# Patient Record
Sex: Male | Born: 1935 | Race: Black or African American | Hispanic: No
Health system: Southern US, Community
[De-identification: ages and names within clinical notes are randomized; demographics above are authoritative.]

## PROBLEM LIST (undated history)

## (undated) DIAGNOSIS — I639 Cerebral infarction, unspecified: Secondary | ICD-10-CM

## (undated) DIAGNOSIS — E785 Hyperlipidemia, unspecified: Secondary | ICD-10-CM

## (undated) DIAGNOSIS — I10 Essential (primary) hypertension: Secondary | ICD-10-CM

## (undated) DIAGNOSIS — I2510 Atherosclerotic heart disease of native coronary artery without angina pectoris: Secondary | ICD-10-CM

## (undated) DIAGNOSIS — N400 Enlarged prostate without lower urinary tract symptoms: Secondary | ICD-10-CM

## (undated) DIAGNOSIS — I219 Acute myocardial infarction, unspecified: Secondary | ICD-10-CM

## (undated) DIAGNOSIS — G8190 Hemiplegia, unspecified affecting unspecified side: Secondary | ICD-10-CM

## (undated) DIAGNOSIS — G9340 Encephalopathy, unspecified: Secondary | ICD-10-CM

## (undated) DIAGNOSIS — I208 Other forms of angina pectoris: Secondary | ICD-10-CM

## (undated) DIAGNOSIS — I2089 Other forms of angina pectoris: Secondary | ICD-10-CM

---

## 2004-06-24 ENCOUNTER — Emergency Department: Payer: Self-pay | Admitting: Emergency Medicine

## 2004-06-24 ENCOUNTER — Other Ambulatory Visit: Payer: Self-pay

## 2004-10-18 ENCOUNTER — Ambulatory Visit: Payer: Self-pay | Admitting: Internal Medicine

## 2005-10-17 ENCOUNTER — Ambulatory Visit: Payer: Self-pay | Admitting: Internal Medicine

## 2010-12-01 ENCOUNTER — Ambulatory Visit: Payer: Self-pay | Admitting: Internal Medicine

## 2010-12-28 ENCOUNTER — Ambulatory Visit: Payer: Self-pay | Admitting: Internal Medicine

## 2013-11-19 ENCOUNTER — Ambulatory Visit: Payer: Self-pay | Admitting: Nephrology

## 2020-08-06 DIAGNOSIS — I2510 Atherosclerotic heart disease of native coronary artery without angina pectoris: Secondary | ICD-10-CM | POA: Diagnosis not present

## 2020-08-06 DIAGNOSIS — I10 Essential (primary) hypertension: Secondary | ICD-10-CM | POA: Diagnosis not present

## 2020-08-06 DIAGNOSIS — E559 Vitamin D deficiency, unspecified: Secondary | ICD-10-CM | POA: Diagnosis not present

## 2020-08-11 DIAGNOSIS — M1389 Other specified arthritis, multiple sites: Secondary | ICD-10-CM | POA: Diagnosis not present

## 2020-08-11 DIAGNOSIS — N181 Chronic kidney disease, stage 1: Secondary | ICD-10-CM | POA: Diagnosis not present

## 2020-08-11 DIAGNOSIS — E785 Hyperlipidemia, unspecified: Secondary | ICD-10-CM | POA: Diagnosis not present

## 2020-08-11 DIAGNOSIS — E559 Vitamin D deficiency, unspecified: Secondary | ICD-10-CM | POA: Diagnosis not present

## 2020-08-11 DIAGNOSIS — J449 Chronic obstructive pulmonary disease, unspecified: Secondary | ICD-10-CM | POA: Diagnosis not present

## 2020-11-26 DIAGNOSIS — H903 Sensorineural hearing loss, bilateral: Secondary | ICD-10-CM | POA: Diagnosis not present

## 2020-12-01 DIAGNOSIS — H905 Unspecified sensorineural hearing loss: Secondary | ICD-10-CM | POA: Diagnosis not present

## 2020-12-03 DIAGNOSIS — N181 Chronic kidney disease, stage 1: Secondary | ICD-10-CM | POA: Diagnosis not present

## 2020-12-03 DIAGNOSIS — E785 Hyperlipidemia, unspecified: Secondary | ICD-10-CM | POA: Diagnosis not present

## 2020-12-03 DIAGNOSIS — E559 Vitamin D deficiency, unspecified: Secondary | ICD-10-CM | POA: Diagnosis not present

## 2020-12-08 DIAGNOSIS — N181 Chronic kidney disease, stage 1: Secondary | ICD-10-CM | POA: Diagnosis not present

## 2020-12-08 DIAGNOSIS — M1389 Other specified arthritis, multiple sites: Secondary | ICD-10-CM | POA: Diagnosis not present

## 2020-12-08 DIAGNOSIS — M179 Osteoarthritis of knee, unspecified: Secondary | ICD-10-CM | POA: Diagnosis not present

## 2020-12-08 DIAGNOSIS — E559 Vitamin D deficiency, unspecified: Secondary | ICD-10-CM | POA: Diagnosis not present

## 2020-12-08 DIAGNOSIS — E785 Hyperlipidemia, unspecified: Secondary | ICD-10-CM | POA: Diagnosis not present

## 2020-12-25 DIAGNOSIS — Z20822 Contact with and (suspected) exposure to covid-19: Secondary | ICD-10-CM | POA: Diagnosis not present

## 2020-12-25 DIAGNOSIS — Z9189 Other specified personal risk factors, not elsewhere classified: Secondary | ICD-10-CM | POA: Diagnosis not present

## 2020-12-25 DIAGNOSIS — F32A Depression, unspecified: Secondary | ICD-10-CM | POA: Diagnosis not present

## 2020-12-25 DIAGNOSIS — I493 Ventricular premature depolarization: Secondary | ICD-10-CM | POA: Diagnosis not present

## 2020-12-25 DIAGNOSIS — R531 Weakness: Secondary | ICD-10-CM | POA: Diagnosis not present

## 2020-12-25 DIAGNOSIS — I69354 Hemiplegia and hemiparesis following cerebral infarction affecting left non-dominant side: Secondary | ICD-10-CM | POA: Diagnosis not present

## 2020-12-25 DIAGNOSIS — I252 Old myocardial infarction: Secondary | ICD-10-CM | POA: Diagnosis not present

## 2020-12-25 DIAGNOSIS — R451 Restlessness and agitation: Secondary | ICD-10-CM | POA: Diagnosis not present

## 2020-12-25 DIAGNOSIS — I110 Hypertensive heart disease with heart failure: Secondary | ICD-10-CM | POA: Diagnosis not present

## 2020-12-25 DIAGNOSIS — Z79899 Other long term (current) drug therapy: Secondary | ICD-10-CM | POA: Diagnosis not present

## 2020-12-25 DIAGNOSIS — I10 Essential (primary) hypertension: Secondary | ICD-10-CM | POA: Diagnosis not present

## 2020-12-25 DIAGNOSIS — Z9911 Dependence on respirator [ventilator] status: Secondary | ICD-10-CM | POA: Diagnosis not present

## 2020-12-25 DIAGNOSIS — Z7901 Long term (current) use of anticoagulants: Secondary | ICD-10-CM | POA: Diagnosis not present

## 2020-12-25 DIAGNOSIS — I161 Hypertensive emergency: Secondary | ICD-10-CM | POA: Diagnosis not present

## 2020-12-25 DIAGNOSIS — R748 Abnormal levels of other serum enzymes: Secondary | ICD-10-CM | POA: Diagnosis not present

## 2020-12-25 DIAGNOSIS — I248 Other forms of acute ischemic heart disease: Secondary | ICD-10-CM | POA: Diagnosis not present

## 2020-12-25 DIAGNOSIS — R471 Dysarthria and anarthria: Secondary | ICD-10-CM | POA: Diagnosis not present

## 2020-12-25 DIAGNOSIS — E7800 Pure hypercholesterolemia, unspecified: Secondary | ICD-10-CM | POA: Diagnosis not present

## 2020-12-25 DIAGNOSIS — Z9181 History of falling: Secondary | ICD-10-CM | POA: Diagnosis not present

## 2020-12-25 DIAGNOSIS — Z880 Allergy status to penicillin: Secondary | ICD-10-CM | POA: Diagnosis not present

## 2020-12-25 DIAGNOSIS — M6281 Muscle weakness (generalized): Secondary | ICD-10-CM | POA: Diagnosis not present

## 2020-12-25 DIAGNOSIS — G8194 Hemiplegia, unspecified affecting left nondominant side: Secondary | ICD-10-CM | POA: Diagnosis not present

## 2020-12-25 DIAGNOSIS — I5022 Chronic systolic (congestive) heart failure: Secondary | ICD-10-CM | POA: Diagnosis not present

## 2020-12-25 DIAGNOSIS — R29714 NIHSS score 14: Secondary | ICD-10-CM | POA: Diagnosis not present

## 2020-12-25 DIAGNOSIS — R0989 Other specified symptoms and signs involving the circulatory and respiratory systems: Secondary | ICD-10-CM | POA: Diagnosis not present

## 2020-12-25 DIAGNOSIS — Z7982 Long term (current) use of aspirin: Secondary | ICD-10-CM | POA: Diagnosis not present

## 2020-12-25 DIAGNOSIS — I69391 Dysphagia following cerebral infarction: Secondary | ICD-10-CM | POA: Diagnosis not present

## 2020-12-25 DIAGNOSIS — I63411 Cerebral infarction due to embolism of right middle cerebral artery: Secondary | ICD-10-CM | POA: Diagnosis not present

## 2020-12-25 DIAGNOSIS — I25118 Atherosclerotic heart disease of native coronary artery with other forms of angina pectoris: Secondary | ICD-10-CM | POA: Diagnosis not present

## 2020-12-25 DIAGNOSIS — I6601 Occlusion and stenosis of right middle cerebral artery: Secondary | ICD-10-CM | POA: Diagnosis not present

## 2020-12-25 DIAGNOSIS — I214 Non-ST elevation (NSTEMI) myocardial infarction: Secondary | ICD-10-CM | POA: Diagnosis not present

## 2020-12-25 DIAGNOSIS — I253 Aneurysm of heart: Secondary | ICD-10-CM | POA: Diagnosis not present

## 2020-12-25 DIAGNOSIS — I519 Heart disease, unspecified: Secondary | ICD-10-CM | POA: Diagnosis not present

## 2020-12-25 DIAGNOSIS — R5381 Other malaise: Secondary | ICD-10-CM | POA: Diagnosis not present

## 2020-12-25 DIAGNOSIS — I639 Cerebral infarction, unspecified: Secondary | ICD-10-CM | POA: Diagnosis not present

## 2020-12-25 DIAGNOSIS — Z955 Presence of coronary angioplasty implant and graft: Secondary | ICD-10-CM | POA: Diagnosis not present

## 2020-12-25 DIAGNOSIS — R1312 Dysphagia, oropharyngeal phase: Secondary | ICD-10-CM | POA: Diagnosis not present

## 2020-12-25 DIAGNOSIS — J45909 Unspecified asthma, uncomplicated: Secondary | ICD-10-CM | POA: Diagnosis not present

## 2020-12-25 DIAGNOSIS — R6889 Other general symptoms and signs: Secondary | ICD-10-CM | POA: Diagnosis not present

## 2020-12-25 DIAGNOSIS — R410 Disorientation, unspecified: Secondary | ICD-10-CM | POA: Diagnosis not present

## 2020-12-25 DIAGNOSIS — E785 Hyperlipidemia, unspecified: Secondary | ICD-10-CM | POA: Diagnosis not present

## 2020-12-25 DIAGNOSIS — I2510 Atherosclerotic heart disease of native coronary artery without angina pectoris: Secondary | ICD-10-CM | POA: Diagnosis not present

## 2020-12-25 DIAGNOSIS — Z8639 Personal history of other endocrine, nutritional and metabolic disease: Secondary | ICD-10-CM | POA: Diagnosis not present

## 2020-12-25 DIAGNOSIS — R1310 Dysphagia, unspecified: Secondary | ICD-10-CM | POA: Diagnosis not present

## 2020-12-25 DIAGNOSIS — Z743 Need for continuous supervision: Secondary | ICD-10-CM | POA: Diagnosis not present

## 2020-12-25 DIAGNOSIS — I69314 Frontal lobe and executive function deficit following cerebral infarction: Secondary | ICD-10-CM | POA: Diagnosis not present

## 2020-12-25 DIAGNOSIS — I499 Cardiac arrhythmia, unspecified: Secondary | ICD-10-CM | POA: Diagnosis not present

## 2020-12-25 DIAGNOSIS — R29810 Facial weakness: Secondary | ICD-10-CM | POA: Diagnosis not present

## 2020-12-25 DIAGNOSIS — I69398 Other sequelae of cerebral infarction: Secondary | ICD-10-CM | POA: Diagnosis not present

## 2020-12-25 DIAGNOSIS — Z4682 Encounter for fitting and adjustment of non-vascular catheter: Secondary | ICD-10-CM | POA: Diagnosis not present

## 2020-12-25 DIAGNOSIS — Z8679 Personal history of other diseases of the circulatory system: Secondary | ICD-10-CM | POA: Diagnosis not present

## 2020-12-25 DIAGNOSIS — R2689 Other abnormalities of gait and mobility: Secondary | ICD-10-CM | POA: Diagnosis not present

## 2020-12-25 DIAGNOSIS — I726 Aneurysm of vertebral artery: Secondary | ICD-10-CM | POA: Diagnosis not present

## 2020-12-25 DIAGNOSIS — I213 ST elevation (STEMI) myocardial infarction of unspecified site: Secondary | ICD-10-CM | POA: Diagnosis not present

## 2020-12-25 DIAGNOSIS — R9431 Abnormal electrocardiogram [ECG] [EKG]: Secondary | ICD-10-CM | POA: Diagnosis not present

## 2020-12-25 DIAGNOSIS — Z789 Other specified health status: Secondary | ICD-10-CM | POA: Diagnosis not present

## 2020-12-25 DIAGNOSIS — I509 Heart failure, unspecified: Secondary | ICD-10-CM | POA: Diagnosis not present

## 2020-12-25 DIAGNOSIS — I63511 Cerebral infarction due to unspecified occlusion or stenosis of right middle cerebral artery: Secondary | ICD-10-CM | POA: Diagnosis not present

## 2021-01-07 DIAGNOSIS — Z743 Need for continuous supervision: Secondary | ICD-10-CM | POA: Diagnosis not present

## 2021-01-07 DIAGNOSIS — I214 Non-ST elevation (NSTEMI) myocardial infarction: Secondary | ICD-10-CM | POA: Diagnosis not present

## 2021-01-07 DIAGNOSIS — J449 Chronic obstructive pulmonary disease, unspecified: Secondary | ICD-10-CM | POA: Diagnosis not present

## 2021-01-07 DIAGNOSIS — R970 Elevated carcinoembryonic antigen [CEA]: Secondary | ICD-10-CM | POA: Diagnosis not present

## 2021-01-07 DIAGNOSIS — F32A Depression, unspecified: Secondary | ICD-10-CM | POA: Diagnosis not present

## 2021-01-07 DIAGNOSIS — I69354 Hemiplegia and hemiparesis following cerebral infarction affecting left non-dominant side: Secondary | ICD-10-CM | POA: Diagnosis not present

## 2021-01-07 DIAGNOSIS — G9340 Encephalopathy, unspecified: Secondary | ICD-10-CM | POA: Diagnosis not present

## 2021-01-07 DIAGNOSIS — E7800 Pure hypercholesterolemia, unspecified: Secondary | ICD-10-CM | POA: Diagnosis not present

## 2021-01-07 DIAGNOSIS — R799 Abnormal finding of blood chemistry, unspecified: Secondary | ICD-10-CM | POA: Diagnosis not present

## 2021-01-07 DIAGNOSIS — I10 Essential (primary) hypertension: Secondary | ICD-10-CM | POA: Diagnosis not present

## 2021-01-07 DIAGNOSIS — R7989 Other specified abnormal findings of blood chemistry: Secondary | ICD-10-CM | POA: Diagnosis not present

## 2021-01-07 DIAGNOSIS — E039 Hypothyroidism, unspecified: Secondary | ICD-10-CM | POA: Diagnosis not present

## 2021-01-07 DIAGNOSIS — I25118 Atherosclerotic heart disease of native coronary artery with other forms of angina pectoris: Secondary | ICD-10-CM | POA: Diagnosis not present

## 2021-01-07 DIAGNOSIS — M6281 Muscle weakness (generalized): Secondary | ICD-10-CM | POA: Diagnosis not present

## 2021-01-07 DIAGNOSIS — I69314 Frontal lobe and executive function deficit following cerebral infarction: Secondary | ICD-10-CM | POA: Diagnosis not present

## 2021-01-07 DIAGNOSIS — R410 Disorientation, unspecified: Secondary | ICD-10-CM | POA: Diagnosis not present

## 2021-01-07 DIAGNOSIS — I69398 Other sequelae of cerebral infarction: Secondary | ICD-10-CM | POA: Diagnosis not present

## 2021-01-07 DIAGNOSIS — I63511 Cerebral infarction due to unspecified occlusion or stenosis of right middle cerebral artery: Secondary | ICD-10-CM | POA: Diagnosis not present

## 2021-01-07 DIAGNOSIS — I509 Heart failure, unspecified: Secondary | ICD-10-CM | POA: Diagnosis not present

## 2021-01-07 DIAGNOSIS — G8194 Hemiplegia, unspecified affecting left nondominant side: Secondary | ICD-10-CM | POA: Diagnosis not present

## 2021-01-07 DIAGNOSIS — Z7982 Long term (current) use of aspirin: Secondary | ICD-10-CM | POA: Diagnosis not present

## 2021-01-07 DIAGNOSIS — I69391 Dysphagia following cerebral infarction: Secondary | ICD-10-CM | POA: Diagnosis not present

## 2021-01-07 DIAGNOSIS — R0989 Other specified symptoms and signs involving the circulatory and respiratory systems: Secondary | ICD-10-CM | POA: Diagnosis not present

## 2021-01-07 DIAGNOSIS — R1312 Dysphagia, oropharyngeal phase: Secondary | ICD-10-CM | POA: Diagnosis not present

## 2021-01-08 DIAGNOSIS — I25118 Atherosclerotic heart disease of native coronary artery with other forms of angina pectoris: Secondary | ICD-10-CM | POA: Diagnosis not present

## 2021-01-08 DIAGNOSIS — G8194 Hemiplegia, unspecified affecting left nondominant side: Secondary | ICD-10-CM | POA: Insufficient documentation

## 2021-01-11 DIAGNOSIS — G9340 Encephalopathy, unspecified: Secondary | ICD-10-CM | POA: Diagnosis not present

## 2021-01-11 DIAGNOSIS — G8194 Hemiplegia, unspecified affecting left nondominant side: Secondary | ICD-10-CM | POA: Diagnosis not present

## 2021-01-29 DIAGNOSIS — I10 Essential (primary) hypertension: Secondary | ICD-10-CM | POA: Diagnosis not present

## 2021-01-29 DIAGNOSIS — M6281 Muscle weakness (generalized): Secondary | ICD-10-CM | POA: Diagnosis not present

## 2021-01-29 DIAGNOSIS — I69314 Frontal lobe and executive function deficit following cerebral infarction: Secondary | ICD-10-CM | POA: Diagnosis not present

## 2021-01-29 DIAGNOSIS — I69828 Other speech and language deficits following other cerebrovascular disease: Secondary | ICD-10-CM | POA: Diagnosis not present

## 2021-01-29 DIAGNOSIS — I69354 Hemiplegia and hemiparesis following cerebral infarction affecting left non-dominant side: Secondary | ICD-10-CM | POA: Diagnosis not present

## 2021-01-29 DIAGNOSIS — I69322 Dysarthria following cerebral infarction: Secondary | ICD-10-CM | POA: Diagnosis not present

## 2021-01-29 DIAGNOSIS — I69398 Other sequelae of cerebral infarction: Secondary | ICD-10-CM | POA: Diagnosis not present

## 2021-01-29 DIAGNOSIS — R1312 Dysphagia, oropharyngeal phase: Secondary | ICD-10-CM | POA: Diagnosis not present

## 2021-01-29 DIAGNOSIS — I509 Heart failure, unspecified: Secondary | ICD-10-CM | POA: Diagnosis not present

## 2021-01-29 DIAGNOSIS — I69391 Dysphagia following cerebral infarction: Secondary | ICD-10-CM | POA: Diagnosis not present

## 2021-02-01 DIAGNOSIS — I69398 Other sequelae of cerebral infarction: Secondary | ICD-10-CM | POA: Diagnosis not present

## 2021-02-01 DIAGNOSIS — I509 Heart failure, unspecified: Secondary | ICD-10-CM | POA: Diagnosis not present

## 2021-02-01 DIAGNOSIS — I69322 Dysarthria following cerebral infarction: Secondary | ICD-10-CM | POA: Diagnosis not present

## 2021-02-01 DIAGNOSIS — I69314 Frontal lobe and executive function deficit following cerebral infarction: Secondary | ICD-10-CM | POA: Diagnosis not present

## 2021-02-01 DIAGNOSIS — M6281 Muscle weakness (generalized): Secondary | ICD-10-CM | POA: Diagnosis not present

## 2021-02-01 DIAGNOSIS — R1312 Dysphagia, oropharyngeal phase: Secondary | ICD-10-CM | POA: Diagnosis not present

## 2021-02-01 DIAGNOSIS — I10 Essential (primary) hypertension: Secondary | ICD-10-CM | POA: Diagnosis not present

## 2021-02-01 DIAGNOSIS — I69391 Dysphagia following cerebral infarction: Secondary | ICD-10-CM | POA: Diagnosis not present

## 2021-02-01 DIAGNOSIS — I69354 Hemiplegia and hemiparesis following cerebral infarction affecting left non-dominant side: Secondary | ICD-10-CM | POA: Diagnosis not present

## 2021-02-01 DIAGNOSIS — I69828 Other speech and language deficits following other cerebrovascular disease: Secondary | ICD-10-CM | POA: Diagnosis not present

## 2021-02-02 DIAGNOSIS — I69322 Dysarthria following cerebral infarction: Secondary | ICD-10-CM | POA: Diagnosis not present

## 2021-02-02 DIAGNOSIS — I69398 Other sequelae of cerebral infarction: Secondary | ICD-10-CM | POA: Diagnosis not present

## 2021-02-02 DIAGNOSIS — I69391 Dysphagia following cerebral infarction: Secondary | ICD-10-CM | POA: Diagnosis not present

## 2021-02-02 DIAGNOSIS — I10 Essential (primary) hypertension: Secondary | ICD-10-CM | POA: Diagnosis not present

## 2021-02-02 DIAGNOSIS — I739 Peripheral vascular disease, unspecified: Secondary | ICD-10-CM | POA: Diagnosis not present

## 2021-02-02 DIAGNOSIS — I509 Heart failure, unspecified: Secondary | ICD-10-CM | POA: Diagnosis not present

## 2021-02-02 DIAGNOSIS — I69354 Hemiplegia and hemiparesis following cerebral infarction affecting left non-dominant side: Secondary | ICD-10-CM | POA: Diagnosis not present

## 2021-02-02 DIAGNOSIS — R1312 Dysphagia, oropharyngeal phase: Secondary | ICD-10-CM | POA: Diagnosis not present

## 2021-02-02 DIAGNOSIS — M6281 Muscle weakness (generalized): Secondary | ICD-10-CM | POA: Diagnosis not present

## 2021-02-02 DIAGNOSIS — I69828 Other speech and language deficits following other cerebrovascular disease: Secondary | ICD-10-CM | POA: Diagnosis not present

## 2021-02-02 DIAGNOSIS — N1831 Chronic kidney disease, stage 3a: Secondary | ICD-10-CM | POA: Diagnosis not present

## 2021-02-02 DIAGNOSIS — K219 Gastro-esophageal reflux disease without esophagitis: Secondary | ICD-10-CM | POA: Diagnosis not present

## 2021-02-02 DIAGNOSIS — I69314 Frontal lobe and executive function deficit following cerebral infarction: Secondary | ICD-10-CM | POA: Diagnosis not present

## 2021-02-08 DIAGNOSIS — I69398 Other sequelae of cerebral infarction: Secondary | ICD-10-CM | POA: Diagnosis not present

## 2021-02-08 DIAGNOSIS — I69354 Hemiplegia and hemiparesis following cerebral infarction affecting left non-dominant side: Secondary | ICD-10-CM | POA: Diagnosis not present

## 2021-02-08 DIAGNOSIS — I69391 Dysphagia following cerebral infarction: Secondary | ICD-10-CM | POA: Diagnosis not present

## 2021-02-08 DIAGNOSIS — R1312 Dysphagia, oropharyngeal phase: Secondary | ICD-10-CM | POA: Diagnosis not present

## 2021-02-08 DIAGNOSIS — I69828 Other speech and language deficits following other cerebrovascular disease: Secondary | ICD-10-CM | POA: Diagnosis not present

## 2021-02-08 DIAGNOSIS — I509 Heart failure, unspecified: Secondary | ICD-10-CM | POA: Diagnosis not present

## 2021-02-08 DIAGNOSIS — M6281 Muscle weakness (generalized): Secondary | ICD-10-CM | POA: Diagnosis not present

## 2021-02-08 DIAGNOSIS — I69314 Frontal lobe and executive function deficit following cerebral infarction: Secondary | ICD-10-CM | POA: Diagnosis not present

## 2021-02-08 DIAGNOSIS — I69322 Dysarthria following cerebral infarction: Secondary | ICD-10-CM | POA: Diagnosis not present

## 2021-02-08 DIAGNOSIS — I10 Essential (primary) hypertension: Secondary | ICD-10-CM | POA: Diagnosis not present

## 2021-02-09 DIAGNOSIS — R1312 Dysphagia, oropharyngeal phase: Secondary | ICD-10-CM | POA: Diagnosis not present

## 2021-02-09 DIAGNOSIS — I509 Heart failure, unspecified: Secondary | ICD-10-CM | POA: Diagnosis not present

## 2021-02-09 DIAGNOSIS — I69354 Hemiplegia and hemiparesis following cerebral infarction affecting left non-dominant side: Secondary | ICD-10-CM | POA: Diagnosis not present

## 2021-02-09 DIAGNOSIS — M6281 Muscle weakness (generalized): Secondary | ICD-10-CM | POA: Diagnosis not present

## 2021-02-09 DIAGNOSIS — I69391 Dysphagia following cerebral infarction: Secondary | ICD-10-CM | POA: Diagnosis not present

## 2021-02-09 DIAGNOSIS — I10 Essential (primary) hypertension: Secondary | ICD-10-CM | POA: Diagnosis not present

## 2021-02-09 DIAGNOSIS — I69828 Other speech and language deficits following other cerebrovascular disease: Secondary | ICD-10-CM | POA: Diagnosis not present

## 2021-02-09 DIAGNOSIS — I69322 Dysarthria following cerebral infarction: Secondary | ICD-10-CM | POA: Diagnosis not present

## 2021-02-09 DIAGNOSIS — I69398 Other sequelae of cerebral infarction: Secondary | ICD-10-CM | POA: Diagnosis not present

## 2021-02-09 DIAGNOSIS — I69314 Frontal lobe and executive function deficit following cerebral infarction: Secondary | ICD-10-CM | POA: Diagnosis not present

## 2021-02-10 DIAGNOSIS — I69354 Hemiplegia and hemiparesis following cerebral infarction affecting left non-dominant side: Secondary | ICD-10-CM | POA: Diagnosis not present

## 2021-02-10 DIAGNOSIS — I69391 Dysphagia following cerebral infarction: Secondary | ICD-10-CM | POA: Diagnosis not present

## 2021-02-10 DIAGNOSIS — R1312 Dysphagia, oropharyngeal phase: Secondary | ICD-10-CM | POA: Diagnosis not present

## 2021-02-10 DIAGNOSIS — I69398 Other sequelae of cerebral infarction: Secondary | ICD-10-CM | POA: Diagnosis not present

## 2021-02-10 DIAGNOSIS — I69314 Frontal lobe and executive function deficit following cerebral infarction: Secondary | ICD-10-CM | POA: Diagnosis not present

## 2021-02-10 DIAGNOSIS — M6281 Muscle weakness (generalized): Secondary | ICD-10-CM | POA: Diagnosis not present

## 2021-02-10 DIAGNOSIS — I509 Heart failure, unspecified: Secondary | ICD-10-CM | POA: Diagnosis not present

## 2021-02-10 DIAGNOSIS — I10 Essential (primary) hypertension: Secondary | ICD-10-CM | POA: Diagnosis not present

## 2021-02-10 DIAGNOSIS — I69828 Other speech and language deficits following other cerebrovascular disease: Secondary | ICD-10-CM | POA: Diagnosis not present

## 2021-02-10 DIAGNOSIS — I69322 Dysarthria following cerebral infarction: Secondary | ICD-10-CM | POA: Diagnosis not present

## 2021-02-11 DIAGNOSIS — I509 Heart failure, unspecified: Secondary | ICD-10-CM | POA: Diagnosis not present

## 2021-02-11 DIAGNOSIS — I69828 Other speech and language deficits following other cerebrovascular disease: Secondary | ICD-10-CM | POA: Diagnosis not present

## 2021-02-11 DIAGNOSIS — I10 Essential (primary) hypertension: Secondary | ICD-10-CM | POA: Diagnosis not present

## 2021-02-11 DIAGNOSIS — I69322 Dysarthria following cerebral infarction: Secondary | ICD-10-CM | POA: Diagnosis not present

## 2021-02-11 DIAGNOSIS — I69354 Hemiplegia and hemiparesis following cerebral infarction affecting left non-dominant side: Secondary | ICD-10-CM | POA: Diagnosis not present

## 2021-02-11 DIAGNOSIS — I69314 Frontal lobe and executive function deficit following cerebral infarction: Secondary | ICD-10-CM | POA: Diagnosis not present

## 2021-02-11 DIAGNOSIS — R1312 Dysphagia, oropharyngeal phase: Secondary | ICD-10-CM | POA: Diagnosis not present

## 2021-02-11 DIAGNOSIS — M6281 Muscle weakness (generalized): Secondary | ICD-10-CM | POA: Diagnosis not present

## 2021-02-11 DIAGNOSIS — I69398 Other sequelae of cerebral infarction: Secondary | ICD-10-CM | POA: Diagnosis not present

## 2021-02-11 DIAGNOSIS — I69391 Dysphagia following cerebral infarction: Secondary | ICD-10-CM | POA: Diagnosis not present

## 2021-02-12 DIAGNOSIS — I69828 Other speech and language deficits following other cerebrovascular disease: Secondary | ICD-10-CM | POA: Diagnosis not present

## 2021-02-12 DIAGNOSIS — I69391 Dysphagia following cerebral infarction: Secondary | ICD-10-CM | POA: Diagnosis not present

## 2021-02-12 DIAGNOSIS — I509 Heart failure, unspecified: Secondary | ICD-10-CM | POA: Diagnosis not present

## 2021-02-12 DIAGNOSIS — I69398 Other sequelae of cerebral infarction: Secondary | ICD-10-CM | POA: Diagnosis not present

## 2021-02-12 DIAGNOSIS — I10 Essential (primary) hypertension: Secondary | ICD-10-CM | POA: Diagnosis not present

## 2021-02-12 DIAGNOSIS — M6281 Muscle weakness (generalized): Secondary | ICD-10-CM | POA: Diagnosis not present

## 2021-02-12 DIAGNOSIS — R1312 Dysphagia, oropharyngeal phase: Secondary | ICD-10-CM | POA: Diagnosis not present

## 2021-02-12 DIAGNOSIS — I69354 Hemiplegia and hemiparesis following cerebral infarction affecting left non-dominant side: Secondary | ICD-10-CM | POA: Diagnosis not present

## 2021-02-12 DIAGNOSIS — I69314 Frontal lobe and executive function deficit following cerebral infarction: Secondary | ICD-10-CM | POA: Diagnosis not present

## 2021-02-12 DIAGNOSIS — I69322 Dysarthria following cerebral infarction: Secondary | ICD-10-CM | POA: Diagnosis not present

## 2021-02-15 DIAGNOSIS — I69354 Hemiplegia and hemiparesis following cerebral infarction affecting left non-dominant side: Secondary | ICD-10-CM | POA: Diagnosis not present

## 2021-02-15 DIAGNOSIS — I69398 Other sequelae of cerebral infarction: Secondary | ICD-10-CM | POA: Diagnosis not present

## 2021-02-15 DIAGNOSIS — I509 Heart failure, unspecified: Secondary | ICD-10-CM | POA: Diagnosis not present

## 2021-02-15 DIAGNOSIS — I10 Essential (primary) hypertension: Secondary | ICD-10-CM | POA: Diagnosis not present

## 2021-02-15 DIAGNOSIS — R1312 Dysphagia, oropharyngeal phase: Secondary | ICD-10-CM | POA: Diagnosis not present

## 2021-02-15 DIAGNOSIS — I69828 Other speech and language deficits following other cerebrovascular disease: Secondary | ICD-10-CM | POA: Diagnosis not present

## 2021-02-15 DIAGNOSIS — I69322 Dysarthria following cerebral infarction: Secondary | ICD-10-CM | POA: Diagnosis not present

## 2021-02-15 DIAGNOSIS — I69314 Frontal lobe and executive function deficit following cerebral infarction: Secondary | ICD-10-CM | POA: Diagnosis not present

## 2021-02-15 DIAGNOSIS — I69391 Dysphagia following cerebral infarction: Secondary | ICD-10-CM | POA: Diagnosis not present

## 2021-02-15 DIAGNOSIS — M6281 Muscle weakness (generalized): Secondary | ICD-10-CM | POA: Diagnosis not present

## 2021-02-16 DIAGNOSIS — I69314 Frontal lobe and executive function deficit following cerebral infarction: Secondary | ICD-10-CM | POA: Diagnosis not present

## 2021-02-16 DIAGNOSIS — I69828 Other speech and language deficits following other cerebrovascular disease: Secondary | ICD-10-CM | POA: Diagnosis not present

## 2021-02-16 DIAGNOSIS — I69354 Hemiplegia and hemiparesis following cerebral infarction affecting left non-dominant side: Secondary | ICD-10-CM | POA: Diagnosis not present

## 2021-02-16 DIAGNOSIS — I69391 Dysphagia following cerebral infarction: Secondary | ICD-10-CM | POA: Diagnosis not present

## 2021-02-16 DIAGNOSIS — I69398 Other sequelae of cerebral infarction: Secondary | ICD-10-CM | POA: Diagnosis not present

## 2021-02-16 DIAGNOSIS — I509 Heart failure, unspecified: Secondary | ICD-10-CM | POA: Diagnosis not present

## 2021-02-16 DIAGNOSIS — R1312 Dysphagia, oropharyngeal phase: Secondary | ICD-10-CM | POA: Diagnosis not present

## 2021-02-16 DIAGNOSIS — I10 Essential (primary) hypertension: Secondary | ICD-10-CM | POA: Diagnosis not present

## 2021-02-16 DIAGNOSIS — M6281 Muscle weakness (generalized): Secondary | ICD-10-CM | POA: Diagnosis not present

## 2021-02-16 DIAGNOSIS — I69322 Dysarthria following cerebral infarction: Secondary | ICD-10-CM | POA: Diagnosis not present

## 2021-02-17 DIAGNOSIS — I69398 Other sequelae of cerebral infarction: Secondary | ICD-10-CM | POA: Diagnosis not present

## 2021-02-17 DIAGNOSIS — I10 Essential (primary) hypertension: Secondary | ICD-10-CM | POA: Diagnosis not present

## 2021-02-17 DIAGNOSIS — I69354 Hemiplegia and hemiparesis following cerebral infarction affecting left non-dominant side: Secondary | ICD-10-CM | POA: Diagnosis not present

## 2021-02-17 DIAGNOSIS — I69314 Frontal lobe and executive function deficit following cerebral infarction: Secondary | ICD-10-CM | POA: Diagnosis not present

## 2021-02-17 DIAGNOSIS — I69322 Dysarthria following cerebral infarction: Secondary | ICD-10-CM | POA: Diagnosis not present

## 2021-02-17 DIAGNOSIS — R1312 Dysphagia, oropharyngeal phase: Secondary | ICD-10-CM | POA: Diagnosis not present

## 2021-02-17 DIAGNOSIS — M6281 Muscle weakness (generalized): Secondary | ICD-10-CM | POA: Diagnosis not present

## 2021-02-17 DIAGNOSIS — I509 Heart failure, unspecified: Secondary | ICD-10-CM | POA: Diagnosis not present

## 2021-02-17 DIAGNOSIS — I69391 Dysphagia following cerebral infarction: Secondary | ICD-10-CM | POA: Diagnosis not present

## 2021-02-17 DIAGNOSIS — I69828 Other speech and language deficits following other cerebrovascular disease: Secondary | ICD-10-CM | POA: Diagnosis not present

## 2021-02-18 DIAGNOSIS — M6281 Muscle weakness (generalized): Secondary | ICD-10-CM | POA: Diagnosis not present

## 2021-02-18 DIAGNOSIS — I69322 Dysarthria following cerebral infarction: Secondary | ICD-10-CM | POA: Diagnosis not present

## 2021-02-18 DIAGNOSIS — I69398 Other sequelae of cerebral infarction: Secondary | ICD-10-CM | POA: Diagnosis not present

## 2021-02-18 DIAGNOSIS — I69828 Other speech and language deficits following other cerebrovascular disease: Secondary | ICD-10-CM | POA: Diagnosis not present

## 2021-02-18 DIAGNOSIS — I69314 Frontal lobe and executive function deficit following cerebral infarction: Secondary | ICD-10-CM | POA: Diagnosis not present

## 2021-02-18 DIAGNOSIS — I509 Heart failure, unspecified: Secondary | ICD-10-CM | POA: Diagnosis not present

## 2021-02-18 DIAGNOSIS — I10 Essential (primary) hypertension: Secondary | ICD-10-CM | POA: Diagnosis not present

## 2021-02-18 DIAGNOSIS — I69354 Hemiplegia and hemiparesis following cerebral infarction affecting left non-dominant side: Secondary | ICD-10-CM | POA: Diagnosis not present

## 2021-02-18 DIAGNOSIS — R1312 Dysphagia, oropharyngeal phase: Secondary | ICD-10-CM | POA: Diagnosis not present

## 2021-02-18 DIAGNOSIS — I69391 Dysphagia following cerebral infarction: Secondary | ICD-10-CM | POA: Diagnosis not present

## 2021-02-19 DIAGNOSIS — I69828 Other speech and language deficits following other cerebrovascular disease: Secondary | ICD-10-CM | POA: Diagnosis not present

## 2021-02-19 DIAGNOSIS — M6281 Muscle weakness (generalized): Secondary | ICD-10-CM | POA: Diagnosis not present

## 2021-02-19 DIAGNOSIS — I10 Essential (primary) hypertension: Secondary | ICD-10-CM | POA: Diagnosis not present

## 2021-02-19 DIAGNOSIS — I69398 Other sequelae of cerebral infarction: Secondary | ICD-10-CM | POA: Diagnosis not present

## 2021-02-19 DIAGNOSIS — I69322 Dysarthria following cerebral infarction: Secondary | ICD-10-CM | POA: Diagnosis not present

## 2021-02-19 DIAGNOSIS — I69354 Hemiplegia and hemiparesis following cerebral infarction affecting left non-dominant side: Secondary | ICD-10-CM | POA: Diagnosis not present

## 2021-02-19 DIAGNOSIS — I509 Heart failure, unspecified: Secondary | ICD-10-CM | POA: Diagnosis not present

## 2021-02-19 DIAGNOSIS — I69391 Dysphagia following cerebral infarction: Secondary | ICD-10-CM | POA: Diagnosis not present

## 2021-02-19 DIAGNOSIS — I69314 Frontal lobe and executive function deficit following cerebral infarction: Secondary | ICD-10-CM | POA: Diagnosis not present

## 2021-02-19 DIAGNOSIS — R1312 Dysphagia, oropharyngeal phase: Secondary | ICD-10-CM | POA: Diagnosis not present

## 2021-02-22 DIAGNOSIS — I69391 Dysphagia following cerebral infarction: Secondary | ICD-10-CM | POA: Diagnosis not present

## 2021-02-22 DIAGNOSIS — I69398 Other sequelae of cerebral infarction: Secondary | ICD-10-CM | POA: Diagnosis not present

## 2021-02-22 DIAGNOSIS — I69314 Frontal lobe and executive function deficit following cerebral infarction: Secondary | ICD-10-CM | POA: Diagnosis not present

## 2021-02-22 DIAGNOSIS — I69322 Dysarthria following cerebral infarction: Secondary | ICD-10-CM | POA: Diagnosis not present

## 2021-02-22 DIAGNOSIS — M6281 Muscle weakness (generalized): Secondary | ICD-10-CM | POA: Diagnosis not present

## 2021-02-22 DIAGNOSIS — I69828 Other speech and language deficits following other cerebrovascular disease: Secondary | ICD-10-CM | POA: Diagnosis not present

## 2021-02-22 DIAGNOSIS — I509 Heart failure, unspecified: Secondary | ICD-10-CM | POA: Diagnosis not present

## 2021-02-22 DIAGNOSIS — I69354 Hemiplegia and hemiparesis following cerebral infarction affecting left non-dominant side: Secondary | ICD-10-CM | POA: Diagnosis not present

## 2021-02-22 DIAGNOSIS — I10 Essential (primary) hypertension: Secondary | ICD-10-CM | POA: Diagnosis not present

## 2021-02-22 DIAGNOSIS — R1312 Dysphagia, oropharyngeal phase: Secondary | ICD-10-CM | POA: Diagnosis not present

## 2021-02-23 DIAGNOSIS — I69354 Hemiplegia and hemiparesis following cerebral infarction affecting left non-dominant side: Secondary | ICD-10-CM | POA: Diagnosis not present

## 2021-02-23 DIAGNOSIS — I69314 Frontal lobe and executive function deficit following cerebral infarction: Secondary | ICD-10-CM | POA: Diagnosis not present

## 2021-02-23 DIAGNOSIS — I69828 Other speech and language deficits following other cerebrovascular disease: Secondary | ICD-10-CM | POA: Diagnosis not present

## 2021-02-23 DIAGNOSIS — I69398 Other sequelae of cerebral infarction: Secondary | ICD-10-CM | POA: Diagnosis not present

## 2021-02-23 DIAGNOSIS — I69322 Dysarthria following cerebral infarction: Secondary | ICD-10-CM | POA: Diagnosis not present

## 2021-02-23 DIAGNOSIS — I10 Essential (primary) hypertension: Secondary | ICD-10-CM | POA: Diagnosis not present

## 2021-02-23 DIAGNOSIS — R1312 Dysphagia, oropharyngeal phase: Secondary | ICD-10-CM | POA: Diagnosis not present

## 2021-02-23 DIAGNOSIS — M6281 Muscle weakness (generalized): Secondary | ICD-10-CM | POA: Diagnosis not present

## 2021-02-23 DIAGNOSIS — I69391 Dysphagia following cerebral infarction: Secondary | ICD-10-CM | POA: Diagnosis not present

## 2021-02-23 DIAGNOSIS — I509 Heart failure, unspecified: Secondary | ICD-10-CM | POA: Diagnosis not present

## 2021-02-24 DIAGNOSIS — I10 Essential (primary) hypertension: Secondary | ICD-10-CM | POA: Diagnosis not present

## 2021-02-24 DIAGNOSIS — M6281 Muscle weakness (generalized): Secondary | ICD-10-CM | POA: Diagnosis not present

## 2021-02-24 DIAGNOSIS — I509 Heart failure, unspecified: Secondary | ICD-10-CM | POA: Diagnosis not present

## 2021-02-24 DIAGNOSIS — R1312 Dysphagia, oropharyngeal phase: Secondary | ICD-10-CM | POA: Diagnosis not present

## 2021-02-24 DIAGNOSIS — I69398 Other sequelae of cerebral infarction: Secondary | ICD-10-CM | POA: Diagnosis not present

## 2021-02-24 DIAGNOSIS — I69391 Dysphagia following cerebral infarction: Secondary | ICD-10-CM | POA: Diagnosis not present

## 2021-02-24 DIAGNOSIS — I69828 Other speech and language deficits following other cerebrovascular disease: Secondary | ICD-10-CM | POA: Diagnosis not present

## 2021-02-24 DIAGNOSIS — I69314 Frontal lobe and executive function deficit following cerebral infarction: Secondary | ICD-10-CM | POA: Diagnosis not present

## 2021-02-24 DIAGNOSIS — I69322 Dysarthria following cerebral infarction: Secondary | ICD-10-CM | POA: Diagnosis not present

## 2021-02-24 DIAGNOSIS — I69354 Hemiplegia and hemiparesis following cerebral infarction affecting left non-dominant side: Secondary | ICD-10-CM | POA: Diagnosis not present

## 2021-02-25 DIAGNOSIS — I69314 Frontal lobe and executive function deficit following cerebral infarction: Secondary | ICD-10-CM | POA: Diagnosis not present

## 2021-02-25 DIAGNOSIS — R1312 Dysphagia, oropharyngeal phase: Secondary | ICD-10-CM | POA: Diagnosis not present

## 2021-02-25 DIAGNOSIS — I69398 Other sequelae of cerebral infarction: Secondary | ICD-10-CM | POA: Diagnosis not present

## 2021-02-25 DIAGNOSIS — E119 Type 2 diabetes mellitus without complications: Secondary | ICD-10-CM | POA: Diagnosis not present

## 2021-02-25 DIAGNOSIS — I69354 Hemiplegia and hemiparesis following cerebral infarction affecting left non-dominant side: Secondary | ICD-10-CM | POA: Diagnosis not present

## 2021-02-25 DIAGNOSIS — I509 Heart failure, unspecified: Secondary | ICD-10-CM | POA: Diagnosis not present

## 2021-02-25 DIAGNOSIS — I69828 Other speech and language deficits following other cerebrovascular disease: Secondary | ICD-10-CM | POA: Diagnosis not present

## 2021-02-25 DIAGNOSIS — I69322 Dysarthria following cerebral infarction: Secondary | ICD-10-CM | POA: Diagnosis not present

## 2021-02-25 DIAGNOSIS — E7849 Other hyperlipidemia: Secondary | ICD-10-CM | POA: Diagnosis not present

## 2021-02-25 DIAGNOSIS — E559 Vitamin D deficiency, unspecified: Secondary | ICD-10-CM | POA: Diagnosis not present

## 2021-02-25 DIAGNOSIS — D518 Other vitamin B12 deficiency anemias: Secondary | ICD-10-CM | POA: Diagnosis not present

## 2021-02-25 DIAGNOSIS — I10 Essential (primary) hypertension: Secondary | ICD-10-CM | POA: Diagnosis not present

## 2021-02-25 DIAGNOSIS — I69391 Dysphagia following cerebral infarction: Secondary | ICD-10-CM | POA: Diagnosis not present

## 2021-02-25 DIAGNOSIS — M6281 Muscle weakness (generalized): Secondary | ICD-10-CM | POA: Diagnosis not present

## 2021-02-25 DIAGNOSIS — Z79899 Other long term (current) drug therapy: Secondary | ICD-10-CM | POA: Diagnosis not present

## 2021-02-26 DIAGNOSIS — I69322 Dysarthria following cerebral infarction: Secondary | ICD-10-CM | POA: Diagnosis not present

## 2021-02-26 DIAGNOSIS — R1312 Dysphagia, oropharyngeal phase: Secondary | ICD-10-CM | POA: Diagnosis not present

## 2021-02-26 DIAGNOSIS — I69828 Other speech and language deficits following other cerebrovascular disease: Secondary | ICD-10-CM | POA: Diagnosis not present

## 2021-02-26 DIAGNOSIS — I69354 Hemiplegia and hemiparesis following cerebral infarction affecting left non-dominant side: Secondary | ICD-10-CM | POA: Diagnosis not present

## 2021-02-26 DIAGNOSIS — I509 Heart failure, unspecified: Secondary | ICD-10-CM | POA: Diagnosis not present

## 2021-02-26 DIAGNOSIS — I69398 Other sequelae of cerebral infarction: Secondary | ICD-10-CM | POA: Diagnosis not present

## 2021-02-26 DIAGNOSIS — I69314 Frontal lobe and executive function deficit following cerebral infarction: Secondary | ICD-10-CM | POA: Diagnosis not present

## 2021-02-26 DIAGNOSIS — I10 Essential (primary) hypertension: Secondary | ICD-10-CM | POA: Diagnosis not present

## 2021-02-26 DIAGNOSIS — M6281 Muscle weakness (generalized): Secondary | ICD-10-CM | POA: Diagnosis not present

## 2021-02-26 DIAGNOSIS — J449 Chronic obstructive pulmonary disease, unspecified: Secondary | ICD-10-CM | POA: Diagnosis not present

## 2021-02-26 DIAGNOSIS — I69391 Dysphagia following cerebral infarction: Secondary | ICD-10-CM | POA: Diagnosis not present

## 2021-03-01 DIAGNOSIS — I69314 Frontal lobe and executive function deficit following cerebral infarction: Secondary | ICD-10-CM | POA: Diagnosis not present

## 2021-03-01 DIAGNOSIS — I509 Heart failure, unspecified: Secondary | ICD-10-CM | POA: Diagnosis not present

## 2021-03-01 DIAGNOSIS — R1312 Dysphagia, oropharyngeal phase: Secondary | ICD-10-CM | POA: Diagnosis not present

## 2021-03-01 DIAGNOSIS — I69354 Hemiplegia and hemiparesis following cerebral infarction affecting left non-dominant side: Secondary | ICD-10-CM | POA: Diagnosis not present

## 2021-03-01 DIAGNOSIS — I69391 Dysphagia following cerebral infarction: Secondary | ICD-10-CM | POA: Diagnosis not present

## 2021-03-01 DIAGNOSIS — I69828 Other speech and language deficits following other cerebrovascular disease: Secondary | ICD-10-CM | POA: Diagnosis not present

## 2021-03-01 DIAGNOSIS — I10 Essential (primary) hypertension: Secondary | ICD-10-CM | POA: Diagnosis not present

## 2021-03-01 DIAGNOSIS — M6281 Muscle weakness (generalized): Secondary | ICD-10-CM | POA: Diagnosis not present

## 2021-03-01 DIAGNOSIS — I69322 Dysarthria following cerebral infarction: Secondary | ICD-10-CM | POA: Diagnosis not present

## 2021-03-01 DIAGNOSIS — I69398 Other sequelae of cerebral infarction: Secondary | ICD-10-CM | POA: Diagnosis not present

## 2021-03-02 DIAGNOSIS — I69391 Dysphagia following cerebral infarction: Secondary | ICD-10-CM | POA: Diagnosis not present

## 2021-03-02 DIAGNOSIS — I69314 Frontal lobe and executive function deficit following cerebral infarction: Secondary | ICD-10-CM | POA: Diagnosis not present

## 2021-03-02 DIAGNOSIS — I69354 Hemiplegia and hemiparesis following cerebral infarction affecting left non-dominant side: Secondary | ICD-10-CM | POA: Diagnosis not present

## 2021-03-02 DIAGNOSIS — I69322 Dysarthria following cerebral infarction: Secondary | ICD-10-CM | POA: Diagnosis not present

## 2021-03-02 DIAGNOSIS — I10 Essential (primary) hypertension: Secondary | ICD-10-CM | POA: Diagnosis not present

## 2021-03-02 DIAGNOSIS — R011 Cardiac murmur, unspecified: Secondary | ICD-10-CM | POA: Diagnosis not present

## 2021-03-02 DIAGNOSIS — K219 Gastro-esophageal reflux disease without esophagitis: Secondary | ICD-10-CM | POA: Diagnosis not present

## 2021-03-02 DIAGNOSIS — I509 Heart failure, unspecified: Secondary | ICD-10-CM | POA: Diagnosis not present

## 2021-03-02 DIAGNOSIS — I69398 Other sequelae of cerebral infarction: Secondary | ICD-10-CM | POA: Diagnosis not present

## 2021-03-02 DIAGNOSIS — I82409 Acute embolism and thrombosis of unspecified deep veins of unspecified lower extremity: Secondary | ICD-10-CM | POA: Diagnosis not present

## 2021-03-02 DIAGNOSIS — N1831 Chronic kidney disease, stage 3a: Secondary | ICD-10-CM | POA: Diagnosis not present

## 2021-03-02 DIAGNOSIS — M6281 Muscle weakness (generalized): Secondary | ICD-10-CM | POA: Diagnosis not present

## 2021-03-02 DIAGNOSIS — I69828 Other speech and language deficits following other cerebrovascular disease: Secondary | ICD-10-CM | POA: Diagnosis not present

## 2021-03-02 DIAGNOSIS — R1312 Dysphagia, oropharyngeal phase: Secondary | ICD-10-CM | POA: Diagnosis not present

## 2021-03-03 DIAGNOSIS — I69398 Other sequelae of cerebral infarction: Secondary | ICD-10-CM | POA: Diagnosis not present

## 2021-03-03 DIAGNOSIS — R1312 Dysphagia, oropharyngeal phase: Secondary | ICD-10-CM | POA: Diagnosis not present

## 2021-03-03 DIAGNOSIS — M6281 Muscle weakness (generalized): Secondary | ICD-10-CM | POA: Diagnosis not present

## 2021-03-03 DIAGNOSIS — I69354 Hemiplegia and hemiparesis following cerebral infarction affecting left non-dominant side: Secondary | ICD-10-CM | POA: Diagnosis not present

## 2021-03-03 DIAGNOSIS — I69322 Dysarthria following cerebral infarction: Secondary | ICD-10-CM | POA: Diagnosis not present

## 2021-03-03 DIAGNOSIS — I69828 Other speech and language deficits following other cerebrovascular disease: Secondary | ICD-10-CM | POA: Diagnosis not present

## 2021-03-03 DIAGNOSIS — I69391 Dysphagia following cerebral infarction: Secondary | ICD-10-CM | POA: Diagnosis not present

## 2021-03-03 DIAGNOSIS — I509 Heart failure, unspecified: Secondary | ICD-10-CM | POA: Diagnosis not present

## 2021-03-03 DIAGNOSIS — I69314 Frontal lobe and executive function deficit following cerebral infarction: Secondary | ICD-10-CM | POA: Diagnosis not present

## 2021-03-03 DIAGNOSIS — I10 Essential (primary) hypertension: Secondary | ICD-10-CM | POA: Diagnosis not present

## 2021-03-04 DIAGNOSIS — I69398 Other sequelae of cerebral infarction: Secondary | ICD-10-CM | POA: Diagnosis not present

## 2021-03-04 DIAGNOSIS — I10 Essential (primary) hypertension: Secondary | ICD-10-CM | POA: Diagnosis not present

## 2021-03-04 DIAGNOSIS — I509 Heart failure, unspecified: Secondary | ICD-10-CM | POA: Diagnosis not present

## 2021-03-04 DIAGNOSIS — I69391 Dysphagia following cerebral infarction: Secondary | ICD-10-CM | POA: Diagnosis not present

## 2021-03-04 DIAGNOSIS — I69322 Dysarthria following cerebral infarction: Secondary | ICD-10-CM | POA: Diagnosis not present

## 2021-03-04 DIAGNOSIS — I69354 Hemiplegia and hemiparesis following cerebral infarction affecting left non-dominant side: Secondary | ICD-10-CM | POA: Diagnosis not present

## 2021-03-04 DIAGNOSIS — I69828 Other speech and language deficits following other cerebrovascular disease: Secondary | ICD-10-CM | POA: Diagnosis not present

## 2021-03-04 DIAGNOSIS — R1312 Dysphagia, oropharyngeal phase: Secondary | ICD-10-CM | POA: Diagnosis not present

## 2021-03-04 DIAGNOSIS — I69314 Frontal lobe and executive function deficit following cerebral infarction: Secondary | ICD-10-CM | POA: Diagnosis not present

## 2021-03-04 DIAGNOSIS — M6281 Muscle weakness (generalized): Secondary | ICD-10-CM | POA: Diagnosis not present

## 2021-03-05 DIAGNOSIS — I69354 Hemiplegia and hemiparesis following cerebral infarction affecting left non-dominant side: Secondary | ICD-10-CM | POA: Diagnosis not present

## 2021-03-05 DIAGNOSIS — I69391 Dysphagia following cerebral infarction: Secondary | ICD-10-CM | POA: Diagnosis not present

## 2021-03-05 DIAGNOSIS — R1312 Dysphagia, oropharyngeal phase: Secondary | ICD-10-CM | POA: Diagnosis not present

## 2021-03-05 DIAGNOSIS — I10 Essential (primary) hypertension: Secondary | ICD-10-CM | POA: Diagnosis not present

## 2021-03-05 DIAGNOSIS — I69398 Other sequelae of cerebral infarction: Secondary | ICD-10-CM | POA: Diagnosis not present

## 2021-03-05 DIAGNOSIS — I69322 Dysarthria following cerebral infarction: Secondary | ICD-10-CM | POA: Diagnosis not present

## 2021-03-05 DIAGNOSIS — I69314 Frontal lobe and executive function deficit following cerebral infarction: Secondary | ICD-10-CM | POA: Diagnosis not present

## 2021-03-05 DIAGNOSIS — I69828 Other speech and language deficits following other cerebrovascular disease: Secondary | ICD-10-CM | POA: Diagnosis not present

## 2021-03-05 DIAGNOSIS — M6281 Muscle weakness (generalized): Secondary | ICD-10-CM | POA: Diagnosis not present

## 2021-03-05 DIAGNOSIS — I509 Heart failure, unspecified: Secondary | ICD-10-CM | POA: Diagnosis not present

## 2021-03-09 DIAGNOSIS — I69314 Frontal lobe and executive function deficit following cerebral infarction: Secondary | ICD-10-CM | POA: Diagnosis not present

## 2021-03-09 DIAGNOSIS — I69828 Other speech and language deficits following other cerebrovascular disease: Secondary | ICD-10-CM | POA: Diagnosis not present

## 2021-03-09 DIAGNOSIS — R1312 Dysphagia, oropharyngeal phase: Secondary | ICD-10-CM | POA: Diagnosis not present

## 2021-03-09 DIAGNOSIS — I69391 Dysphagia following cerebral infarction: Secondary | ICD-10-CM | POA: Diagnosis not present

## 2021-03-09 DIAGNOSIS — I69398 Other sequelae of cerebral infarction: Secondary | ICD-10-CM | POA: Diagnosis not present

## 2021-03-09 DIAGNOSIS — I10 Essential (primary) hypertension: Secondary | ICD-10-CM | POA: Diagnosis not present

## 2021-03-09 DIAGNOSIS — I69322 Dysarthria following cerebral infarction: Secondary | ICD-10-CM | POA: Diagnosis not present

## 2021-03-09 DIAGNOSIS — I509 Heart failure, unspecified: Secondary | ICD-10-CM | POA: Diagnosis not present

## 2021-03-09 DIAGNOSIS — I69354 Hemiplegia and hemiparesis following cerebral infarction affecting left non-dominant side: Secondary | ICD-10-CM | POA: Diagnosis not present

## 2021-03-09 DIAGNOSIS — M6281 Muscle weakness (generalized): Secondary | ICD-10-CM | POA: Diagnosis not present

## 2021-03-10 DIAGNOSIS — I69314 Frontal lobe and executive function deficit following cerebral infarction: Secondary | ICD-10-CM | POA: Diagnosis not present

## 2021-03-10 DIAGNOSIS — I69391 Dysphagia following cerebral infarction: Secondary | ICD-10-CM | POA: Diagnosis not present

## 2021-03-10 DIAGNOSIS — I69354 Hemiplegia and hemiparesis following cerebral infarction affecting left non-dominant side: Secondary | ICD-10-CM | POA: Diagnosis not present

## 2021-03-10 DIAGNOSIS — M6281 Muscle weakness (generalized): Secondary | ICD-10-CM | POA: Diagnosis not present

## 2021-03-10 DIAGNOSIS — R1312 Dysphagia, oropharyngeal phase: Secondary | ICD-10-CM | POA: Diagnosis not present

## 2021-03-10 DIAGNOSIS — I10 Essential (primary) hypertension: Secondary | ICD-10-CM | POA: Diagnosis not present

## 2021-03-10 DIAGNOSIS — I509 Heart failure, unspecified: Secondary | ICD-10-CM | POA: Diagnosis not present

## 2021-03-10 DIAGNOSIS — I69322 Dysarthria following cerebral infarction: Secondary | ICD-10-CM | POA: Diagnosis not present

## 2021-03-10 DIAGNOSIS — I69828 Other speech and language deficits following other cerebrovascular disease: Secondary | ICD-10-CM | POA: Diagnosis not present

## 2021-03-10 DIAGNOSIS — I69398 Other sequelae of cerebral infarction: Secondary | ICD-10-CM | POA: Diagnosis not present

## 2021-03-11 DIAGNOSIS — I509 Heart failure, unspecified: Secondary | ICD-10-CM | POA: Diagnosis not present

## 2021-03-11 DIAGNOSIS — I69398 Other sequelae of cerebral infarction: Secondary | ICD-10-CM | POA: Diagnosis not present

## 2021-03-11 DIAGNOSIS — I69828 Other speech and language deficits following other cerebrovascular disease: Secondary | ICD-10-CM | POA: Diagnosis not present

## 2021-03-11 DIAGNOSIS — M6281 Muscle weakness (generalized): Secondary | ICD-10-CM | POA: Diagnosis not present

## 2021-03-11 DIAGNOSIS — R1312 Dysphagia, oropharyngeal phase: Secondary | ICD-10-CM | POA: Diagnosis not present

## 2021-03-11 DIAGNOSIS — I69391 Dysphagia following cerebral infarction: Secondary | ICD-10-CM | POA: Diagnosis not present

## 2021-03-11 DIAGNOSIS — I69354 Hemiplegia and hemiparesis following cerebral infarction affecting left non-dominant side: Secondary | ICD-10-CM | POA: Diagnosis not present

## 2021-03-11 DIAGNOSIS — I69314 Frontal lobe and executive function deficit following cerebral infarction: Secondary | ICD-10-CM | POA: Diagnosis not present

## 2021-03-11 DIAGNOSIS — I69322 Dysarthria following cerebral infarction: Secondary | ICD-10-CM | POA: Diagnosis not present

## 2021-03-11 DIAGNOSIS — I10 Essential (primary) hypertension: Secondary | ICD-10-CM | POA: Diagnosis not present

## 2021-03-12 DIAGNOSIS — E559 Vitamin D deficiency, unspecified: Secondary | ICD-10-CM | POA: Diagnosis not present

## 2021-03-12 DIAGNOSIS — E038 Other specified hypothyroidism: Secondary | ICD-10-CM | POA: Diagnosis not present

## 2021-03-12 DIAGNOSIS — D518 Other vitamin B12 deficiency anemias: Secondary | ICD-10-CM | POA: Diagnosis not present

## 2021-03-12 DIAGNOSIS — E119 Type 2 diabetes mellitus without complications: Secondary | ICD-10-CM | POA: Diagnosis not present

## 2021-03-12 DIAGNOSIS — I10 Essential (primary) hypertension: Secondary | ICD-10-CM | POA: Diagnosis not present

## 2021-03-16 DIAGNOSIS — M6281 Muscle weakness (generalized): Secondary | ICD-10-CM | POA: Diagnosis not present

## 2021-03-16 DIAGNOSIS — I69354 Hemiplegia and hemiparesis following cerebral infarction affecting left non-dominant side: Secondary | ICD-10-CM | POA: Diagnosis not present

## 2021-03-16 DIAGNOSIS — I69322 Dysarthria following cerebral infarction: Secondary | ICD-10-CM | POA: Diagnosis not present

## 2021-03-16 DIAGNOSIS — R1312 Dysphagia, oropharyngeal phase: Secondary | ICD-10-CM | POA: Diagnosis not present

## 2021-03-16 DIAGNOSIS — I69391 Dysphagia following cerebral infarction: Secondary | ICD-10-CM | POA: Diagnosis not present

## 2021-03-16 DIAGNOSIS — I69828 Other speech and language deficits following other cerebrovascular disease: Secondary | ICD-10-CM | POA: Diagnosis not present

## 2021-03-16 DIAGNOSIS — I69314 Frontal lobe and executive function deficit following cerebral infarction: Secondary | ICD-10-CM | POA: Diagnosis not present

## 2021-03-16 DIAGNOSIS — I509 Heart failure, unspecified: Secondary | ICD-10-CM | POA: Diagnosis not present

## 2021-03-16 DIAGNOSIS — I10 Essential (primary) hypertension: Secondary | ICD-10-CM | POA: Diagnosis not present

## 2021-03-16 DIAGNOSIS — I69398 Other sequelae of cerebral infarction: Secondary | ICD-10-CM | POA: Diagnosis not present

## 2021-03-17 DIAGNOSIS — R1312 Dysphagia, oropharyngeal phase: Secondary | ICD-10-CM | POA: Diagnosis not present

## 2021-03-17 DIAGNOSIS — I69322 Dysarthria following cerebral infarction: Secondary | ICD-10-CM | POA: Diagnosis not present

## 2021-03-17 DIAGNOSIS — I69391 Dysphagia following cerebral infarction: Secondary | ICD-10-CM | POA: Diagnosis not present

## 2021-03-17 DIAGNOSIS — M6281 Muscle weakness (generalized): Secondary | ICD-10-CM | POA: Diagnosis not present

## 2021-03-17 DIAGNOSIS — I10 Essential (primary) hypertension: Secondary | ICD-10-CM | POA: Diagnosis not present

## 2021-03-17 DIAGNOSIS — I69828 Other speech and language deficits following other cerebrovascular disease: Secondary | ICD-10-CM | POA: Diagnosis not present

## 2021-03-17 DIAGNOSIS — I69354 Hemiplegia and hemiparesis following cerebral infarction affecting left non-dominant side: Secondary | ICD-10-CM | POA: Diagnosis not present

## 2021-03-17 DIAGNOSIS — I69314 Frontal lobe and executive function deficit following cerebral infarction: Secondary | ICD-10-CM | POA: Diagnosis not present

## 2021-03-17 DIAGNOSIS — I69398 Other sequelae of cerebral infarction: Secondary | ICD-10-CM | POA: Diagnosis not present

## 2021-03-17 DIAGNOSIS — I509 Heart failure, unspecified: Secondary | ICD-10-CM | POA: Diagnosis not present

## 2021-03-18 DIAGNOSIS — R1312 Dysphagia, oropharyngeal phase: Secondary | ICD-10-CM | POA: Diagnosis not present

## 2021-03-18 DIAGNOSIS — I69354 Hemiplegia and hemiparesis following cerebral infarction affecting left non-dominant side: Secondary | ICD-10-CM | POA: Diagnosis not present

## 2021-03-18 DIAGNOSIS — I69322 Dysarthria following cerebral infarction: Secondary | ICD-10-CM | POA: Diagnosis not present

## 2021-03-18 DIAGNOSIS — I69391 Dysphagia following cerebral infarction: Secondary | ICD-10-CM | POA: Diagnosis not present

## 2021-03-18 DIAGNOSIS — I69828 Other speech and language deficits following other cerebrovascular disease: Secondary | ICD-10-CM | POA: Diagnosis not present

## 2021-03-18 DIAGNOSIS — I69314 Frontal lobe and executive function deficit following cerebral infarction: Secondary | ICD-10-CM | POA: Diagnosis not present

## 2021-03-18 DIAGNOSIS — I509 Heart failure, unspecified: Secondary | ICD-10-CM | POA: Diagnosis not present

## 2021-03-18 DIAGNOSIS — M6281 Muscle weakness (generalized): Secondary | ICD-10-CM | POA: Diagnosis not present

## 2021-03-18 DIAGNOSIS — I69398 Other sequelae of cerebral infarction: Secondary | ICD-10-CM | POA: Diagnosis not present

## 2021-03-18 DIAGNOSIS — I10 Essential (primary) hypertension: Secondary | ICD-10-CM | POA: Diagnosis not present

## 2021-03-22 DIAGNOSIS — I69354 Hemiplegia and hemiparesis following cerebral infarction affecting left non-dominant side: Secondary | ICD-10-CM | POA: Diagnosis not present

## 2021-03-22 DIAGNOSIS — I69322 Dysarthria following cerebral infarction: Secondary | ICD-10-CM | POA: Diagnosis not present

## 2021-03-22 DIAGNOSIS — I69828 Other speech and language deficits following other cerebrovascular disease: Secondary | ICD-10-CM | POA: Diagnosis not present

## 2021-03-22 DIAGNOSIS — I69314 Frontal lobe and executive function deficit following cerebral infarction: Secondary | ICD-10-CM | POA: Diagnosis not present

## 2021-03-22 DIAGNOSIS — M6281 Muscle weakness (generalized): Secondary | ICD-10-CM | POA: Diagnosis not present

## 2021-03-22 DIAGNOSIS — I509 Heart failure, unspecified: Secondary | ICD-10-CM | POA: Diagnosis not present

## 2021-03-22 DIAGNOSIS — I69391 Dysphagia following cerebral infarction: Secondary | ICD-10-CM | POA: Diagnosis not present

## 2021-03-22 DIAGNOSIS — I69398 Other sequelae of cerebral infarction: Secondary | ICD-10-CM | POA: Diagnosis not present

## 2021-03-22 DIAGNOSIS — I10 Essential (primary) hypertension: Secondary | ICD-10-CM | POA: Diagnosis not present

## 2021-03-22 DIAGNOSIS — R1312 Dysphagia, oropharyngeal phase: Secondary | ICD-10-CM | POA: Diagnosis not present

## 2021-03-24 DIAGNOSIS — R1312 Dysphagia, oropharyngeal phase: Secondary | ICD-10-CM | POA: Diagnosis not present

## 2021-03-24 DIAGNOSIS — I10 Essential (primary) hypertension: Secondary | ICD-10-CM | POA: Diagnosis not present

## 2021-03-24 DIAGNOSIS — I509 Heart failure, unspecified: Secondary | ICD-10-CM | POA: Diagnosis not present

## 2021-03-24 DIAGNOSIS — M6281 Muscle weakness (generalized): Secondary | ICD-10-CM | POA: Diagnosis not present

## 2021-03-24 DIAGNOSIS — I69314 Frontal lobe and executive function deficit following cerebral infarction: Secondary | ICD-10-CM | POA: Diagnosis not present

## 2021-03-24 DIAGNOSIS — I69391 Dysphagia following cerebral infarction: Secondary | ICD-10-CM | POA: Diagnosis not present

## 2021-03-24 DIAGNOSIS — I69354 Hemiplegia and hemiparesis following cerebral infarction affecting left non-dominant side: Secondary | ICD-10-CM | POA: Diagnosis not present

## 2021-03-24 DIAGNOSIS — I69322 Dysarthria following cerebral infarction: Secondary | ICD-10-CM | POA: Diagnosis not present

## 2021-03-24 DIAGNOSIS — I69828 Other speech and language deficits following other cerebrovascular disease: Secondary | ICD-10-CM | POA: Diagnosis not present

## 2021-03-24 DIAGNOSIS — I69398 Other sequelae of cerebral infarction: Secondary | ICD-10-CM | POA: Diagnosis not present

## 2021-03-25 DIAGNOSIS — I69398 Other sequelae of cerebral infarction: Secondary | ICD-10-CM | POA: Diagnosis not present

## 2021-03-25 DIAGNOSIS — I69391 Dysphagia following cerebral infarction: Secondary | ICD-10-CM | POA: Diagnosis not present

## 2021-03-25 DIAGNOSIS — R1312 Dysphagia, oropharyngeal phase: Secondary | ICD-10-CM | POA: Diagnosis not present

## 2021-03-25 DIAGNOSIS — I69322 Dysarthria following cerebral infarction: Secondary | ICD-10-CM | POA: Diagnosis not present

## 2021-03-25 DIAGNOSIS — M6281 Muscle weakness (generalized): Secondary | ICD-10-CM | POA: Diagnosis not present

## 2021-03-25 DIAGNOSIS — I69354 Hemiplegia and hemiparesis following cerebral infarction affecting left non-dominant side: Secondary | ICD-10-CM | POA: Diagnosis not present

## 2021-03-25 DIAGNOSIS — I69828 Other speech and language deficits following other cerebrovascular disease: Secondary | ICD-10-CM | POA: Diagnosis not present

## 2021-03-25 DIAGNOSIS — I69314 Frontal lobe and executive function deficit following cerebral infarction: Secondary | ICD-10-CM | POA: Diagnosis not present

## 2021-03-25 DIAGNOSIS — I509 Heart failure, unspecified: Secondary | ICD-10-CM | POA: Diagnosis not present

## 2021-03-25 DIAGNOSIS — I10 Essential (primary) hypertension: Secondary | ICD-10-CM | POA: Diagnosis not present

## 2021-03-26 DIAGNOSIS — I69828 Other speech and language deficits following other cerebrovascular disease: Secondary | ICD-10-CM | POA: Diagnosis not present

## 2021-03-26 DIAGNOSIS — I69398 Other sequelae of cerebral infarction: Secondary | ICD-10-CM | POA: Diagnosis not present

## 2021-03-26 DIAGNOSIS — M6281 Muscle weakness (generalized): Secondary | ICD-10-CM | POA: Diagnosis not present

## 2021-03-26 DIAGNOSIS — I69322 Dysarthria following cerebral infarction: Secondary | ICD-10-CM | POA: Diagnosis not present

## 2021-03-26 DIAGNOSIS — I69314 Frontal lobe and executive function deficit following cerebral infarction: Secondary | ICD-10-CM | POA: Diagnosis not present

## 2021-03-26 DIAGNOSIS — I10 Essential (primary) hypertension: Secondary | ICD-10-CM | POA: Diagnosis not present

## 2021-03-26 DIAGNOSIS — I509 Heart failure, unspecified: Secondary | ICD-10-CM | POA: Diagnosis not present

## 2021-03-26 DIAGNOSIS — R1312 Dysphagia, oropharyngeal phase: Secondary | ICD-10-CM | POA: Diagnosis not present

## 2021-03-26 DIAGNOSIS — I69391 Dysphagia following cerebral infarction: Secondary | ICD-10-CM | POA: Diagnosis not present

## 2021-03-26 DIAGNOSIS — I69354 Hemiplegia and hemiparesis following cerebral infarction affecting left non-dominant side: Secondary | ICD-10-CM | POA: Diagnosis not present

## 2021-03-29 DIAGNOSIS — I10 Essential (primary) hypertension: Secondary | ICD-10-CM | POA: Diagnosis not present

## 2021-03-29 DIAGNOSIS — I69322 Dysarthria following cerebral infarction: Secondary | ICD-10-CM | POA: Diagnosis not present

## 2021-03-29 DIAGNOSIS — I69314 Frontal lobe and executive function deficit following cerebral infarction: Secondary | ICD-10-CM | POA: Diagnosis not present

## 2021-03-29 DIAGNOSIS — I69398 Other sequelae of cerebral infarction: Secondary | ICD-10-CM | POA: Diagnosis not present

## 2021-03-29 DIAGNOSIS — I69391 Dysphagia following cerebral infarction: Secondary | ICD-10-CM | POA: Diagnosis not present

## 2021-03-29 DIAGNOSIS — I509 Heart failure, unspecified: Secondary | ICD-10-CM | POA: Diagnosis not present

## 2021-03-29 DIAGNOSIS — I69354 Hemiplegia and hemiparesis following cerebral infarction affecting left non-dominant side: Secondary | ICD-10-CM | POA: Diagnosis not present

## 2021-03-29 DIAGNOSIS — R1312 Dysphagia, oropharyngeal phase: Secondary | ICD-10-CM | POA: Diagnosis not present

## 2021-03-29 DIAGNOSIS — I69828 Other speech and language deficits following other cerebrovascular disease: Secondary | ICD-10-CM | POA: Diagnosis not present

## 2021-03-29 DIAGNOSIS — M6281 Muscle weakness (generalized): Secondary | ICD-10-CM | POA: Diagnosis not present

## 2021-03-30 DIAGNOSIS — I69314 Frontal lobe and executive function deficit following cerebral infarction: Secondary | ICD-10-CM | POA: Diagnosis not present

## 2021-03-30 DIAGNOSIS — I69354 Hemiplegia and hemiparesis following cerebral infarction affecting left non-dominant side: Secondary | ICD-10-CM | POA: Diagnosis not present

## 2021-03-30 DIAGNOSIS — I10 Essential (primary) hypertension: Secondary | ICD-10-CM | POA: Diagnosis not present

## 2021-03-30 DIAGNOSIS — I69322 Dysarthria following cerebral infarction: Secondary | ICD-10-CM | POA: Diagnosis not present

## 2021-03-30 DIAGNOSIS — I739 Peripheral vascular disease, unspecified: Secondary | ICD-10-CM | POA: Diagnosis not present

## 2021-03-30 DIAGNOSIS — I69398 Other sequelae of cerebral infarction: Secondary | ICD-10-CM | POA: Diagnosis not present

## 2021-03-30 DIAGNOSIS — R1312 Dysphagia, oropharyngeal phase: Secondary | ICD-10-CM | POA: Diagnosis not present

## 2021-03-30 DIAGNOSIS — M6281 Muscle weakness (generalized): Secondary | ICD-10-CM | POA: Diagnosis not present

## 2021-03-30 DIAGNOSIS — I69828 Other speech and language deficits following other cerebrovascular disease: Secondary | ICD-10-CM | POA: Diagnosis not present

## 2021-03-30 DIAGNOSIS — I509 Heart failure, unspecified: Secondary | ICD-10-CM | POA: Diagnosis not present

## 2021-03-30 DIAGNOSIS — I82409 Acute embolism and thrombosis of unspecified deep veins of unspecified lower extremity: Secondary | ICD-10-CM | POA: Diagnosis not present

## 2021-03-30 DIAGNOSIS — I69391 Dysphagia following cerebral infarction: Secondary | ICD-10-CM | POA: Diagnosis not present

## 2021-03-30 DIAGNOSIS — K219 Gastro-esophageal reflux disease without esophagitis: Secondary | ICD-10-CM | POA: Diagnosis not present

## 2021-03-31 DIAGNOSIS — I509 Heart failure, unspecified: Secondary | ICD-10-CM | POA: Diagnosis not present

## 2021-03-31 DIAGNOSIS — I69354 Hemiplegia and hemiparesis following cerebral infarction affecting left non-dominant side: Secondary | ICD-10-CM | POA: Diagnosis not present

## 2021-03-31 DIAGNOSIS — I69314 Frontal lobe and executive function deficit following cerebral infarction: Secondary | ICD-10-CM | POA: Diagnosis not present

## 2021-03-31 DIAGNOSIS — R1312 Dysphagia, oropharyngeal phase: Secondary | ICD-10-CM | POA: Diagnosis not present

## 2021-03-31 DIAGNOSIS — I10 Essential (primary) hypertension: Secondary | ICD-10-CM | POA: Diagnosis not present

## 2021-03-31 DIAGNOSIS — I69391 Dysphagia following cerebral infarction: Secondary | ICD-10-CM | POA: Diagnosis not present

## 2021-03-31 DIAGNOSIS — I69828 Other speech and language deficits following other cerebrovascular disease: Secondary | ICD-10-CM | POA: Diagnosis not present

## 2021-03-31 DIAGNOSIS — M6281 Muscle weakness (generalized): Secondary | ICD-10-CM | POA: Diagnosis not present

## 2021-03-31 DIAGNOSIS — I69322 Dysarthria following cerebral infarction: Secondary | ICD-10-CM | POA: Diagnosis not present

## 2021-03-31 DIAGNOSIS — I69398 Other sequelae of cerebral infarction: Secondary | ICD-10-CM | POA: Diagnosis not present

## 2021-04-01 DIAGNOSIS — I69828 Other speech and language deficits following other cerebrovascular disease: Secondary | ICD-10-CM | POA: Diagnosis not present

## 2021-04-01 DIAGNOSIS — R1312 Dysphagia, oropharyngeal phase: Secondary | ICD-10-CM | POA: Diagnosis not present

## 2021-04-01 DIAGNOSIS — I69398 Other sequelae of cerebral infarction: Secondary | ICD-10-CM | POA: Diagnosis not present

## 2021-04-01 DIAGNOSIS — I69314 Frontal lobe and executive function deficit following cerebral infarction: Secondary | ICD-10-CM | POA: Diagnosis not present

## 2021-04-01 DIAGNOSIS — I10 Essential (primary) hypertension: Secondary | ICD-10-CM | POA: Diagnosis not present

## 2021-04-01 DIAGNOSIS — M6281 Muscle weakness (generalized): Secondary | ICD-10-CM | POA: Diagnosis not present

## 2021-04-01 DIAGNOSIS — I69391 Dysphagia following cerebral infarction: Secondary | ICD-10-CM | POA: Diagnosis not present

## 2021-04-01 DIAGNOSIS — I69354 Hemiplegia and hemiparesis following cerebral infarction affecting left non-dominant side: Secondary | ICD-10-CM | POA: Diagnosis not present

## 2021-04-01 DIAGNOSIS — I509 Heart failure, unspecified: Secondary | ICD-10-CM | POA: Diagnosis not present

## 2021-04-01 DIAGNOSIS — I69322 Dysarthria following cerebral infarction: Secondary | ICD-10-CM | POA: Diagnosis not present

## 2021-04-02 DIAGNOSIS — I509 Heart failure, unspecified: Secondary | ICD-10-CM | POA: Diagnosis not present

## 2021-04-02 DIAGNOSIS — I69354 Hemiplegia and hemiparesis following cerebral infarction affecting left non-dominant side: Secondary | ICD-10-CM | POA: Diagnosis not present

## 2021-04-02 DIAGNOSIS — I69322 Dysarthria following cerebral infarction: Secondary | ICD-10-CM | POA: Diagnosis not present

## 2021-04-02 DIAGNOSIS — I69398 Other sequelae of cerebral infarction: Secondary | ICD-10-CM | POA: Diagnosis not present

## 2021-04-02 DIAGNOSIS — I69314 Frontal lobe and executive function deficit following cerebral infarction: Secondary | ICD-10-CM | POA: Diagnosis not present

## 2021-04-02 DIAGNOSIS — I69391 Dysphagia following cerebral infarction: Secondary | ICD-10-CM | POA: Diagnosis not present

## 2021-04-02 DIAGNOSIS — M6281 Muscle weakness (generalized): Secondary | ICD-10-CM | POA: Diagnosis not present

## 2021-04-02 DIAGNOSIS — R1312 Dysphagia, oropharyngeal phase: Secondary | ICD-10-CM | POA: Diagnosis not present

## 2021-04-02 DIAGNOSIS — I69828 Other speech and language deficits following other cerebrovascular disease: Secondary | ICD-10-CM | POA: Diagnosis not present

## 2021-04-02 DIAGNOSIS — I10 Essential (primary) hypertension: Secondary | ICD-10-CM | POA: Diagnosis not present

## 2021-04-05 DIAGNOSIS — M6281 Muscle weakness (generalized): Secondary | ICD-10-CM | POA: Diagnosis not present

## 2021-04-05 DIAGNOSIS — I509 Heart failure, unspecified: Secondary | ICD-10-CM | POA: Diagnosis not present

## 2021-04-05 DIAGNOSIS — R1312 Dysphagia, oropharyngeal phase: Secondary | ICD-10-CM | POA: Diagnosis not present

## 2021-04-05 DIAGNOSIS — I69828 Other speech and language deficits following other cerebrovascular disease: Secondary | ICD-10-CM | POA: Diagnosis not present

## 2021-04-05 DIAGNOSIS — I69391 Dysphagia following cerebral infarction: Secondary | ICD-10-CM | POA: Diagnosis not present

## 2021-04-05 DIAGNOSIS — I69314 Frontal lobe and executive function deficit following cerebral infarction: Secondary | ICD-10-CM | POA: Diagnosis not present

## 2021-04-05 DIAGNOSIS — I69398 Other sequelae of cerebral infarction: Secondary | ICD-10-CM | POA: Diagnosis not present

## 2021-04-05 DIAGNOSIS — I10 Essential (primary) hypertension: Secondary | ICD-10-CM | POA: Diagnosis not present

## 2021-04-05 DIAGNOSIS — I69322 Dysarthria following cerebral infarction: Secondary | ICD-10-CM | POA: Diagnosis not present

## 2021-04-05 DIAGNOSIS — I69354 Hemiplegia and hemiparesis following cerebral infarction affecting left non-dominant side: Secondary | ICD-10-CM | POA: Diagnosis not present

## 2021-04-06 DIAGNOSIS — M6281 Muscle weakness (generalized): Secondary | ICD-10-CM | POA: Diagnosis not present

## 2021-04-06 DIAGNOSIS — R1312 Dysphagia, oropharyngeal phase: Secondary | ICD-10-CM | POA: Diagnosis not present

## 2021-04-06 DIAGNOSIS — I69391 Dysphagia following cerebral infarction: Secondary | ICD-10-CM | POA: Diagnosis not present

## 2021-04-06 DIAGNOSIS — I69828 Other speech and language deficits following other cerebrovascular disease: Secondary | ICD-10-CM | POA: Diagnosis not present

## 2021-04-06 DIAGNOSIS — I69314 Frontal lobe and executive function deficit following cerebral infarction: Secondary | ICD-10-CM | POA: Diagnosis not present

## 2021-04-06 DIAGNOSIS — I509 Heart failure, unspecified: Secondary | ICD-10-CM | POA: Diagnosis not present

## 2021-04-06 DIAGNOSIS — I69398 Other sequelae of cerebral infarction: Secondary | ICD-10-CM | POA: Diagnosis not present

## 2021-04-06 DIAGNOSIS — I10 Essential (primary) hypertension: Secondary | ICD-10-CM | POA: Diagnosis not present

## 2021-04-06 DIAGNOSIS — I69322 Dysarthria following cerebral infarction: Secondary | ICD-10-CM | POA: Diagnosis not present

## 2021-04-06 DIAGNOSIS — I69354 Hemiplegia and hemiparesis following cerebral infarction affecting left non-dominant side: Secondary | ICD-10-CM | POA: Diagnosis not present

## 2021-04-07 DIAGNOSIS — I69322 Dysarthria following cerebral infarction: Secondary | ICD-10-CM | POA: Diagnosis not present

## 2021-04-07 DIAGNOSIS — I69314 Frontal lobe and executive function deficit following cerebral infarction: Secondary | ICD-10-CM | POA: Diagnosis not present

## 2021-04-07 DIAGNOSIS — I69391 Dysphagia following cerebral infarction: Secondary | ICD-10-CM | POA: Diagnosis not present

## 2021-04-07 DIAGNOSIS — I509 Heart failure, unspecified: Secondary | ICD-10-CM | POA: Diagnosis not present

## 2021-04-07 DIAGNOSIS — R1312 Dysphagia, oropharyngeal phase: Secondary | ICD-10-CM | POA: Diagnosis not present

## 2021-04-07 DIAGNOSIS — M6281 Muscle weakness (generalized): Secondary | ICD-10-CM | POA: Diagnosis not present

## 2021-04-07 DIAGNOSIS — I69398 Other sequelae of cerebral infarction: Secondary | ICD-10-CM | POA: Diagnosis not present

## 2021-04-07 DIAGNOSIS — I69354 Hemiplegia and hemiparesis following cerebral infarction affecting left non-dominant side: Secondary | ICD-10-CM | POA: Diagnosis not present

## 2021-04-07 DIAGNOSIS — I69828 Other speech and language deficits following other cerebrovascular disease: Secondary | ICD-10-CM | POA: Diagnosis not present

## 2021-04-07 DIAGNOSIS — I10 Essential (primary) hypertension: Secondary | ICD-10-CM | POA: Diagnosis not present

## 2021-04-08 DIAGNOSIS — I69354 Hemiplegia and hemiparesis following cerebral infarction affecting left non-dominant side: Secondary | ICD-10-CM | POA: Diagnosis not present

## 2021-04-08 DIAGNOSIS — M6281 Muscle weakness (generalized): Secondary | ICD-10-CM | POA: Diagnosis not present

## 2021-04-08 DIAGNOSIS — I69391 Dysphagia following cerebral infarction: Secondary | ICD-10-CM | POA: Diagnosis not present

## 2021-04-08 DIAGNOSIS — I69322 Dysarthria following cerebral infarction: Secondary | ICD-10-CM | POA: Diagnosis not present

## 2021-04-08 DIAGNOSIS — R1312 Dysphagia, oropharyngeal phase: Secondary | ICD-10-CM | POA: Diagnosis not present

## 2021-04-08 DIAGNOSIS — I69828 Other speech and language deficits following other cerebrovascular disease: Secondary | ICD-10-CM | POA: Diagnosis not present

## 2021-04-08 DIAGNOSIS — I10 Essential (primary) hypertension: Secondary | ICD-10-CM | POA: Diagnosis not present

## 2021-04-08 DIAGNOSIS — I509 Heart failure, unspecified: Secondary | ICD-10-CM | POA: Diagnosis not present

## 2021-04-08 DIAGNOSIS — I69314 Frontal lobe and executive function deficit following cerebral infarction: Secondary | ICD-10-CM | POA: Diagnosis not present

## 2021-04-08 DIAGNOSIS — I69398 Other sequelae of cerebral infarction: Secondary | ICD-10-CM | POA: Diagnosis not present

## 2021-04-09 DIAGNOSIS — R1312 Dysphagia, oropharyngeal phase: Secondary | ICD-10-CM | POA: Diagnosis not present

## 2021-04-09 DIAGNOSIS — E559 Vitamin D deficiency, unspecified: Secondary | ICD-10-CM | POA: Diagnosis not present

## 2021-04-09 DIAGNOSIS — I509 Heart failure, unspecified: Secondary | ICD-10-CM | POA: Diagnosis not present

## 2021-04-09 DIAGNOSIS — M6281 Muscle weakness (generalized): Secondary | ICD-10-CM | POA: Diagnosis not present

## 2021-04-09 DIAGNOSIS — I69398 Other sequelae of cerebral infarction: Secondary | ICD-10-CM | POA: Diagnosis not present

## 2021-04-09 DIAGNOSIS — I69391 Dysphagia following cerebral infarction: Secondary | ICD-10-CM | POA: Diagnosis not present

## 2021-04-09 DIAGNOSIS — I69354 Hemiplegia and hemiparesis following cerebral infarction affecting left non-dominant side: Secondary | ICD-10-CM | POA: Diagnosis not present

## 2021-04-09 DIAGNOSIS — I69828 Other speech and language deficits following other cerebrovascular disease: Secondary | ICD-10-CM | POA: Diagnosis not present

## 2021-04-09 DIAGNOSIS — I69322 Dysarthria following cerebral infarction: Secondary | ICD-10-CM | POA: Diagnosis not present

## 2021-04-09 DIAGNOSIS — I10 Essential (primary) hypertension: Secondary | ICD-10-CM | POA: Diagnosis not present

## 2021-04-09 DIAGNOSIS — E119 Type 2 diabetes mellitus without complications: Secondary | ICD-10-CM | POA: Diagnosis not present

## 2021-04-09 DIAGNOSIS — D518 Other vitamin B12 deficiency anemias: Secondary | ICD-10-CM | POA: Diagnosis not present

## 2021-04-09 DIAGNOSIS — I69314 Frontal lobe and executive function deficit following cerebral infarction: Secondary | ICD-10-CM | POA: Diagnosis not present

## 2021-04-09 DIAGNOSIS — E038 Other specified hypothyroidism: Secondary | ICD-10-CM | POA: Diagnosis not present

## 2021-04-12 DIAGNOSIS — I509 Heart failure, unspecified: Secondary | ICD-10-CM | POA: Diagnosis not present

## 2021-04-12 DIAGNOSIS — I69354 Hemiplegia and hemiparesis following cerebral infarction affecting left non-dominant side: Secondary | ICD-10-CM | POA: Diagnosis not present

## 2021-04-12 DIAGNOSIS — I69398 Other sequelae of cerebral infarction: Secondary | ICD-10-CM | POA: Diagnosis not present

## 2021-04-12 DIAGNOSIS — R1312 Dysphagia, oropharyngeal phase: Secondary | ICD-10-CM | POA: Diagnosis not present

## 2021-04-12 DIAGNOSIS — I10 Essential (primary) hypertension: Secondary | ICD-10-CM | POA: Diagnosis not present

## 2021-04-12 DIAGNOSIS — I69322 Dysarthria following cerebral infarction: Secondary | ICD-10-CM | POA: Diagnosis not present

## 2021-04-12 DIAGNOSIS — I69828 Other speech and language deficits following other cerebrovascular disease: Secondary | ICD-10-CM | POA: Diagnosis not present

## 2021-04-12 DIAGNOSIS — I69391 Dysphagia following cerebral infarction: Secondary | ICD-10-CM | POA: Diagnosis not present

## 2021-04-12 DIAGNOSIS — M6281 Muscle weakness (generalized): Secondary | ICD-10-CM | POA: Diagnosis not present

## 2021-04-12 DIAGNOSIS — I69314 Frontal lobe and executive function deficit following cerebral infarction: Secondary | ICD-10-CM | POA: Diagnosis not present

## 2021-04-13 DIAGNOSIS — I69322 Dysarthria following cerebral infarction: Secondary | ICD-10-CM | POA: Diagnosis not present

## 2021-04-13 DIAGNOSIS — I69398 Other sequelae of cerebral infarction: Secondary | ICD-10-CM | POA: Diagnosis not present

## 2021-04-13 DIAGNOSIS — I509 Heart failure, unspecified: Secondary | ICD-10-CM | POA: Diagnosis not present

## 2021-04-13 DIAGNOSIS — R1312 Dysphagia, oropharyngeal phase: Secondary | ICD-10-CM | POA: Diagnosis not present

## 2021-04-13 DIAGNOSIS — I69391 Dysphagia following cerebral infarction: Secondary | ICD-10-CM | POA: Diagnosis not present

## 2021-04-13 DIAGNOSIS — I10 Essential (primary) hypertension: Secondary | ICD-10-CM | POA: Diagnosis not present

## 2021-04-13 DIAGNOSIS — I69314 Frontal lobe and executive function deficit following cerebral infarction: Secondary | ICD-10-CM | POA: Diagnosis not present

## 2021-04-13 DIAGNOSIS — I69354 Hemiplegia and hemiparesis following cerebral infarction affecting left non-dominant side: Secondary | ICD-10-CM | POA: Diagnosis not present

## 2021-04-13 DIAGNOSIS — I69828 Other speech and language deficits following other cerebrovascular disease: Secondary | ICD-10-CM | POA: Diagnosis not present

## 2021-04-13 DIAGNOSIS — M6281 Muscle weakness (generalized): Secondary | ICD-10-CM | POA: Diagnosis not present

## 2021-04-14 DIAGNOSIS — J449 Chronic obstructive pulmonary disease, unspecified: Secondary | ICD-10-CM | POA: Diagnosis not present

## 2021-04-14 DIAGNOSIS — I69322 Dysarthria following cerebral infarction: Secondary | ICD-10-CM | POA: Diagnosis not present

## 2021-04-14 DIAGNOSIS — R1312 Dysphagia, oropharyngeal phase: Secondary | ICD-10-CM | POA: Diagnosis not present

## 2021-04-14 DIAGNOSIS — I69828 Other speech and language deficits following other cerebrovascular disease: Secondary | ICD-10-CM | POA: Diagnosis not present

## 2021-04-14 DIAGNOSIS — I69391 Dysphagia following cerebral infarction: Secondary | ICD-10-CM | POA: Diagnosis not present

## 2021-04-14 DIAGNOSIS — I10 Essential (primary) hypertension: Secondary | ICD-10-CM | POA: Diagnosis not present

## 2021-04-14 DIAGNOSIS — I69314 Frontal lobe and executive function deficit following cerebral infarction: Secondary | ICD-10-CM | POA: Diagnosis not present

## 2021-04-14 DIAGNOSIS — M6281 Muscle weakness (generalized): Secondary | ICD-10-CM | POA: Diagnosis not present

## 2021-04-14 DIAGNOSIS — I509 Heart failure, unspecified: Secondary | ICD-10-CM | POA: Diagnosis not present

## 2021-04-14 DIAGNOSIS — I69354 Hemiplegia and hemiparesis following cerebral infarction affecting left non-dominant side: Secondary | ICD-10-CM | POA: Diagnosis not present

## 2021-04-14 DIAGNOSIS — I69398 Other sequelae of cerebral infarction: Secondary | ICD-10-CM | POA: Diagnosis not present

## 2021-04-15 DIAGNOSIS — I69354 Hemiplegia and hemiparesis following cerebral infarction affecting left non-dominant side: Secondary | ICD-10-CM | POA: Diagnosis not present

## 2021-04-15 DIAGNOSIS — I69398 Other sequelae of cerebral infarction: Secondary | ICD-10-CM | POA: Diagnosis not present

## 2021-04-15 DIAGNOSIS — I509 Heart failure, unspecified: Secondary | ICD-10-CM | POA: Diagnosis not present

## 2021-04-15 DIAGNOSIS — M6281 Muscle weakness (generalized): Secondary | ICD-10-CM | POA: Diagnosis not present

## 2021-04-15 DIAGNOSIS — I69322 Dysarthria following cerebral infarction: Secondary | ICD-10-CM | POA: Diagnosis not present

## 2021-04-15 DIAGNOSIS — I69828 Other speech and language deficits following other cerebrovascular disease: Secondary | ICD-10-CM | POA: Diagnosis not present

## 2021-04-15 DIAGNOSIS — I69391 Dysphagia following cerebral infarction: Secondary | ICD-10-CM | POA: Diagnosis not present

## 2021-04-15 DIAGNOSIS — R1312 Dysphagia, oropharyngeal phase: Secondary | ICD-10-CM | POA: Diagnosis not present

## 2021-04-15 DIAGNOSIS — I69314 Frontal lobe and executive function deficit following cerebral infarction: Secondary | ICD-10-CM | POA: Diagnosis not present

## 2021-04-15 DIAGNOSIS — I10 Essential (primary) hypertension: Secondary | ICD-10-CM | POA: Diagnosis not present

## 2021-04-16 DIAGNOSIS — I69322 Dysarthria following cerebral infarction: Secondary | ICD-10-CM | POA: Diagnosis not present

## 2021-04-16 DIAGNOSIS — R1312 Dysphagia, oropharyngeal phase: Secondary | ICD-10-CM | POA: Diagnosis not present

## 2021-04-16 DIAGNOSIS — M6281 Muscle weakness (generalized): Secondary | ICD-10-CM | POA: Diagnosis not present

## 2021-04-16 DIAGNOSIS — I69828 Other speech and language deficits following other cerebrovascular disease: Secondary | ICD-10-CM | POA: Diagnosis not present

## 2021-04-16 DIAGNOSIS — I10 Essential (primary) hypertension: Secondary | ICD-10-CM | POA: Diagnosis not present

## 2021-04-16 DIAGNOSIS — I69391 Dysphagia following cerebral infarction: Secondary | ICD-10-CM | POA: Diagnosis not present

## 2021-04-16 DIAGNOSIS — I509 Heart failure, unspecified: Secondary | ICD-10-CM | POA: Diagnosis not present

## 2021-04-16 DIAGNOSIS — I69354 Hemiplegia and hemiparesis following cerebral infarction affecting left non-dominant side: Secondary | ICD-10-CM | POA: Diagnosis not present

## 2021-04-16 DIAGNOSIS — I69314 Frontal lobe and executive function deficit following cerebral infarction: Secondary | ICD-10-CM | POA: Diagnosis not present

## 2021-04-16 DIAGNOSIS — I69398 Other sequelae of cerebral infarction: Secondary | ICD-10-CM | POA: Diagnosis not present

## 2021-04-19 DIAGNOSIS — R1312 Dysphagia, oropharyngeal phase: Secondary | ICD-10-CM | POA: Diagnosis not present

## 2021-04-19 DIAGNOSIS — I69354 Hemiplegia and hemiparesis following cerebral infarction affecting left non-dominant side: Secondary | ICD-10-CM | POA: Diagnosis not present

## 2021-04-19 DIAGNOSIS — I69314 Frontal lobe and executive function deficit following cerebral infarction: Secondary | ICD-10-CM | POA: Diagnosis not present

## 2021-04-19 DIAGNOSIS — M6281 Muscle weakness (generalized): Secondary | ICD-10-CM | POA: Diagnosis not present

## 2021-04-19 DIAGNOSIS — I10 Essential (primary) hypertension: Secondary | ICD-10-CM | POA: Diagnosis not present

## 2021-04-19 DIAGNOSIS — I69322 Dysarthria following cerebral infarction: Secondary | ICD-10-CM | POA: Diagnosis not present

## 2021-04-19 DIAGNOSIS — I69391 Dysphagia following cerebral infarction: Secondary | ICD-10-CM | POA: Diagnosis not present

## 2021-04-19 DIAGNOSIS — I69828 Other speech and language deficits following other cerebrovascular disease: Secondary | ICD-10-CM | POA: Diagnosis not present

## 2021-04-19 DIAGNOSIS — I69398 Other sequelae of cerebral infarction: Secondary | ICD-10-CM | POA: Diagnosis not present

## 2021-04-19 DIAGNOSIS — I509 Heart failure, unspecified: Secondary | ICD-10-CM | POA: Diagnosis not present

## 2021-04-21 DIAGNOSIS — I69391 Dysphagia following cerebral infarction: Secondary | ICD-10-CM | POA: Diagnosis not present

## 2021-04-21 DIAGNOSIS — I509 Heart failure, unspecified: Secondary | ICD-10-CM | POA: Diagnosis not present

## 2021-04-21 DIAGNOSIS — I69314 Frontal lobe and executive function deficit following cerebral infarction: Secondary | ICD-10-CM | POA: Diagnosis not present

## 2021-04-21 DIAGNOSIS — I10 Essential (primary) hypertension: Secondary | ICD-10-CM | POA: Diagnosis not present

## 2021-04-21 DIAGNOSIS — M6281 Muscle weakness (generalized): Secondary | ICD-10-CM | POA: Diagnosis not present

## 2021-04-21 DIAGNOSIS — I69398 Other sequelae of cerebral infarction: Secondary | ICD-10-CM | POA: Diagnosis not present

## 2021-04-21 DIAGNOSIS — I69354 Hemiplegia and hemiparesis following cerebral infarction affecting left non-dominant side: Secondary | ICD-10-CM | POA: Diagnosis not present

## 2021-04-21 DIAGNOSIS — I69322 Dysarthria following cerebral infarction: Secondary | ICD-10-CM | POA: Diagnosis not present

## 2021-04-21 DIAGNOSIS — I69828 Other speech and language deficits following other cerebrovascular disease: Secondary | ICD-10-CM | POA: Diagnosis not present

## 2021-04-21 DIAGNOSIS — R1312 Dysphagia, oropharyngeal phase: Secondary | ICD-10-CM | POA: Diagnosis not present

## 2021-04-22 DIAGNOSIS — I10 Essential (primary) hypertension: Secondary | ICD-10-CM | POA: Diagnosis not present

## 2021-04-22 DIAGNOSIS — R1312 Dysphagia, oropharyngeal phase: Secondary | ICD-10-CM | POA: Diagnosis not present

## 2021-04-22 DIAGNOSIS — I69322 Dysarthria following cerebral infarction: Secondary | ICD-10-CM | POA: Diagnosis not present

## 2021-04-22 DIAGNOSIS — I69828 Other speech and language deficits following other cerebrovascular disease: Secondary | ICD-10-CM | POA: Diagnosis not present

## 2021-04-22 DIAGNOSIS — M6281 Muscle weakness (generalized): Secondary | ICD-10-CM | POA: Diagnosis not present

## 2021-04-22 DIAGNOSIS — I69314 Frontal lobe and executive function deficit following cerebral infarction: Secondary | ICD-10-CM | POA: Diagnosis not present

## 2021-04-22 DIAGNOSIS — I69398 Other sequelae of cerebral infarction: Secondary | ICD-10-CM | POA: Diagnosis not present

## 2021-04-22 DIAGNOSIS — I509 Heart failure, unspecified: Secondary | ICD-10-CM | POA: Diagnosis not present

## 2021-04-22 DIAGNOSIS — I69391 Dysphagia following cerebral infarction: Secondary | ICD-10-CM | POA: Diagnosis not present

## 2021-04-22 DIAGNOSIS — I69354 Hemiplegia and hemiparesis following cerebral infarction affecting left non-dominant side: Secondary | ICD-10-CM | POA: Diagnosis not present

## 2021-04-23 DIAGNOSIS — I69398 Other sequelae of cerebral infarction: Secondary | ICD-10-CM | POA: Diagnosis not present

## 2021-04-23 DIAGNOSIS — I69828 Other speech and language deficits following other cerebrovascular disease: Secondary | ICD-10-CM | POA: Diagnosis not present

## 2021-04-23 DIAGNOSIS — I69322 Dysarthria following cerebral infarction: Secondary | ICD-10-CM | POA: Diagnosis not present

## 2021-04-23 DIAGNOSIS — R1312 Dysphagia, oropharyngeal phase: Secondary | ICD-10-CM | POA: Diagnosis not present

## 2021-04-23 DIAGNOSIS — I69391 Dysphagia following cerebral infarction: Secondary | ICD-10-CM | POA: Diagnosis not present

## 2021-04-23 DIAGNOSIS — I69314 Frontal lobe and executive function deficit following cerebral infarction: Secondary | ICD-10-CM | POA: Diagnosis not present

## 2021-04-23 DIAGNOSIS — I509 Heart failure, unspecified: Secondary | ICD-10-CM | POA: Diagnosis not present

## 2021-04-23 DIAGNOSIS — M6281 Muscle weakness (generalized): Secondary | ICD-10-CM | POA: Diagnosis not present

## 2021-04-23 DIAGNOSIS — I10 Essential (primary) hypertension: Secondary | ICD-10-CM | POA: Diagnosis not present

## 2021-04-23 DIAGNOSIS — I69354 Hemiplegia and hemiparesis following cerebral infarction affecting left non-dominant side: Secondary | ICD-10-CM | POA: Diagnosis not present

## 2021-04-26 DIAGNOSIS — I69828 Other speech and language deficits following other cerebrovascular disease: Secondary | ICD-10-CM | POA: Diagnosis not present

## 2021-04-26 DIAGNOSIS — I10 Essential (primary) hypertension: Secondary | ICD-10-CM | POA: Diagnosis not present

## 2021-04-26 DIAGNOSIS — I69322 Dysarthria following cerebral infarction: Secondary | ICD-10-CM | POA: Diagnosis not present

## 2021-04-26 DIAGNOSIS — E119 Type 2 diabetes mellitus without complications: Secondary | ICD-10-CM | POA: Diagnosis not present

## 2021-04-26 DIAGNOSIS — I69354 Hemiplegia and hemiparesis following cerebral infarction affecting left non-dominant side: Secondary | ICD-10-CM | POA: Diagnosis not present

## 2021-04-26 DIAGNOSIS — E559 Vitamin D deficiency, unspecified: Secondary | ICD-10-CM | POA: Diagnosis not present

## 2021-04-26 DIAGNOSIS — R1312 Dysphagia, oropharyngeal phase: Secondary | ICD-10-CM | POA: Diagnosis not present

## 2021-04-26 DIAGNOSIS — I69314 Frontal lobe and executive function deficit following cerebral infarction: Secondary | ICD-10-CM | POA: Diagnosis not present

## 2021-04-26 DIAGNOSIS — M6281 Muscle weakness (generalized): Secondary | ICD-10-CM | POA: Diagnosis not present

## 2021-04-26 DIAGNOSIS — I69391 Dysphagia following cerebral infarction: Secondary | ICD-10-CM | POA: Diagnosis not present

## 2021-04-26 DIAGNOSIS — E038 Other specified hypothyroidism: Secondary | ICD-10-CM | POA: Diagnosis not present

## 2021-04-26 DIAGNOSIS — D518 Other vitamin B12 deficiency anemias: Secondary | ICD-10-CM | POA: Diagnosis not present

## 2021-04-26 DIAGNOSIS — I69398 Other sequelae of cerebral infarction: Secondary | ICD-10-CM | POA: Diagnosis not present

## 2021-04-26 DIAGNOSIS — I509 Heart failure, unspecified: Secondary | ICD-10-CM | POA: Diagnosis not present

## 2021-04-27 DIAGNOSIS — I739 Peripheral vascular disease, unspecified: Secondary | ICD-10-CM | POA: Diagnosis not present

## 2021-04-27 DIAGNOSIS — I82409 Acute embolism and thrombosis of unspecified deep veins of unspecified lower extremity: Secondary | ICD-10-CM | POA: Diagnosis not present

## 2021-04-27 DIAGNOSIS — K219 Gastro-esophageal reflux disease without esophagitis: Secondary | ICD-10-CM | POA: Diagnosis not present

## 2021-04-27 DIAGNOSIS — I10 Essential (primary) hypertension: Secondary | ICD-10-CM | POA: Diagnosis not present

## 2021-04-28 DIAGNOSIS — I69354 Hemiplegia and hemiparesis following cerebral infarction affecting left non-dominant side: Secondary | ICD-10-CM | POA: Diagnosis not present

## 2021-04-28 DIAGNOSIS — I69828 Other speech and language deficits following other cerebrovascular disease: Secondary | ICD-10-CM | POA: Diagnosis not present

## 2021-04-28 DIAGNOSIS — I509 Heart failure, unspecified: Secondary | ICD-10-CM | POA: Diagnosis not present

## 2021-04-28 DIAGNOSIS — I69314 Frontal lobe and executive function deficit following cerebral infarction: Secondary | ICD-10-CM | POA: Diagnosis not present

## 2021-04-28 DIAGNOSIS — I69391 Dysphagia following cerebral infarction: Secondary | ICD-10-CM | POA: Diagnosis not present

## 2021-04-28 DIAGNOSIS — I69398 Other sequelae of cerebral infarction: Secondary | ICD-10-CM | POA: Diagnosis not present

## 2021-04-28 DIAGNOSIS — R1312 Dysphagia, oropharyngeal phase: Secondary | ICD-10-CM | POA: Diagnosis not present

## 2021-04-28 DIAGNOSIS — M6281 Muscle weakness (generalized): Secondary | ICD-10-CM | POA: Diagnosis not present

## 2021-04-28 DIAGNOSIS — I69322 Dysarthria following cerebral infarction: Secondary | ICD-10-CM | POA: Diagnosis not present

## 2021-04-28 DIAGNOSIS — I10 Essential (primary) hypertension: Secondary | ICD-10-CM | POA: Diagnosis not present

## 2021-04-29 DIAGNOSIS — M6281 Muscle weakness (generalized): Secondary | ICD-10-CM | POA: Diagnosis not present

## 2021-04-29 DIAGNOSIS — I69398 Other sequelae of cerebral infarction: Secondary | ICD-10-CM | POA: Diagnosis not present

## 2021-04-29 DIAGNOSIS — I69314 Frontal lobe and executive function deficit following cerebral infarction: Secondary | ICD-10-CM | POA: Diagnosis not present

## 2021-04-29 DIAGNOSIS — I509 Heart failure, unspecified: Secondary | ICD-10-CM | POA: Diagnosis not present

## 2021-04-29 DIAGNOSIS — I69354 Hemiplegia and hemiparesis following cerebral infarction affecting left non-dominant side: Secondary | ICD-10-CM | POA: Diagnosis not present

## 2021-04-29 DIAGNOSIS — I69391 Dysphagia following cerebral infarction: Secondary | ICD-10-CM | POA: Diagnosis not present

## 2021-04-29 DIAGNOSIS — I69322 Dysarthria following cerebral infarction: Secondary | ICD-10-CM | POA: Diagnosis not present

## 2021-04-29 DIAGNOSIS — I10 Essential (primary) hypertension: Secondary | ICD-10-CM | POA: Diagnosis not present

## 2021-04-29 DIAGNOSIS — I69828 Other speech and language deficits following other cerebrovascular disease: Secondary | ICD-10-CM | POA: Diagnosis not present

## 2021-04-29 DIAGNOSIS — R1312 Dysphagia, oropharyngeal phase: Secondary | ICD-10-CM | POA: Diagnosis not present

## 2021-04-30 DIAGNOSIS — I69322 Dysarthria following cerebral infarction: Secondary | ICD-10-CM | POA: Diagnosis not present

## 2021-04-30 DIAGNOSIS — M6281 Muscle weakness (generalized): Secondary | ICD-10-CM | POA: Diagnosis not present

## 2021-04-30 DIAGNOSIS — I69314 Frontal lobe and executive function deficit following cerebral infarction: Secondary | ICD-10-CM | POA: Diagnosis not present

## 2021-04-30 DIAGNOSIS — I10 Essential (primary) hypertension: Secondary | ICD-10-CM | POA: Diagnosis not present

## 2021-04-30 DIAGNOSIS — I69354 Hemiplegia and hemiparesis following cerebral infarction affecting left non-dominant side: Secondary | ICD-10-CM | POA: Diagnosis not present

## 2021-04-30 DIAGNOSIS — I69391 Dysphagia following cerebral infarction: Secondary | ICD-10-CM | POA: Diagnosis not present

## 2021-04-30 DIAGNOSIS — I509 Heart failure, unspecified: Secondary | ICD-10-CM | POA: Diagnosis not present

## 2021-04-30 DIAGNOSIS — I69828 Other speech and language deficits following other cerebrovascular disease: Secondary | ICD-10-CM | POA: Diagnosis not present

## 2021-04-30 DIAGNOSIS — I69398 Other sequelae of cerebral infarction: Secondary | ICD-10-CM | POA: Diagnosis not present

## 2021-04-30 DIAGNOSIS — R1312 Dysphagia, oropharyngeal phase: Secondary | ICD-10-CM | POA: Diagnosis not present

## 2021-05-03 DIAGNOSIS — I69391 Dysphagia following cerebral infarction: Secondary | ICD-10-CM | POA: Diagnosis not present

## 2021-05-03 DIAGNOSIS — I69828 Other speech and language deficits following other cerebrovascular disease: Secondary | ICD-10-CM | POA: Diagnosis not present

## 2021-05-03 DIAGNOSIS — I69322 Dysarthria following cerebral infarction: Secondary | ICD-10-CM | POA: Diagnosis not present

## 2021-05-03 DIAGNOSIS — I10 Essential (primary) hypertension: Secondary | ICD-10-CM | POA: Diagnosis not present

## 2021-05-03 DIAGNOSIS — I509 Heart failure, unspecified: Secondary | ICD-10-CM | POA: Diagnosis not present

## 2021-05-03 DIAGNOSIS — M6281 Muscle weakness (generalized): Secondary | ICD-10-CM | POA: Diagnosis not present

## 2021-05-03 DIAGNOSIS — I69314 Frontal lobe and executive function deficit following cerebral infarction: Secondary | ICD-10-CM | POA: Diagnosis not present

## 2021-05-03 DIAGNOSIS — I69398 Other sequelae of cerebral infarction: Secondary | ICD-10-CM | POA: Diagnosis not present

## 2021-05-03 DIAGNOSIS — I69354 Hemiplegia and hemiparesis following cerebral infarction affecting left non-dominant side: Secondary | ICD-10-CM | POA: Diagnosis not present

## 2021-05-03 DIAGNOSIS — R1312 Dysphagia, oropharyngeal phase: Secondary | ICD-10-CM | POA: Diagnosis not present

## 2021-05-05 DIAGNOSIS — I509 Heart failure, unspecified: Secondary | ICD-10-CM | POA: Diagnosis not present

## 2021-05-05 DIAGNOSIS — I69322 Dysarthria following cerebral infarction: Secondary | ICD-10-CM | POA: Diagnosis not present

## 2021-05-05 DIAGNOSIS — I69354 Hemiplegia and hemiparesis following cerebral infarction affecting left non-dominant side: Secondary | ICD-10-CM | POA: Diagnosis not present

## 2021-05-05 DIAGNOSIS — I69398 Other sequelae of cerebral infarction: Secondary | ICD-10-CM | POA: Diagnosis not present

## 2021-05-05 DIAGNOSIS — I69391 Dysphagia following cerebral infarction: Secondary | ICD-10-CM | POA: Diagnosis not present

## 2021-05-05 DIAGNOSIS — I10 Essential (primary) hypertension: Secondary | ICD-10-CM | POA: Diagnosis not present

## 2021-05-05 DIAGNOSIS — R1312 Dysphagia, oropharyngeal phase: Secondary | ICD-10-CM | POA: Diagnosis not present

## 2021-05-05 DIAGNOSIS — I69314 Frontal lobe and executive function deficit following cerebral infarction: Secondary | ICD-10-CM | POA: Diagnosis not present

## 2021-05-05 DIAGNOSIS — I69828 Other speech and language deficits following other cerebrovascular disease: Secondary | ICD-10-CM | POA: Diagnosis not present

## 2021-05-05 DIAGNOSIS — M6281 Muscle weakness (generalized): Secondary | ICD-10-CM | POA: Diagnosis not present

## 2021-05-07 DIAGNOSIS — I69354 Hemiplegia and hemiparesis following cerebral infarction affecting left non-dominant side: Secondary | ICD-10-CM | POA: Diagnosis not present

## 2021-05-07 DIAGNOSIS — I69391 Dysphagia following cerebral infarction: Secondary | ICD-10-CM | POA: Diagnosis not present

## 2021-05-07 DIAGNOSIS — M6281 Muscle weakness (generalized): Secondary | ICD-10-CM | POA: Diagnosis not present

## 2021-05-07 DIAGNOSIS — R1312 Dysphagia, oropharyngeal phase: Secondary | ICD-10-CM | POA: Diagnosis not present

## 2021-05-07 DIAGNOSIS — I69314 Frontal lobe and executive function deficit following cerebral infarction: Secondary | ICD-10-CM | POA: Diagnosis not present

## 2021-05-07 DIAGNOSIS — I69322 Dysarthria following cerebral infarction: Secondary | ICD-10-CM | POA: Diagnosis not present

## 2021-05-07 DIAGNOSIS — I69398 Other sequelae of cerebral infarction: Secondary | ICD-10-CM | POA: Diagnosis not present

## 2021-05-07 DIAGNOSIS — I10 Essential (primary) hypertension: Secondary | ICD-10-CM | POA: Diagnosis not present

## 2021-05-07 DIAGNOSIS — I69828 Other speech and language deficits following other cerebrovascular disease: Secondary | ICD-10-CM | POA: Diagnosis not present

## 2021-05-07 DIAGNOSIS — I509 Heart failure, unspecified: Secondary | ICD-10-CM | POA: Diagnosis not present

## 2021-05-10 DIAGNOSIS — M6281 Muscle weakness (generalized): Secondary | ICD-10-CM | POA: Diagnosis not present

## 2021-05-10 DIAGNOSIS — I509 Heart failure, unspecified: Secondary | ICD-10-CM | POA: Diagnosis not present

## 2021-05-10 DIAGNOSIS — I10 Essential (primary) hypertension: Secondary | ICD-10-CM | POA: Diagnosis not present

## 2021-05-10 DIAGNOSIS — R1312 Dysphagia, oropharyngeal phase: Secondary | ICD-10-CM | POA: Diagnosis not present

## 2021-05-10 DIAGNOSIS — I69314 Frontal lobe and executive function deficit following cerebral infarction: Secondary | ICD-10-CM | POA: Diagnosis not present

## 2021-05-10 DIAGNOSIS — I69398 Other sequelae of cerebral infarction: Secondary | ICD-10-CM | POA: Diagnosis not present

## 2021-05-10 DIAGNOSIS — I69828 Other speech and language deficits following other cerebrovascular disease: Secondary | ICD-10-CM | POA: Diagnosis not present

## 2021-05-10 DIAGNOSIS — I69354 Hemiplegia and hemiparesis following cerebral infarction affecting left non-dominant side: Secondary | ICD-10-CM | POA: Diagnosis not present

## 2021-05-10 DIAGNOSIS — I69322 Dysarthria following cerebral infarction: Secondary | ICD-10-CM | POA: Diagnosis not present

## 2021-05-10 DIAGNOSIS — I69391 Dysphagia following cerebral infarction: Secondary | ICD-10-CM | POA: Diagnosis not present

## 2021-05-12 DIAGNOSIS — R1312 Dysphagia, oropharyngeal phase: Secondary | ICD-10-CM | POA: Diagnosis not present

## 2021-05-12 DIAGNOSIS — M6281 Muscle weakness (generalized): Secondary | ICD-10-CM | POA: Diagnosis not present

## 2021-05-12 DIAGNOSIS — I69314 Frontal lobe and executive function deficit following cerebral infarction: Secondary | ICD-10-CM | POA: Diagnosis not present

## 2021-05-12 DIAGNOSIS — I69828 Other speech and language deficits following other cerebrovascular disease: Secondary | ICD-10-CM | POA: Diagnosis not present

## 2021-05-12 DIAGNOSIS — I10 Essential (primary) hypertension: Secondary | ICD-10-CM | POA: Diagnosis not present

## 2021-05-12 DIAGNOSIS — I69398 Other sequelae of cerebral infarction: Secondary | ICD-10-CM | POA: Diagnosis not present

## 2021-05-12 DIAGNOSIS — I509 Heart failure, unspecified: Secondary | ICD-10-CM | POA: Diagnosis not present

## 2021-05-12 DIAGNOSIS — I69322 Dysarthria following cerebral infarction: Secondary | ICD-10-CM | POA: Diagnosis not present

## 2021-05-12 DIAGNOSIS — I69391 Dysphagia following cerebral infarction: Secondary | ICD-10-CM | POA: Diagnosis not present

## 2021-05-12 DIAGNOSIS — I69354 Hemiplegia and hemiparesis following cerebral infarction affecting left non-dominant side: Secondary | ICD-10-CM | POA: Diagnosis not present

## 2021-05-13 DIAGNOSIS — I69391 Dysphagia following cerebral infarction: Secondary | ICD-10-CM | POA: Diagnosis not present

## 2021-05-13 DIAGNOSIS — I509 Heart failure, unspecified: Secondary | ICD-10-CM | POA: Diagnosis not present

## 2021-05-13 DIAGNOSIS — I10 Essential (primary) hypertension: Secondary | ICD-10-CM | POA: Diagnosis not present

## 2021-05-13 DIAGNOSIS — R1312 Dysphagia, oropharyngeal phase: Secondary | ICD-10-CM | POA: Diagnosis not present

## 2021-05-13 DIAGNOSIS — I69398 Other sequelae of cerebral infarction: Secondary | ICD-10-CM | POA: Diagnosis not present

## 2021-05-13 DIAGNOSIS — I69314 Frontal lobe and executive function deficit following cerebral infarction: Secondary | ICD-10-CM | POA: Diagnosis not present

## 2021-05-13 DIAGNOSIS — I69354 Hemiplegia and hemiparesis following cerebral infarction affecting left non-dominant side: Secondary | ICD-10-CM | POA: Diagnosis not present

## 2021-05-13 DIAGNOSIS — M6281 Muscle weakness (generalized): Secondary | ICD-10-CM | POA: Diagnosis not present

## 2021-05-13 DIAGNOSIS — I69322 Dysarthria following cerebral infarction: Secondary | ICD-10-CM | POA: Diagnosis not present

## 2021-05-13 DIAGNOSIS — I69828 Other speech and language deficits following other cerebrovascular disease: Secondary | ICD-10-CM | POA: Diagnosis not present

## 2021-05-17 DIAGNOSIS — I10 Essential (primary) hypertension: Secondary | ICD-10-CM | POA: Diagnosis not present

## 2021-05-17 DIAGNOSIS — R1312 Dysphagia, oropharyngeal phase: Secondary | ICD-10-CM | POA: Diagnosis not present

## 2021-05-17 DIAGNOSIS — I69354 Hemiplegia and hemiparesis following cerebral infarction affecting left non-dominant side: Secondary | ICD-10-CM | POA: Diagnosis not present

## 2021-05-17 DIAGNOSIS — I69398 Other sequelae of cerebral infarction: Secondary | ICD-10-CM | POA: Diagnosis not present

## 2021-05-17 DIAGNOSIS — I69314 Frontal lobe and executive function deficit following cerebral infarction: Secondary | ICD-10-CM | POA: Diagnosis not present

## 2021-05-17 DIAGNOSIS — I69391 Dysphagia following cerebral infarction: Secondary | ICD-10-CM | POA: Diagnosis not present

## 2021-05-17 DIAGNOSIS — M6281 Muscle weakness (generalized): Secondary | ICD-10-CM | POA: Diagnosis not present

## 2021-05-17 DIAGNOSIS — I509 Heart failure, unspecified: Secondary | ICD-10-CM | POA: Diagnosis not present

## 2021-05-17 DIAGNOSIS — I69322 Dysarthria following cerebral infarction: Secondary | ICD-10-CM | POA: Diagnosis not present

## 2021-05-17 DIAGNOSIS — I69828 Other speech and language deficits following other cerebrovascular disease: Secondary | ICD-10-CM | POA: Diagnosis not present

## 2021-05-18 DIAGNOSIS — I69314 Frontal lobe and executive function deficit following cerebral infarction: Secondary | ICD-10-CM | POA: Diagnosis not present

## 2021-05-18 DIAGNOSIS — R1312 Dysphagia, oropharyngeal phase: Secondary | ICD-10-CM | POA: Diagnosis not present

## 2021-05-18 DIAGNOSIS — I69828 Other speech and language deficits following other cerebrovascular disease: Secondary | ICD-10-CM | POA: Diagnosis not present

## 2021-05-18 DIAGNOSIS — I69354 Hemiplegia and hemiparesis following cerebral infarction affecting left non-dominant side: Secondary | ICD-10-CM | POA: Diagnosis not present

## 2021-05-18 DIAGNOSIS — M6281 Muscle weakness (generalized): Secondary | ICD-10-CM | POA: Diagnosis not present

## 2021-05-18 DIAGNOSIS — I69391 Dysphagia following cerebral infarction: Secondary | ICD-10-CM | POA: Diagnosis not present

## 2021-05-18 DIAGNOSIS — I10 Essential (primary) hypertension: Secondary | ICD-10-CM | POA: Diagnosis not present

## 2021-05-18 DIAGNOSIS — I69398 Other sequelae of cerebral infarction: Secondary | ICD-10-CM | POA: Diagnosis not present

## 2021-05-18 DIAGNOSIS — I509 Heart failure, unspecified: Secondary | ICD-10-CM | POA: Diagnosis not present

## 2021-05-18 DIAGNOSIS — I69322 Dysarthria following cerebral infarction: Secondary | ICD-10-CM | POA: Diagnosis not present

## 2021-05-19 DIAGNOSIS — M21619 Bunion of unspecified foot: Secondary | ICD-10-CM | POA: Diagnosis not present

## 2021-05-19 DIAGNOSIS — I10 Essential (primary) hypertension: Secondary | ICD-10-CM | POA: Diagnosis not present

## 2021-05-19 DIAGNOSIS — I69828 Other speech and language deficits following other cerebrovascular disease: Secondary | ICD-10-CM | POA: Diagnosis not present

## 2021-05-19 DIAGNOSIS — I69322 Dysarthria following cerebral infarction: Secondary | ICD-10-CM | POA: Diagnosis not present

## 2021-05-19 DIAGNOSIS — M79672 Pain in left foot: Secondary | ICD-10-CM | POA: Diagnosis not present

## 2021-05-19 DIAGNOSIS — I69354 Hemiplegia and hemiparesis following cerebral infarction affecting left non-dominant side: Secondary | ICD-10-CM | POA: Diagnosis not present

## 2021-05-19 DIAGNOSIS — I69398 Other sequelae of cerebral infarction: Secondary | ICD-10-CM | POA: Diagnosis not present

## 2021-05-19 DIAGNOSIS — M2010 Hallux valgus (acquired), unspecified foot: Secondary | ICD-10-CM | POA: Diagnosis not present

## 2021-05-19 DIAGNOSIS — M79671 Pain in right foot: Secondary | ICD-10-CM | POA: Diagnosis not present

## 2021-05-19 DIAGNOSIS — M2042 Other hammer toe(s) (acquired), left foot: Secondary | ICD-10-CM | POA: Diagnosis not present

## 2021-05-19 DIAGNOSIS — M6281 Muscle weakness (generalized): Secondary | ICD-10-CM | POA: Diagnosis not present

## 2021-05-19 DIAGNOSIS — I69314 Frontal lobe and executive function deficit following cerebral infarction: Secondary | ICD-10-CM | POA: Diagnosis not present

## 2021-05-19 DIAGNOSIS — M2041 Other hammer toe(s) (acquired), right foot: Secondary | ICD-10-CM | POA: Diagnosis not present

## 2021-05-19 DIAGNOSIS — B351 Tinea unguium: Secondary | ICD-10-CM | POA: Diagnosis not present

## 2021-05-19 DIAGNOSIS — L600 Ingrowing nail: Secondary | ICD-10-CM | POA: Diagnosis not present

## 2021-05-19 DIAGNOSIS — I69391 Dysphagia following cerebral infarction: Secondary | ICD-10-CM | POA: Diagnosis not present

## 2021-05-19 DIAGNOSIS — I509 Heart failure, unspecified: Secondary | ICD-10-CM | POA: Diagnosis not present

## 2021-05-19 DIAGNOSIS — R1312 Dysphagia, oropharyngeal phase: Secondary | ICD-10-CM | POA: Diagnosis not present

## 2021-05-21 DIAGNOSIS — E119 Type 2 diabetes mellitus without complications: Secondary | ICD-10-CM | POA: Diagnosis not present

## 2021-05-21 DIAGNOSIS — R1312 Dysphagia, oropharyngeal phase: Secondary | ICD-10-CM | POA: Diagnosis not present

## 2021-05-21 DIAGNOSIS — I509 Heart failure, unspecified: Secondary | ICD-10-CM | POA: Diagnosis not present

## 2021-05-21 DIAGNOSIS — I69391 Dysphagia following cerebral infarction: Secondary | ICD-10-CM | POA: Diagnosis not present

## 2021-05-21 DIAGNOSIS — E038 Other specified hypothyroidism: Secondary | ICD-10-CM | POA: Diagnosis not present

## 2021-05-21 DIAGNOSIS — I10 Essential (primary) hypertension: Secondary | ICD-10-CM | POA: Diagnosis not present

## 2021-05-21 DIAGNOSIS — M6281 Muscle weakness (generalized): Secondary | ICD-10-CM | POA: Diagnosis not present

## 2021-05-21 DIAGNOSIS — D518 Other vitamin B12 deficiency anemias: Secondary | ICD-10-CM | POA: Diagnosis not present

## 2021-05-21 DIAGNOSIS — I69828 Other speech and language deficits following other cerebrovascular disease: Secondary | ICD-10-CM | POA: Diagnosis not present

## 2021-05-21 DIAGNOSIS — I69322 Dysarthria following cerebral infarction: Secondary | ICD-10-CM | POA: Diagnosis not present

## 2021-05-21 DIAGNOSIS — I69398 Other sequelae of cerebral infarction: Secondary | ICD-10-CM | POA: Diagnosis not present

## 2021-05-21 DIAGNOSIS — I69314 Frontal lobe and executive function deficit following cerebral infarction: Secondary | ICD-10-CM | POA: Diagnosis not present

## 2021-05-21 DIAGNOSIS — E559 Vitamin D deficiency, unspecified: Secondary | ICD-10-CM | POA: Diagnosis not present

## 2021-05-21 DIAGNOSIS — I69354 Hemiplegia and hemiparesis following cerebral infarction affecting left non-dominant side: Secondary | ICD-10-CM | POA: Diagnosis not present

## 2021-05-24 DIAGNOSIS — I69398 Other sequelae of cerebral infarction: Secondary | ICD-10-CM | POA: Diagnosis not present

## 2021-05-24 DIAGNOSIS — I69391 Dysphagia following cerebral infarction: Secondary | ICD-10-CM | POA: Diagnosis not present

## 2021-05-24 DIAGNOSIS — M6281 Muscle weakness (generalized): Secondary | ICD-10-CM | POA: Diagnosis not present

## 2021-05-24 DIAGNOSIS — I69354 Hemiplegia and hemiparesis following cerebral infarction affecting left non-dominant side: Secondary | ICD-10-CM | POA: Diagnosis not present

## 2021-05-24 DIAGNOSIS — I69322 Dysarthria following cerebral infarction: Secondary | ICD-10-CM | POA: Diagnosis not present

## 2021-05-24 DIAGNOSIS — I69314 Frontal lobe and executive function deficit following cerebral infarction: Secondary | ICD-10-CM | POA: Diagnosis not present

## 2021-05-24 DIAGNOSIS — R1312 Dysphagia, oropharyngeal phase: Secondary | ICD-10-CM | POA: Diagnosis not present

## 2021-05-24 DIAGNOSIS — I10 Essential (primary) hypertension: Secondary | ICD-10-CM | POA: Diagnosis not present

## 2021-05-24 DIAGNOSIS — I509 Heart failure, unspecified: Secondary | ICD-10-CM | POA: Diagnosis not present

## 2021-05-24 DIAGNOSIS — I69828 Other speech and language deficits following other cerebrovascular disease: Secondary | ICD-10-CM | POA: Diagnosis not present

## 2021-05-25 DIAGNOSIS — I509 Heart failure, unspecified: Secondary | ICD-10-CM | POA: Diagnosis not present

## 2021-05-25 DIAGNOSIS — I69828 Other speech and language deficits following other cerebrovascular disease: Secondary | ICD-10-CM | POA: Diagnosis not present

## 2021-05-25 DIAGNOSIS — I69354 Hemiplegia and hemiparesis following cerebral infarction affecting left non-dominant side: Secondary | ICD-10-CM | POA: Diagnosis not present

## 2021-05-25 DIAGNOSIS — I10 Essential (primary) hypertension: Secondary | ICD-10-CM | POA: Diagnosis not present

## 2021-05-25 DIAGNOSIS — I69391 Dysphagia following cerebral infarction: Secondary | ICD-10-CM | POA: Diagnosis not present

## 2021-05-25 DIAGNOSIS — I69314 Frontal lobe and executive function deficit following cerebral infarction: Secondary | ICD-10-CM | POA: Diagnosis not present

## 2021-05-25 DIAGNOSIS — I69322 Dysarthria following cerebral infarction: Secondary | ICD-10-CM | POA: Diagnosis not present

## 2021-05-25 DIAGNOSIS — M6281 Muscle weakness (generalized): Secondary | ICD-10-CM | POA: Diagnosis not present

## 2021-05-25 DIAGNOSIS — R1312 Dysphagia, oropharyngeal phase: Secondary | ICD-10-CM | POA: Diagnosis not present

## 2021-05-25 DIAGNOSIS — I69398 Other sequelae of cerebral infarction: Secondary | ICD-10-CM | POA: Diagnosis not present

## 2021-05-26 DIAGNOSIS — I69314 Frontal lobe and executive function deficit following cerebral infarction: Secondary | ICD-10-CM | POA: Diagnosis not present

## 2021-05-26 DIAGNOSIS — I69322 Dysarthria following cerebral infarction: Secondary | ICD-10-CM | POA: Diagnosis not present

## 2021-05-26 DIAGNOSIS — I69391 Dysphagia following cerebral infarction: Secondary | ICD-10-CM | POA: Diagnosis not present

## 2021-05-26 DIAGNOSIS — I69398 Other sequelae of cerebral infarction: Secondary | ICD-10-CM | POA: Diagnosis not present

## 2021-05-26 DIAGNOSIS — I69828 Other speech and language deficits following other cerebrovascular disease: Secondary | ICD-10-CM | POA: Diagnosis not present

## 2021-05-26 DIAGNOSIS — M6281 Muscle weakness (generalized): Secondary | ICD-10-CM | POA: Diagnosis not present

## 2021-05-26 DIAGNOSIS — R1312 Dysphagia, oropharyngeal phase: Secondary | ICD-10-CM | POA: Diagnosis not present

## 2021-05-26 DIAGNOSIS — I509 Heart failure, unspecified: Secondary | ICD-10-CM | POA: Diagnosis not present

## 2021-05-26 DIAGNOSIS — I69354 Hemiplegia and hemiparesis following cerebral infarction affecting left non-dominant side: Secondary | ICD-10-CM | POA: Diagnosis not present

## 2021-05-26 DIAGNOSIS — I10 Essential (primary) hypertension: Secondary | ICD-10-CM | POA: Diagnosis not present

## 2021-05-27 DIAGNOSIS — I69391 Dysphagia following cerebral infarction: Secondary | ICD-10-CM | POA: Diagnosis not present

## 2021-05-27 DIAGNOSIS — I69322 Dysarthria following cerebral infarction: Secondary | ICD-10-CM | POA: Diagnosis not present

## 2021-05-27 DIAGNOSIS — M6281 Muscle weakness (generalized): Secondary | ICD-10-CM | POA: Diagnosis not present

## 2021-05-27 DIAGNOSIS — I69354 Hemiplegia and hemiparesis following cerebral infarction affecting left non-dominant side: Secondary | ICD-10-CM | POA: Diagnosis not present

## 2021-05-27 DIAGNOSIS — I69314 Frontal lobe and executive function deficit following cerebral infarction: Secondary | ICD-10-CM | POA: Diagnosis not present

## 2021-05-27 DIAGNOSIS — I69398 Other sequelae of cerebral infarction: Secondary | ICD-10-CM | POA: Diagnosis not present

## 2021-05-27 DIAGNOSIS — R1312 Dysphagia, oropharyngeal phase: Secondary | ICD-10-CM | POA: Diagnosis not present

## 2021-05-27 DIAGNOSIS — I10 Essential (primary) hypertension: Secondary | ICD-10-CM | POA: Diagnosis not present

## 2021-05-27 DIAGNOSIS — I509 Heart failure, unspecified: Secondary | ICD-10-CM | POA: Diagnosis not present

## 2021-05-27 DIAGNOSIS — J449 Chronic obstructive pulmonary disease, unspecified: Secondary | ICD-10-CM | POA: Diagnosis not present

## 2021-05-27 DIAGNOSIS — I69828 Other speech and language deficits following other cerebrovascular disease: Secondary | ICD-10-CM | POA: Diagnosis not present

## 2021-05-31 DIAGNOSIS — R1312 Dysphagia, oropharyngeal phase: Secondary | ICD-10-CM | POA: Diagnosis not present

## 2021-05-31 DIAGNOSIS — I69828 Other speech and language deficits following other cerebrovascular disease: Secondary | ICD-10-CM | POA: Diagnosis not present

## 2021-05-31 DIAGNOSIS — I69314 Frontal lobe and executive function deficit following cerebral infarction: Secondary | ICD-10-CM | POA: Diagnosis not present

## 2021-05-31 DIAGNOSIS — I69322 Dysarthria following cerebral infarction: Secondary | ICD-10-CM | POA: Diagnosis not present

## 2021-05-31 DIAGNOSIS — I509 Heart failure, unspecified: Secondary | ICD-10-CM | POA: Diagnosis not present

## 2021-05-31 DIAGNOSIS — I69398 Other sequelae of cerebral infarction: Secondary | ICD-10-CM | POA: Diagnosis not present

## 2021-05-31 DIAGNOSIS — I69354 Hemiplegia and hemiparesis following cerebral infarction affecting left non-dominant side: Secondary | ICD-10-CM | POA: Diagnosis not present

## 2021-05-31 DIAGNOSIS — M6281 Muscle weakness (generalized): Secondary | ICD-10-CM | POA: Diagnosis not present

## 2021-05-31 DIAGNOSIS — I10 Essential (primary) hypertension: Secondary | ICD-10-CM | POA: Diagnosis not present

## 2021-05-31 DIAGNOSIS — I69391 Dysphagia following cerebral infarction: Secondary | ICD-10-CM | POA: Diagnosis not present

## 2021-06-01 DIAGNOSIS — I69322 Dysarthria following cerebral infarction: Secondary | ICD-10-CM | POA: Diagnosis not present

## 2021-06-01 DIAGNOSIS — I509 Heart failure, unspecified: Secondary | ICD-10-CM | POA: Diagnosis not present

## 2021-06-01 DIAGNOSIS — I69314 Frontal lobe and executive function deficit following cerebral infarction: Secondary | ICD-10-CM | POA: Diagnosis not present

## 2021-06-01 DIAGNOSIS — I69354 Hemiplegia and hemiparesis following cerebral infarction affecting left non-dominant side: Secondary | ICD-10-CM | POA: Diagnosis not present

## 2021-06-01 DIAGNOSIS — I82409 Acute embolism and thrombosis of unspecified deep veins of unspecified lower extremity: Secondary | ICD-10-CM | POA: Diagnosis not present

## 2021-06-01 DIAGNOSIS — I739 Peripheral vascular disease, unspecified: Secondary | ICD-10-CM | POA: Diagnosis not present

## 2021-06-01 DIAGNOSIS — I10 Essential (primary) hypertension: Secondary | ICD-10-CM | POA: Diagnosis not present

## 2021-06-01 DIAGNOSIS — N1831 Chronic kidney disease, stage 3a: Secondary | ICD-10-CM | POA: Diagnosis not present

## 2021-06-01 DIAGNOSIS — M25511 Pain in right shoulder: Secondary | ICD-10-CM | POA: Diagnosis not present

## 2021-06-01 DIAGNOSIS — M6281 Muscle weakness (generalized): Secondary | ICD-10-CM | POA: Diagnosis not present

## 2021-06-01 DIAGNOSIS — I69828 Other speech and language deficits following other cerebrovascular disease: Secondary | ICD-10-CM | POA: Diagnosis not present

## 2021-06-01 DIAGNOSIS — K5901 Slow transit constipation: Secondary | ICD-10-CM | POA: Diagnosis not present

## 2021-06-01 DIAGNOSIS — R1312 Dysphagia, oropharyngeal phase: Secondary | ICD-10-CM | POA: Diagnosis not present

## 2021-06-01 DIAGNOSIS — I69391 Dysphagia following cerebral infarction: Secondary | ICD-10-CM | POA: Diagnosis not present

## 2021-06-01 DIAGNOSIS — I69398 Other sequelae of cerebral infarction: Secondary | ICD-10-CM | POA: Diagnosis not present

## 2021-06-02 DIAGNOSIS — I69354 Hemiplegia and hemiparesis following cerebral infarction affecting left non-dominant side: Secondary | ICD-10-CM | POA: Diagnosis not present

## 2021-06-02 DIAGNOSIS — I69391 Dysphagia following cerebral infarction: Secondary | ICD-10-CM | POA: Diagnosis not present

## 2021-06-02 DIAGNOSIS — I509 Heart failure, unspecified: Secondary | ICD-10-CM | POA: Diagnosis not present

## 2021-06-02 DIAGNOSIS — I69828 Other speech and language deficits following other cerebrovascular disease: Secondary | ICD-10-CM | POA: Diagnosis not present

## 2021-06-02 DIAGNOSIS — I69398 Other sequelae of cerebral infarction: Secondary | ICD-10-CM | POA: Diagnosis not present

## 2021-06-02 DIAGNOSIS — R1312 Dysphagia, oropharyngeal phase: Secondary | ICD-10-CM | POA: Diagnosis not present

## 2021-06-02 DIAGNOSIS — M6281 Muscle weakness (generalized): Secondary | ICD-10-CM | POA: Diagnosis not present

## 2021-06-02 DIAGNOSIS — I69322 Dysarthria following cerebral infarction: Secondary | ICD-10-CM | POA: Diagnosis not present

## 2021-06-02 DIAGNOSIS — I10 Essential (primary) hypertension: Secondary | ICD-10-CM | POA: Diagnosis not present

## 2021-06-02 DIAGNOSIS — I69314 Frontal lobe and executive function deficit following cerebral infarction: Secondary | ICD-10-CM | POA: Diagnosis not present

## 2021-06-07 DIAGNOSIS — R1312 Dysphagia, oropharyngeal phase: Secondary | ICD-10-CM | POA: Diagnosis not present

## 2021-06-07 DIAGNOSIS — I10 Essential (primary) hypertension: Secondary | ICD-10-CM | POA: Diagnosis not present

## 2021-06-07 DIAGNOSIS — I69354 Hemiplegia and hemiparesis following cerebral infarction affecting left non-dominant side: Secondary | ICD-10-CM | POA: Diagnosis not present

## 2021-06-07 DIAGNOSIS — M6281 Muscle weakness (generalized): Secondary | ICD-10-CM | POA: Diagnosis not present

## 2021-06-07 DIAGNOSIS — I69314 Frontal lobe and executive function deficit following cerebral infarction: Secondary | ICD-10-CM | POA: Diagnosis not present

## 2021-06-07 DIAGNOSIS — I69828 Other speech and language deficits following other cerebrovascular disease: Secondary | ICD-10-CM | POA: Diagnosis not present

## 2021-06-07 DIAGNOSIS — I509 Heart failure, unspecified: Secondary | ICD-10-CM | POA: Diagnosis not present

## 2021-06-07 DIAGNOSIS — I69322 Dysarthria following cerebral infarction: Secondary | ICD-10-CM | POA: Diagnosis not present

## 2021-06-07 DIAGNOSIS — I69398 Other sequelae of cerebral infarction: Secondary | ICD-10-CM | POA: Diagnosis not present

## 2021-06-07 DIAGNOSIS — I69391 Dysphagia following cerebral infarction: Secondary | ICD-10-CM | POA: Diagnosis not present

## 2021-06-08 DIAGNOSIS — I69354 Hemiplegia and hemiparesis following cerebral infarction affecting left non-dominant side: Secondary | ICD-10-CM | POA: Diagnosis not present

## 2021-06-08 DIAGNOSIS — I69314 Frontal lobe and executive function deficit following cerebral infarction: Secondary | ICD-10-CM | POA: Diagnosis not present

## 2021-06-08 DIAGNOSIS — M6281 Muscle weakness (generalized): Secondary | ICD-10-CM | POA: Diagnosis not present

## 2021-06-08 DIAGNOSIS — I69828 Other speech and language deficits following other cerebrovascular disease: Secondary | ICD-10-CM | POA: Diagnosis not present

## 2021-06-08 DIAGNOSIS — R1312 Dysphagia, oropharyngeal phase: Secondary | ICD-10-CM | POA: Diagnosis not present

## 2021-06-08 DIAGNOSIS — I509 Heart failure, unspecified: Secondary | ICD-10-CM | POA: Diagnosis not present

## 2021-06-08 DIAGNOSIS — I10 Essential (primary) hypertension: Secondary | ICD-10-CM | POA: Diagnosis not present

## 2021-06-08 DIAGNOSIS — I69391 Dysphagia following cerebral infarction: Secondary | ICD-10-CM | POA: Diagnosis not present

## 2021-06-08 DIAGNOSIS — I69398 Other sequelae of cerebral infarction: Secondary | ICD-10-CM | POA: Diagnosis not present

## 2021-06-08 DIAGNOSIS — I69322 Dysarthria following cerebral infarction: Secondary | ICD-10-CM | POA: Diagnosis not present

## 2021-06-09 ENCOUNTER — Inpatient Hospital Stay
Admission: EM | Admit: 2021-06-09 | Discharge: 2021-06-11 | DRG: 246 | Disposition: A | Discharge status: Skilled Nursing Facility | Payer: Medicare Other | Source: Skilled Nursing Facility | Attending: Hospitalist | Admitting: Hospitalist

## 2021-06-09 ENCOUNTER — Encounter
Admission: EM | Disposition: A | Discharge status: Skilled Nursing Facility | Payer: Self-pay | Source: Skilled Nursing Facility | Attending: Neurology

## 2021-06-09 ENCOUNTER — Encounter: Payer: Self-pay | Admitting: Cardiology

## 2021-06-09 ENCOUNTER — Emergency Department: Payer: Medicare Other

## 2021-06-09 DIAGNOSIS — N179 Acute kidney failure, unspecified: Secondary | ICD-10-CM | POA: Diagnosis present

## 2021-06-09 DIAGNOSIS — I69354 Hemiplegia and hemiparesis following cerebral infarction affecting left non-dominant side: Secondary | ICD-10-CM | POA: Diagnosis not present

## 2021-06-09 DIAGNOSIS — T82855A Stenosis of coronary artery stent, initial encounter: Principal | ICD-10-CM | POA: Diagnosis present

## 2021-06-09 DIAGNOSIS — I213 ST elevation (STEMI) myocardial infarction of unspecified site: Secondary | ICD-10-CM | POA: Diagnosis not present

## 2021-06-09 DIAGNOSIS — Y831 Surgical operation with implant of artificial internal device as the cause of abnormal reaction of the patient, or of later complication, without mention of misadventure at the time of the procedure: Secondary | ICD-10-CM | POA: Diagnosis present

## 2021-06-09 DIAGNOSIS — I2102 ST elevation (STEMI) myocardial infarction involving left anterior descending coronary artery: Secondary | ICD-10-CM

## 2021-06-09 DIAGNOSIS — I959 Hypotension, unspecified: Secondary | ICD-10-CM | POA: Diagnosis present

## 2021-06-09 DIAGNOSIS — I252 Old myocardial infarction: Secondary | ICD-10-CM

## 2021-06-09 DIAGNOSIS — I5041 Acute combined systolic (congestive) and diastolic (congestive) heart failure: Secondary | ICD-10-CM | POA: Diagnosis not present

## 2021-06-09 DIAGNOSIS — E876 Hypokalemia: Secondary | ICD-10-CM | POA: Diagnosis not present

## 2021-06-09 DIAGNOSIS — K219 Gastro-esophageal reflux disease without esophagitis: Secondary | ICD-10-CM | POA: Insufficient documentation

## 2021-06-09 DIAGNOSIS — I2510 Atherosclerotic heart disease of native coronary artery without angina pectoris: Secondary | ICD-10-CM | POA: Diagnosis not present

## 2021-06-09 DIAGNOSIS — I69391 Dysphagia following cerebral infarction: Secondary | ICD-10-CM | POA: Diagnosis not present

## 2021-06-09 DIAGNOSIS — R7303 Prediabetes: Secondary | ICD-10-CM | POA: Diagnosis not present

## 2021-06-09 DIAGNOSIS — I10 Essential (primary) hypertension: Secondary | ICD-10-CM | POA: Diagnosis not present

## 2021-06-09 DIAGNOSIS — K5900 Constipation, unspecified: Secondary | ICD-10-CM | POA: Diagnosis not present

## 2021-06-09 DIAGNOSIS — I69322 Dysarthria following cerebral infarction: Secondary | ICD-10-CM | POA: Diagnosis not present

## 2021-06-09 DIAGNOSIS — I420 Dilated cardiomyopathy: Secondary | ICD-10-CM | POA: Diagnosis present

## 2021-06-09 DIAGNOSIS — R0689 Other abnormalities of breathing: Secondary | ICD-10-CM | POA: Diagnosis not present

## 2021-06-09 DIAGNOSIS — D518 Other vitamin B12 deficiency anemias: Secondary | ICD-10-CM | POA: Insufficient documentation

## 2021-06-09 DIAGNOSIS — M6281 Muscle weakness (generalized): Secondary | ICD-10-CM | POA: Diagnosis not present

## 2021-06-09 DIAGNOSIS — I499 Cardiac arrhythmia, unspecified: Secondary | ICD-10-CM | POA: Diagnosis not present

## 2021-06-09 DIAGNOSIS — I2109 ST elevation (STEMI) myocardial infarction involving other coronary artery of anterior wall: Secondary | ICD-10-CM | POA: Diagnosis not present

## 2021-06-09 DIAGNOSIS — E119 Type 2 diabetes mellitus without complications: Secondary | ICD-10-CM | POA: Insufficient documentation

## 2021-06-09 DIAGNOSIS — I469 Cardiac arrest, cause unspecified: Secondary | ICD-10-CM | POA: Diagnosis not present

## 2021-06-09 DIAGNOSIS — R6889 Other general symptoms and signs: Secondary | ICD-10-CM | POA: Diagnosis not present

## 2021-06-09 DIAGNOSIS — I69828 Other speech and language deficits following other cerebrovascular disease: Secondary | ICD-10-CM | POA: Diagnosis not present

## 2021-06-09 DIAGNOSIS — N1831 Chronic kidney disease, stage 3a: Secondary | ICD-10-CM | POA: Insufficient documentation

## 2021-06-09 DIAGNOSIS — I69398 Other sequelae of cerebral infarction: Secondary | ICD-10-CM | POA: Diagnosis not present

## 2021-06-09 DIAGNOSIS — N400 Enlarged prostate without lower urinary tract symptoms: Secondary | ICD-10-CM | POA: Insufficient documentation

## 2021-06-09 DIAGNOSIS — I509 Heart failure, unspecified: Secondary | ICD-10-CM | POA: Diagnosis not present

## 2021-06-09 DIAGNOSIS — I739 Peripheral vascular disease, unspecified: Secondary | ICD-10-CM | POA: Insufficient documentation

## 2021-06-09 DIAGNOSIS — Z743 Need for continuous supervision: Secondary | ICD-10-CM | POA: Diagnosis not present

## 2021-06-09 DIAGNOSIS — Z9861 Coronary angioplasty status: Secondary | ICD-10-CM | POA: Diagnosis not present

## 2021-06-09 DIAGNOSIS — R1312 Dysphagia, oropharyngeal phase: Secondary | ICD-10-CM | POA: Diagnosis not present

## 2021-06-09 DIAGNOSIS — I69314 Frontal lobe and executive function deficit following cerebral infarction: Secondary | ICD-10-CM | POA: Diagnosis not present

## 2021-06-09 HISTORY — PX: LEFT HEART CATH AND CORONARY ANGIOGRAPHY: CATH118249

## 2021-06-09 HISTORY — PX: CORONARY/GRAFT ACUTE MI REVASCULARIZATION: CATH118305

## 2021-06-09 LAB — CBC WITH DIFFERENTIAL/PLATELET
Abs Immature Granulocytes: 0.02 10*3/uL (ref 0.00–0.07)
Basophils Absolute: 0 10*3/uL (ref 0.0–0.1)
Basophils Relative: 0 %
Eosinophils Absolute: 0.2 10*3/uL (ref 0.0–0.5)
Eosinophils Relative: 2 %
HCT: 36.7 % — ABNORMAL LOW (ref 39.0–52.0)
Hemoglobin: 12.1 g/dL — ABNORMAL LOW (ref 13.0–17.0)
Immature Granulocytes: 0 %
Lymphocytes Relative: 57 %
Lymphs Abs: 3.8 10*3/uL (ref 0.7–4.0)
MCH: 30.7 pg (ref 26.0–34.0)
MCHC: 33 g/dL (ref 30.0–36.0)
MCV: 93.1 fL (ref 80.0–100.0)
Monocytes Absolute: 0.5 10*3/uL (ref 0.1–1.0)
Monocytes Relative: 7 %
Neutro Abs: 2.4 10*3/uL (ref 1.7–7.7)
Neutrophils Relative %: 34 %
Platelets: 188 10*3/uL (ref 150–400)
RBC: 3.94 MIL/uL — ABNORMAL LOW (ref 4.22–5.81)
RDW: 13.2 % (ref 11.5–15.5)
WBC: 6.9 10*3/uL (ref 4.0–10.5)
nRBC: 0 % (ref 0.0–0.2)

## 2021-06-09 LAB — TROPONIN I (HIGH SENSITIVITY)
Troponin I (High Sensitivity): 23 ng/L — ABNORMAL HIGH (ref <18)
Troponin I (High Sensitivity): 57 ng/L — ABNORMAL HIGH (ref <18)

## 2021-06-09 LAB — LIPID PANEL
Cholesterol: 96 mg/dL (ref 0–200)
HDL: 31 mg/dL — ABNORMAL LOW (ref >40)
LDL Cholesterol: 51 mg/dL (ref 0–99)
Total CHOL/HDL Ratio: 3.1 RATIO
Triglycerides: 72 mg/dL (ref <150)
VLDL: 14 mg/dL (ref 0–40)

## 2021-06-09 LAB — COMPREHENSIVE METABOLIC PANEL
ALT: 20 U/L (ref 0–44)
AST: 23 U/L (ref 15–41)
Albumin: 4 g/dL (ref 3.5–5.0)
Alkaline Phosphatase: 44 U/L (ref 38–126)
Anion gap: 10 (ref 5–15)
BUN: 16 mg/dL (ref 8–23)
CO2: 25 mmol/L (ref 22–32)
Calcium: 9.4 mg/dL (ref 8.9–10.3)
Chloride: 102 mmol/L (ref 98–111)
Creatinine, Ser: 1.35 mg/dL — ABNORMAL HIGH (ref 0.61–1.24)
GFR, Estimated: 52 mL/min — ABNORMAL LOW (ref >60)
Glucose, Bld: 200 mg/dL — ABNORMAL HIGH (ref 70–99)
Potassium: 3.2 mmol/L — ABNORMAL LOW (ref 3.5–5.1)
Sodium: 137 mmol/L (ref 135–145)
Total Bilirubin: 1 mg/dL (ref 0.3–1.2)
Total Protein: 7.3 g/dL (ref 6.5–8.1)

## 2021-06-09 LAB — PROTIME-INR
INR: 1.2 (ref 0.8–1.2)
Prothrombin Time: 14.7 seconds (ref 11.4–15.2)

## 2021-06-09 LAB — MRSA NEXT GEN BY PCR, NASAL: MRSA by PCR Next Gen: NOT DETECTED

## 2021-06-09 LAB — APTT: aPTT: 31 seconds (ref 24–36)

## 2021-06-09 LAB — POCT ACTIVATED CLOTTING TIME
Activated Clotting Time: 233 seconds
Activated Clotting Time: 311 seconds
Activated Clotting Time: 546 seconds

## 2021-06-09 SURGERY — CORONARY/GRAFT ACUTE MI REVASCULARIZATION
Anesthesia: Moderate Sedation

## 2021-06-09 MED ORDER — SODIUM CHLORIDE 0.9 % IV SOLN: 250.0000 mL | INTRAVENOUS | Status: DC

## 2021-06-09 MED ORDER — VERAPAMIL HCL 2.5 MG/ML IV SOLN
INTRAVENOUS | Status: DC | PRN
  Administered 2021-06-09: 2.5 mg via INTRAVENOUS

## 2021-06-09 MED ORDER — MIDAZOLAM HCL 2 MG/2ML IJ SOLN
INTRAMUSCULAR | Status: AC
  Filled 2021-06-09: qty 2

## 2021-06-09 MED ORDER — SODIUM CHLORIDE 0.9% FLUSH
3.0000 mL | Freq: Two times a day (BID) | INTRAVENOUS | Status: DC
  Administered 2021-06-10 – 2021-06-11 (×3): 3 mL via INTRAVENOUS

## 2021-06-09 MED ORDER — HEPARIN SOD (PORK) LOCK FLUSH 10 UNIT/ML IV SOLN: 4000.0000 [IU] | Freq: Once | INTRAVENOUS | Status: DC

## 2021-06-09 MED ORDER — SODIUM CHLORIDE 0.9 % WEIGHT BASED INFUSION
1.0000 mL/kg/h | INTRAVENOUS | Status: AC
  Administered 2021-06-09: 1 mL/kg/h via INTRAVENOUS

## 2021-06-09 MED ORDER — NOREPINEPHRINE 4 MG/250ML-% IV SOLN
INTRAVENOUS | Status: AC
  Filled 2021-06-09: qty 250

## 2021-06-09 MED ORDER — ASPIRIN 81 MG PO CHEW: 324.0000 mg | CHEWABLE_TABLET | Freq: Once | ORAL | Status: DC

## 2021-06-09 MED ORDER — SODIUM CHLORIDE 0.9 % IV BOLUS
500.0000 mL | Freq: Once | INTRAVENOUS | Status: AC
  Administered 2021-06-09: 500 mL via INTRAVENOUS

## 2021-06-09 MED ORDER — SODIUM CHLORIDE 0.9% FLUSH: 3.0000 mL | INTRAVENOUS | Status: DC | PRN

## 2021-06-09 MED ORDER — MIDAZOLAM HCL 2 MG/2ML IJ SOLN
INTRAMUSCULAR | Status: DC | PRN
  Administered 2021-06-09: .5 mg via INTRAVENOUS

## 2021-06-09 MED ORDER — LIDOCAINE HCL (PF) 1 % IJ SOLN
INTRAMUSCULAR | Status: DC | PRN
  Administered 2021-06-09: 2 mL

## 2021-06-09 MED ORDER — IOHEXOL 300 MG/ML  SOLN
INTRAMUSCULAR | Status: DC | PRN
  Administered 2021-06-09: 225 mL

## 2021-06-09 MED ORDER — LIDOCAINE HCL 1 % IJ SOLN
INTRAMUSCULAR | Status: AC
  Filled 2021-06-09: qty 20

## 2021-06-09 MED ORDER — ASPIRIN 81 MG PO CHEW
81.0000 mg | CHEWABLE_TABLET | Freq: Every day | ORAL | Status: DC
  Administered 2021-06-10 – 2021-06-11 (×2): 81 mg via ORAL
  Filled 2021-06-09 (×2): qty 1

## 2021-06-09 MED ORDER — METOPROLOL SUCCINATE ER 50 MG PO TB24
25.0000 mg | ORAL_TABLET | Freq: Every day | ORAL | Status: DC
Start: 2021-06-09 — End: 2021-06-11
  Administered 2021-06-09 – 2021-06-11 (×3): 25 mg via ORAL
  Filled 2021-06-09 (×3): qty 1

## 2021-06-09 MED ORDER — ACETAMINOPHEN 325 MG PO TABS
650.0000 mg | ORAL_TABLET | ORAL | Status: DC | PRN
  Administered 2021-06-10: 650 mg via ORAL
  Filled 2021-06-09: qty 2

## 2021-06-09 MED ORDER — ATORVASTATIN CALCIUM 80 MG PO TABS
80.0000 mg | ORAL_TABLET | Freq: Every day | ORAL | Status: DC
  Administered 2021-06-09 – 2021-06-11 (×3): 80 mg via ORAL
  Filled 2021-06-09 (×3): qty 1

## 2021-06-09 MED ORDER — HEPARIN SODIUM (PORCINE) 5000 UNIT/ML IJ SOLN
60.0000 [IU]/kg | Freq: Once | INTRAMUSCULAR | Status: AC
  Administered 2021-06-09: 3950 [IU] via INTRAVENOUS

## 2021-06-09 MED ORDER — VERAPAMIL HCL 2.5 MG/ML IV SOLN
INTRAVENOUS | Status: AC
  Filled 2021-06-09: qty 2

## 2021-06-09 MED ORDER — SODIUM CHLORIDE 0.9 % IV SOLN: 250.0000 mL | INTRAVENOUS | Status: DC | PRN

## 2021-06-09 MED ORDER — CHLORHEXIDINE GLUCONATE CLOTH 2 % EX PADS
6.0000 | MEDICATED_PAD | Freq: Every day | CUTANEOUS | Status: DC
  Administered 2021-06-09 – 2021-06-11 (×3): 6 via TOPICAL

## 2021-06-09 MED ORDER — SODIUM CHLORIDE 0.9 % IV SOLN: INTRAVENOUS | Status: DC

## 2021-06-09 MED ORDER — SODIUM CHLORIDE 0.9 % IV BOLUS
1000.0000 mL | Freq: Once | INTRAVENOUS | Status: AC
  Administered 2021-06-09: 1000 mL via INTRAVENOUS

## 2021-06-09 MED ORDER — LABETALOL HCL 5 MG/ML IV SOLN: 10.0000 mg | INTRAVENOUS | Status: AC | PRN

## 2021-06-09 MED ORDER — HEPARIN (PORCINE) IN NACL 1000-0.9 UT/500ML-% IV SOLN
INTRAVENOUS | Status: DC | PRN
  Administered 2021-06-09 (×3): 500 mL

## 2021-06-09 MED ORDER — FENTANYL CITRATE (PF) 100 MCG/2ML IJ SOLN
INTRAMUSCULAR | Status: AC
Start: 2021-06-09 — End: ?
  Filled 2021-06-09: qty 2

## 2021-06-09 MED ORDER — HEPARIN SODIUM (PORCINE) 1000 UNIT/ML IJ SOLN
INTRAMUSCULAR | Status: DC | PRN
  Administered 2021-06-09 (×2): 5000 [IU] via INTRAVENOUS

## 2021-06-09 MED ORDER — NOREPINEPHRINE 4 MG/250ML-% IV SOLN: 0.0000 ug/min | INTRAVENOUS | Status: DC

## 2021-06-09 MED ORDER — FENTANYL CITRATE (PF) 100 MCG/2ML IJ SOLN
INTRAMUSCULAR | Status: DC | PRN
  Administered 2021-06-09: 12.5 ug via INTRAVENOUS

## 2021-06-09 MED ORDER — ONDANSETRON HCL 4 MG/2ML IJ SOLN: 4.0000 mg | Freq: Four times a day (QID) | INTRAMUSCULAR | Status: DC | PRN

## 2021-06-09 MED ORDER — HEPARIN SODIUM (PORCINE) 1000 UNIT/ML IJ SOLN
INTRAMUSCULAR | Status: AC
  Filled 2021-06-09: qty 10

## 2021-06-09 MED ORDER — NOREPINEPHRINE 4 MG/250ML-% IV SOLN
2.0000 ug/min | INTRAVENOUS | Status: DC
  Filled 2021-06-09: qty 250

## 2021-06-09 MED ORDER — POTASSIUM CHLORIDE 10 MEQ/100ML IV SOLN
10.0000 meq | INTRAVENOUS | Status: AC
  Administered 2021-06-09 – 2021-06-10 (×2): 10 meq via INTRAVENOUS
  Filled 2021-06-09 (×3): qty 100

## 2021-06-09 MED ORDER — CLOPIDOGREL BISULFATE 75 MG PO TABS
ORAL_TABLET | ORAL | Status: AC
  Filled 2021-06-09: qty 8

## 2021-06-09 MED ORDER — CLOPIDOGREL BISULFATE 75 MG PO TABS
75.0000 mg | ORAL_TABLET | Freq: Every day | ORAL | Status: DC
  Administered 2021-06-10 – 2021-06-11 (×2): 75 mg via ORAL
  Filled 2021-06-09 (×2): qty 1

## 2021-06-09 MED ORDER — HYDRALAZINE HCL 20 MG/ML IJ SOLN: 10.0000 mg | INTRAMUSCULAR | Status: AC | PRN

## 2021-06-09 SURGICAL SUPPLY — 20 items
BALLN TREK RX 2.5X15 (BALLOONS) ×2
BALLN ~~LOC~~ EUPHORA RX 3.0X15 (BALLOONS) ×2
BALLOON TREK RX 2.5X15 (BALLOONS) IMPLANT
BALLOON ~~LOC~~ EUPHORA RX 3.0X15 (BALLOONS) IMPLANT
CATH INFINITI 5FR ANG PIGTAIL (CATHETERS) ×1 IMPLANT
CATH INFINITI JR4 5F (CATHETERS) ×1 IMPLANT
CATH LAUNCHER 6FR JL3.5 (CATHETERS) ×1 IMPLANT
DEVICE RAD TR BAND REGULAR (VASCULAR PRODUCTS) ×1 IMPLANT
DRAPE BRACHIAL (DRAPES) ×1 IMPLANT
GLIDESHEATH SLEND SS 6F .021 (SHEATH) ×1 IMPLANT
GUIDEWIRE INQWIRE 1.5J.035X260 (WIRE) IMPLANT
INQWIRE 1.5J .035X260CM (WIRE) ×2
KIT ENCORE 26 ADVANTAGE (KITS) ×1 IMPLANT
PACK CARDIAC CATH (CUSTOM PROCEDURE TRAY) ×2 IMPLANT
PROTECTION STATION PRESSURIZED (MISCELLANEOUS) ×2
SET ATX SIMPLICITY (MISCELLANEOUS) ×1 IMPLANT
STATION PROTECTION PRESSURIZED (MISCELLANEOUS) IMPLANT
STENT ONYX FRONTIER 2.75X22 (Permanent Stent) ×1 IMPLANT
TUBING CIL FLEX 10 FLL-RA (TUBING) ×1 IMPLANT
WIRE ASAHI PROWATER 180CM (WIRE) ×1 IMPLANT

## 2021-06-09 NOTE — Progress Notes (Signed)
Consult generated for USGPIV for pressor. Bedside RN confirms that pressor on hold at this time. ?

## 2021-06-09 NOTE — ED Notes (Signed)
Cards @ the bedside.  ?

## 2021-06-09 NOTE — ED Provider Notes (Signed)
? ?[facility] ?Provider Note ? ? ? Event Date/Time  ? First MD Initiated Contact with Patient 06/09/21 2011   ?  (approximate) ? ? ?History  ? ?Code STEMI ? ? ?HPI ? ?Hunter Bailey is a 87 y.o. male coming from the [place].  Per EMS and nursing staff the aerobics who I talked to afterwards patient had gone to the bathroom for the first time in several days.  He apparently passed out while they were pulling up his pants.  As I understand it he then complained after he woke up that he had not finished and he went back on the toilet and passed out again.  EMS arrived and did an EKG which showed ST elevation in V2 through 6.  EMS then called on said they were activating a STEMI alert.  Patient arrives in the emergency department and blood pressure in the 70s patient is very groggy initially could not get a history out of him.  Review of old records showed he had had a CVA with dysphagia but we were unsure of the date.  Patient later woke up more when his blood pressure got higher after some fluids and was able to tell us that his CVA was in October and he had a stent several years ago at [facility].  There were no old EKGs available in the chart except for one from 2006. Patient currently denying any pain.  He reports he did have pain in his shoulder earlier.  He had no shortness of breath sweating or nausea. ? ?  ? ? ?Physical Exam  ? ?Triage Vital Signs: ?ED Triage Vitals  ?Enc Vitals Group  ?   BP 06/09/21 1954 120/64  ?   Pulse Rate 06/09/21 1954 82  ?   Resp 06/09/21 1954 20  ?   Temp 06/09/21 2005 (!) 97.3 ?F (36.3 ?C)  ?   Temp Source 06/09/21 1954 Oral  ?   SpO2 06/09/21 1954 98 %  ?   Weight 06/09/21 1955 145 lb 15.1 oz (66.2 kg)  ?   Height 06/09/21 1955 '5\' 8"'$  (1.727 m)  ?   Head Circumference --   ?   Peak Flow --   ?   Pain Score --   ?   Pain Loc --   ?   Pain Edu? --   ?   Excl. in [place]? --   ? ? ?Most recent vital signs: ?Vitals:  ? 06/09/21 2009 06/09/21 2015  ?BP: (!) 98/51 (!) 119/58   ?Pulse: 67 70  ?Resp: 13 16  ?Temp:    ?SpO2: 99% 98%  ? ? ?General: Awake, no distress.  ?CV:  Good peripheral perfusion.  Heart regular rate and rhythm there is a systolic murmur about 2/6. ?Resp:  Normal effort.  Lungs are clear ?Abd:  No distention.  Abdomen soft bowel sounds positive there is no tenderness ?Extremities with no edema ? ? ?ED Results / Procedures / Treatments  ? ?Labs ?(all labs ordered are listed, but only abnormal results are displayed) ?Labs Reviewed  ?CBC WITH DIFFERENTIAL/PLATELET - Abnormal; Notable for the following components:  ?    Result Value  ? RBC 3.94 (*)   ? Hemoglobin 12.1 (*)   ? HCT 36.7 (*)   ? All other components within normal limits  ?COMPREHENSIVE METABOLIC PANEL - Abnormal; Notable for the following components:  ? Potassium 3.2 (*)   ? Glucose, Bld 200 (*)   ? Creatinine, Ser 1.35 (*)   ?  GFR, Estimated 52 (*)   ? All other components within normal limits  ?LIPID PANEL - Abnormal; Notable for the following components:  ? HDL 31 (*)   ? All other components within normal limits  ?TROPONIN I (HIGH SENSITIVITY) - Abnormal; Notable for the following components:  ? Troponin I (High Sensitivity) 23 (*)   ? All other components within normal limits  ?HEMOGLOBIN A1C  ?PROTIME-INR  ?APTT  ? ? ? ?EKG ? ?EKG done here #1 shows normal sinus rhythm rate of 77 with 1 PVC extreme right axis approximately 1 mm ST elevation in 2 3 and F 2 mm of elevation in V3 and four 1 mm in V5 and 6.  This is an improvement from the EKG that EMS provided [place]. ?EKG #2 shows normal sinus rhythm rate of 77 extreme right axis I millimeter ST elevation in 2 3 and F and some more elevation in V4 and V5 only. ?EKG #3 normal sinus rhythm rate of 68 normal axis no shows ST elevation 1 mm in F.  There is also elevation in V4 and 5.  T waves are inverted now. ?Chest x-ray read by radiology reviewed by me initially before the radiologist read it shows no active disease ? ? ?PROCEDURES: ? ?Critical Care performed:  Critical care time approximately 20 minutes. ? ?Procedures ? ? ?MEDICATIONS ORDERED IN ED: ?Medications  ?0.9 %  sodium chloride infusion (has no administration in time range)  ?aspirin chewable tablet 324 mg ( Oral MAR Hold 06/09/21 2030)  ?0.9 %  sodium chloride infusion (has no administration in time range)  ?norepinephrine (LEVOPHED) '4mg'$  in 262m (0.016 mg/mL) premix infusion (has no administration in time range)  ?heparin injection 3,950 Units (3,950 Units Intravenous Given 06/09/21 1957)  ?sodium chloride 0.9 % bolus 500 mL (500 mLs Intravenous New Bag/Given 06/09/21 2002)  ?sodium chloride 0.9 % bolus 1,000 mL (1,000 mLs Intravenous New Bag/Given 06/09/21 2009)  ? ? ? ?IMPRESSION / MDM / ASSESSMENT AND PLAN / ED COURSE  ?I reviewed the triage vital signs and the nursing notes. ?I called Dr. PSaralyn Pilarafter he got the first EKG and was giving him the initial history before I was able to [facility].  We had given the patient aspirin suppository and heparin bolus.  He was on oxygen because his sats were low and we were giving him fluids because of his low blood pressure.  Dr. PSaralyn Pilarthen [email or web address] ?.Hunter Bailey Patient's blood pressure had normalized after having received about 400 cc of fluid.  He was awake and speaking much more clearly and making more sense with his higher blood pressure of 107.  Blood pressure ended up going up to the 100 and teens. ? ?We reviewed the old records that were available in the computer but could not find another old EKG except for one done in 2006. ? ?The patient is on the cardiac monitor to evaluate for evidence of arrhythmia and/or significant heart rate changes.  Besides a few PVCs no arrhythmias were seen ?Patient reports his chest pain is now gone.  He still having ST elevation.  Dr. PSaralyn Pilarwill take him to the Cath Lab. ?  ? ? ?FINAL CLINICAL IMPRESSION(S) / ED DIAGNOSES  ? ?Final diagnoses:  ?ST elevation myocardial infarction (STEMI), unspecified  artery ([place]  ? ? ? ?Rx / DC Orders  ? ?ED Discharge Orders   ? ? None  ? ?  ? ? ? ?Note:  This document was prepared using Dragon voice recognition software and may include unintentional dictation errors. ?  ?Nena Polio, MD ?06/09/21 2037 ? ?

## 2021-06-09 NOTE — Consult Note (Signed)
Initial Consultation Note ? ? ?Patient: Hunter Bailey ZDG:[id number] DOB: [birth year] PCP: No primary care provider on file. ?DOA: 06/09/2021 ?DOS: the patient was seen and examined on 06/09/2021 ?Primary service: Isaias Cowman, MD ? ?Referring physician: Dr.Paraschos. ?Reason for consult: Medical management.  ? ?Assessment/Plan: ?>>>STEMI: ?Appreciate consult opportunity. ?Cont antiplatelet and high intensity statin ?Per cardiology recommendation patient will go to the ICU continue on high intensity statin therapy to antiplatelet therapy.  Uninterrupted antiplatelet therapy for 1 year, metoprolol 25, 2D echocardiogram. ? ?>>> Hypokalemia: ?Replace and follow levels. ? ?>>> Acute kidney injury: ?Lab Results  ?Component Value Date  ? CREATININE 1.35 (H) 06/09/2021  ?With no prior to compare. ?We will start patient on low rate IV fluid for the next 8 hours.  To prevent additional CIN from cardiac cath. ? ?>>>Diabetes mellitus type 2: ?We will start patient on sliding scale insulin and changed ? ?>>> Anemia:  ? ?  Latest Ref Rng & Units 06/09/2021  ?  7:56 PM  ?CBC  ?WBC 4.0 - 10.5 K/uL 6.9    ?Hemoglobin 13.0 - 17.0 g/dL 12.1    ?Hematocrit 39.0 - 52.0 % 36.7    ?Platelets 150 - 400 K/uL 188    ?Indices normal. ?Supportive care and indices are normal. ? ?HPI: Hunter Bailey is a 87 y.o. male with past medical history of Htn,CVA with LUE paresis and contracture presenting to [place] with syncope episode. Pt is at [facility] rehab form his recent stroke and was on commode when he had a syncopal episode while pulling his paints up.[name] was called and ekg revealed st elevation in ii,iii avl and v2-v6 .  Patient on initial presentation had chest discomfort which is now resolved.  Patient was taken urgently to Cath Lab for code STEMI.  Cardiac cath showed Result Date: 06/09/2021 ?  1st Mrg lesion is 40% stenosed.   Mid LAD lesion is 95% stenosed.   Prox RCA lesion is 30% stenosed.   A drug-eluting stent was successfully placed  using a STENT ONYX FRONTIER 2.75X22.   Post intervention, there is a 0% residual stenosis.   There is moderate left ventricular systolic dysfunction.   The left ventricular ejection fraction is 25-35% by visual estimate. 1.  Anterior STEMI 2.  One-vessel coronary artery disease with high-grade 95% ulcerated in-stent stenosis mid LAD 3.  Moderate to severely reduced left ventricular function anterior akinesis and apical dyskinesis 4.  Successful PCI with DES mid LAD Recommendations 1.  Dual antiplatelet therapy uninterrupted for 1 year 2.  Start metoprolol succinate 25 mg daily 3.  Start atorvastatin 80 mg daily 4.  2D echocardiogram  ? ?Review of Systems: Review of Systems  ?Unable to perform ROS: Age  ?Cardiovascular:  Positive for chest pain.  ? ?History reviewed. No pertinent past medical history. ?History reviewed. No pertinent surgical history. ? ?Social History:  has no history on file for tobacco use, alcohol use, and drug use. ? ?Prior to Admission medications   ?Not on File  ? ? ?Physical Exam: ?Vitals:  ? 06/09/21 2118 06/09/21 2123 06/09/21 2128 06/09/21 2150  ?BP: 129/77 112/69 112/69 131/75  ?Pulse: 79 69 70 84  ?Resp: 15 (!) 21 (!) 21 20  ?Temp:    98.5 ?F (36.9 ?C)  ?TempSrc:    Oral  ?SpO2: 100% 99% 100% 99%  ?Weight:    67.5 kg  ?Height:    '5\' 8"'$  (1.727 m)  ?Physical Exam ?Vitals and nursing note reviewed.  ?Constitutional:   ?  General: He is not in acute distress. ?   Appearance: Normal appearance. He is not ill-appearing, toxic-appearing or diaphoretic.  ?HENT:  ?   Head: Normocephalic and atraumatic.  ?   Right Ear: Hearing and external ear normal.  ?   Left Ear: Hearing and external ear normal.  ?   Nose: Nose normal. No nasal deformity.  ?   Mouth/Throat:  ?   Lips: Pink.  ?   Mouth: Mucous membranes are moist.  ?   Tongue: No lesions.  ?   Pharynx: Oropharynx is clear.  ?Eyes:  ?   Extraocular Movements: Extraocular movements intact.  ?   Pupils: Pupils are equal, round, and reactive to  light.  ?Neck:  ?   Vascular: No carotid bruit.  ?Cardiovascular:  ?   Rate and Rhythm: Normal rate and regular rhythm.  ?   Pulses: Normal pulses.  ?   Heart sounds: Normal heart sounds.  ?Pulmonary:  ?   Effort: Pulmonary effort is normal.  ?   Breath sounds: Normal breath sounds.  ?Abdominal:  ?   General: Bowel sounds are normal. There is no distension.  ?   Palpations: Abdomen is soft. There is no mass.  ?   Tenderness: There is no abdominal tenderness. There is no guarding.  ?   Hernia: No hernia is present.  ?Musculoskeletal:  ?   Right lower leg: No edema.  ?   Left lower leg: No edema.  ?   Comments: LUE flexed , contracted and weak.   ?Skin: ?   General: Skin is warm.  ?Neurological:  ?   Mental Status: He is alert and oriented to person, place, and time.  ?   Cranial Nerves: Cranial nerves 2-12 are intact.  ?   Motor: Weakness present.  ?Psychiatric:     ?   Attention and Perception: Attention normal.     ?   Mood and Affect: Mood normal.     ?   Speech: Speech normal.     ?   Behavior: Behavior normal. Behavior is cooperative.     ?   Cognition and Memory: Cognition normal.  ? ? ?Data Reviewed:  ? ?Results for orders placed or performed during the hospital encounter of 06/09/21 (from the past 24 hour(s))  ?CBC with Differential/Platelet     Status: Abnormal  ? Collection Time: 06/09/21  7:56 PM  ?Result Value Ref Range  ? WBC 6.9 4.0 - 10.5 K/uL  ? RBC 3.94 (L) 4.22 - 5.81 MIL/uL  ? Hemoglobin 12.1 (L) 13.0 - 17.0 g/dL  ? HCT 36.7 (L) 39.0 - 52.0 %  ? MCV 93.1 80.0 - 100.0 fL  ? MCH 30.7 26.0 - 34.0 pg  ? MCHC 33.0 30.0 - 36.0 g/dL  ? RDW 13.2 11.5 - 15.5 %  ? Platelets 188 150 - 400 K/uL  ? nRBC 0.0 0.0 - 0.2 %  ? Neutrophils Relative % 34 %  ? Neutro Abs 2.4 1.7 - 7.7 K/uL  ? Lymphocytes Relative 57 %  ? Lymphs Abs 3.8 0.7 - 4.0 K/uL  ? Monocytes Relative 7 %  ? Monocytes Absolute 0.5 0.1 - 1.0 K/uL  ? Eosinophils Relative 2 %  ? Eosinophils Absolute 0.2 0.0 - 0.5 K/uL  ? Basophils Relative 0 %  ?  Basophils Absolute 0.0 0.0 - 0.1 K/uL  ? WBC Morphology MORPHOLOGY UNREMARKABLE   ? Smear Review MORPHOLOGY UNREMARKABLE   ? Immature Granulocytes 0 %  ? Abs  Immature Granulocytes 0.02 0.00 - 0.07 K/uL  ? Burr Cells PRESENT   ?Protime-INR     Status: None  ? Collection Time: 06/09/21  7:56 PM  ?Result Value Ref Range  ? Prothrombin Time 14.7 11.4 - 15.2 seconds  ? INR 1.2 0.8 - 1.2  ?APTT     Status: None  ? Collection Time: 06/09/21  7:56 PM  ?Result Value Ref Range  ? aPTT 31 24 - 36 seconds  ?Comprehensive metabolic panel     Status: Abnormal  ? Collection Time: 06/09/21  7:56 PM  ?Result Value Ref Range  ? Sodium 137 135 - 145 mmol/L  ? Potassium 3.2 (L) 3.5 - 5.1 mmol/L  ? Chloride 102 98 - 111 mmol/L  ? CO2 25 22 - 32 mmol/L  ? Glucose, Bld 200 (H) 70 - 99 mg/dL  ? BUN 16 8 - 23 mg/dL  ? Creatinine, Ser 1.35 (H) 0.61 - 1.24 mg/dL  ? Calcium 9.4 8.9 - 10.3 mg/dL  ? Total Protein 7.3 6.5 - 8.1 g/dL  ? Albumin 4.0 3.5 - 5.0 g/dL  ? AST 23 15 - 41 U/L  ? ALT 20 0 - 44 U/L  ? Alkaline Phosphatase 44 38 - 126 U/L  ? Total Bilirubin 1.0 0.3 - 1.2 mg/dL  ? GFR, Estimated 52 (L) >60 mL/min  ? Anion gap 10 5 - 15  ?Troponin I (High Sensitivity)     Status: Abnormal  ? Collection Time: 06/09/21  7:56 PM  ?Result Value Ref Range  ? Troponin I (High Sensitivity) 23 (H) <18 ng/L  ?Lipid panel     Status: Abnormal  ? Collection Time: 06/09/21  7:56 PM  ?Result Value Ref Range  ? Cholesterol 96 0 - 200 mg/dL  ? Triglycerides 72 <150 mg/dL  ? HDL 31 (L) >40 mg/dL  ? Total CHOL/HDL Ratio 3.1 RATIO  ? VLDL 14 0 - 40 mg/dL  ? LDL Cholesterol 51 0 - 99 mg/dL  ?POCT Activated clotting time     Status: None  ? Collection Time: 06/09/21  8:56 PM  ?Result Value Ref Range  ? Activated Clotting Time 546 seconds  ?POCT Activated clotting time     Status: None  ? Collection Time: 06/09/21  9:10 PM  ?Result Value Ref Range  ? Activated Clotting Time 233 seconds  ?POCT Activated clotting time     Status: None  ? Collection Time: 06/09/21   9:23 PM  ?Result Value Ref Range  ? Activated Clotting Time 311 seconds  ?Troponin I (High Sensitivity)     Status: Abnormal  ? Collection Time: 06/09/21 10:02 PM  ?Result Value Ref Range  ? Troponin I (High S

## 2021-06-09 NOTE — Consult Note (Signed)
[facility] Cardiology ? ?CARDIOLOGY CONSULT NOTE  ?Patient ID: ?Hunter Bailey ?MRN: [id number] ?DOB/AGE: 85-Apr-1936 85 y.o. ? ?Admit date: 06/09/2021 ?Referring Physician Malinda ?Primary Physician [facility] ?Primary Cardiologist [place] ?Reason for Consultation anterior STEMI ? ?HPI: 87 year old gentleman referred for evaluation of anterior STEMI.  The patient has a history of prior myocardial infarction and bare-metal stent.  He is status post prior CVA with residual left hemiparesis.  He is a resident at the [place] old nursing facility.  Earlier this evening, the patient went to the bathroom, passed out while pulling up his pants.  The patient later complained he needed to turn to the toilet, passed out again.  EMS was called and upon arrival ECG revealed ST elevations in the anterior lateral leads.  He was brought to [facility] ED time he was hypotensive and not very conversant.  Patient was treated with intravenous fluids, blood pressure improved, the patient was able to answer questions.  The patient denies chest pain.  Repeat ECG showed distant ST elevation in leads V4, 5 and 6. ? ?Review of systems complete and found to be negative unless listed above  ? ? ? ? ?No medications prior to admission.  ? ?Social History  ? ?Socioeconomic History  ? Marital status: Divorced  ?  Spouse name: Not on file  ? Number of children: Not on file  ? Years of education: Not on file  ? Highest education level: Not on file  ?Occupational History  ? Not on file  ?Tobacco Use  ? Smoking status: Not on file  ? Smokeless tobacco: Not on file  ?Substance and Sexual Activity  ? Alcohol use: Not on file  ? Drug use: Not on file  ? Sexual activity: Not on file  ?Other Topics Concern  ? Not on file  ?Social History Narrative  ? Not on file  ? ?Social Determinants of Health  ? ?Financial Resource Strain: Not on file  ?Food Insecurity: Not on file  ?Transportation Needs: Not on file  ?Physical Activity: Not on file  ?Stress: Not on file  ?Social  Connections: Not on file  ?Intimate Partner Violence: Not on file  ?  ?No family history on file.  ? ? ?Review of systems complete and found to be negative unless listed above  ? ? ? ? ?PHYSICAL EXAM ? ?General: Well developed, well nourished, in no acute distress ?HEENT:  Normocephalic and atramatic ?Neck:  No JVD.  ?Lungs: Clear bilaterally to auscultation and percussion. ?Heart: HRRR . Normal S1 and S2 without gallops or murmurs.  ?Abdomen: Bowel sounds are positive, abdomen soft and non-tender  ?Msk:  Back normal, normal gait. Normal strength and tone for age. ?Extremities: No clubbing, cyanosis or edema.   ?Neuro: Alert and oriented X 3. ?Psych:  Good affect, responds appropriately ? ?Labs: ?  ?Lab Results  ?Component Value Date  ? WBC 6.9 06/09/2021  ? HGB 12.1 (L) 06/09/2021  ? HCT 36.7 (L) 06/09/2021  ? MCV 93.1 06/09/2021  ? PLT 188 06/09/2021  ?  ?Recent Labs  ?Lab 06/09/21 ?1956  ?NA 137  ?K 3.2*  ?CL 102  ?CO2 25  ?BUN 16  ?CREATININE 1.35*  ?CALCIUM 9.4  ?PROT 7.3  ?BILITOT 1.0  ?ALKPHOS 44  ?ALT 20  ?AST 23  ?GLUCOSE 200*  ? ?No results found for: CKTOTAL, CKMB, CKMBINDEX, TROPONINI  ?Lab Results  ?Component Value Date  ? CHOL 96 06/09/2021  ? ?Lab Results  ?Component Value Date  ? HDL 31 (L)  06/09/2021  ? ?Lab Results  ?Component Value Date  ? LDLCALC 51 06/09/2021  ? ?Lab Results  ?Component Value Date  ? TRIG 72 06/09/2021  ? ?Lab Results  ?Component Value Date  ? CHOLHDL 3.1 06/09/2021  ? ?No results found for: LDLDIRECT  ?  ?Radiology: CARDIAC CATHETERIZATION ? ?Result Date: 06/09/2021 ?  1st Mrg lesion is 40% stenosed.   Mid LAD lesion is 95% stenosed.   Prox RCA lesion is 30% stenosed.   A drug-eluting stent was successfully placed using a STENT ONYX FRONTIER 2.75X22.   Post intervention, there is a 0% residual stenosis.   There is moderate left ventricular systolic dysfunction.   The left ventricular ejection fraction is 25-35% by visual estimate. 1.  Anterior STEMI 2.  One-vessel coronary  artery disease with high-grade 95% ulcerated in-stent stenosis mid LAD 3.  Moderate to severely reduced left ventricular function anterior akinesis and apical dyskinesis 4.  Successful PCI with DES mid LAD Recommendations 1.  Dual antiplatelet therapy uninterrupted for 1 year 2.  Start metoprolol succinate 25 mg daily 3.  Start atorvastatin 80 mg daily 4.  2D echocardiogram  ? ?DG Chest Port 1 View ? ?Result Date: 06/09/2021 ?CLINICAL DATA:  STEMI. EXAM: PORTABLE CHEST 1 VIEW COMPARISON:  Chest radiograph dated 06/24/2004. FINDINGS: The lungs are clear. There is no pleural effusion pneumothorax. The cardiac silhouette is within normal limits. No acute osseous pathology. Degenerative changes of the shoulder. IMPRESSION: No active disease. Electronically Signed   By: Anner Crete M.D.   On: 06/09/2021 20:15   ? ?EKG: Sinus rhythm with ST elevation in leads V3 4 5 and 6 ? ?ASSESSMENT AND PLAN:  ? ?Anterior ST elevation myocardial infarction, with atypical presentation, following syncope x2, without chest pain, with ECG revealing elevation in leads V3,4,5 and 6.  Emergent cardiac catheterization revealed ulcerated 99% in-stent restenosis mid LAD.  Left ventriculography revealed dilated cardiomyopathy, apical dyskinesis and anterior wall akinesis with estimated LV ejection fraction 25 to 35%.  The patient underwent successful PCI with DES mid LAD. ?Coronary artery disease, history of prior MI and BMS stent ?Status post CVA with residual left hemiparesis ? ?Recommendations ? ?1.  Dual antiplatelet therapy uninterrupted for 1 year ?2.  Start metoprolol succinate 25 mg daily ?3.  Start atorvastatin 80 mg daily ?4.  2D echocardiogram ? ?Signed: ?Isaias Cowman MD,PhD, FACC ?06/09/2021, 9:39 PM ? ? ? ? ?  ?

## 2021-06-09 NOTE — ED Notes (Signed)
4000units of heparin IV administered by Liane Comber, RN '@1957'$  ? ?

## 2021-06-09 NOTE — Progress Notes (Signed)
?   06/09/21 2300  ?Clinical Encounter Type  ?Visited With Patient  ?Visit Type Initial  ?Referral From Nurse  ?Consult/Referral To Chaplain  ? ?Chaplain responded to code stemi. Patient in cath lab upon [facility] arrival. Chaplain will follow up. ?

## 2021-06-09 NOTE — Progress Notes (Signed)
Chaplain responded to ED nurse asking for Chaplain for niece while patient was in cath lab. Chaplain responded to spoke with niece in the hallway waiting area. Chaplain provided compassionate presence and reflective listening for family member. ?

## 2021-06-09 NOTE — ED Triage Notes (Signed)
Pt presents via EMS from the [place] ([facility]) after being unresponsive with staff. Code STEMI called in the field. Pt lethargic upon arrival - unsure of the patients baseline. ?

## 2021-06-09 NOTE — ED Notes (Signed)
Per MD Malinda. Repeat EKG. EKG repeated and signed by MD Malinda. Cardiologist at bedside at this time.  ?

## 2021-06-09 NOTE — ED Notes (Addendum)
Pt transported to cath lab by primary RN Lilia Pro) & EDT Linus Orn). ?

## 2021-06-09 NOTE — ED Notes (Signed)
$'300mg'I$  ASA suppository administered by Lilia Pro, RN ?

## 2021-06-10 ENCOUNTER — Encounter: Payer: Self-pay | Admitting: Cardiology

## 2021-06-10 ENCOUNTER — Inpatient Hospital Stay
Admit: 2021-06-10 | Discharge: 2021-06-10 | Disposition: A | Discharge status: Home / Self Care | Payer: Medicare Other | Attending: Cardiology | Admitting: Cardiology

## 2021-06-10 LAB — CBC
HCT: 33.8 % — ABNORMAL LOW (ref 39.0–52.0)
Hemoglobin: 11.1 g/dL — ABNORMAL LOW (ref 13.0–17.0)
MCH: 30.6 pg (ref 26.0–34.0)
MCHC: 32.8 g/dL (ref 30.0–36.0)
MCV: 93.1 fL (ref 80.0–100.0)
Platelets: 157 10*3/uL (ref 150–400)
RBC: 3.63 MIL/uL — ABNORMAL LOW (ref 4.22–5.81)
RDW: 13.5 % (ref 11.5–15.5)
WBC: 4.1 10*3/uL (ref 4.0–10.5)
nRBC: 0 % (ref 0.0–0.2)

## 2021-06-10 LAB — BASIC METABOLIC PANEL
Anion gap: 6 (ref 5–15)
BUN: 14 mg/dL (ref 8–23)
CO2: 28 mmol/L (ref 22–32)
Calcium: 8.8 mg/dL — ABNORMAL LOW (ref 8.9–10.3)
Chloride: 103 mmol/L (ref 98–111)
Creatinine, Ser: 0.99 mg/dL (ref 0.61–1.24)
GFR, Estimated: >60 mL/min (ref >60)
Glucose, Bld: 108 mg/dL — ABNORMAL HIGH (ref 70–99)
Potassium: 3.5 mmol/L (ref 3.5–5.1)
Sodium: 137 mmol/L (ref 135–145)

## 2021-06-10 LAB — ECHOCARDIOGRAM COMPLETE
AR max vel: 3.58 cm2
AV Area VTI: 3.38 cm2
AV Area mean vel: 3.34 cm2
AV Mean grad: 4 mmHg
AV Peak grad: 6.7 mmHg
Ao pk vel: 1.29 m/s
Area-P 1/2: 4.19 cm2
Height: 68 in
MV VTI: 3.81 cm2
S' Lateral: 2.89 cm
Weight: 2380.97 oz

## 2021-06-10 LAB — HEMOGLOBIN A1C
Hgb A1c MFr Bld: 6.4 % — ABNORMAL HIGH (ref 4.8–5.6)
Mean Plasma Glucose: 136.98 mg/dL

## 2021-06-10 LAB — GLUCOSE, CAPILLARY: Glucose-Capillary: 136 mg/dL — ABNORMAL HIGH (ref 70–99)

## 2021-06-10 MED ORDER — LISINOPRIL 5 MG PO TABS
2.5000 mg | ORAL_TABLET | Freq: Every day | ORAL | Status: DC
  Administered 2021-06-11: 2.5 mg via ORAL
  Filled 2021-06-10: qty 1

## 2021-06-10 MED ORDER — POLYETHYLENE GLYCOL 3350 17 G PO PACK
34.0000 g | PACK | ORAL | Status: AC
  Administered 2021-06-10 (×3): 34 g via ORAL
  Filled 2021-06-10 (×4): qty 2

## 2021-06-10 NOTE — Progress Notes (Addendum)
0400: Patient has not voided since transfer to the ICU. Bladder scan reveals 368 mL in the bladder. Patient states that he does not feel the need to urinate at the moment. Will reassess/bladder scan around 0600 in order to determine a need for intervention. Patient is resting comfortably in bed. Will continue to monitor. ? ?Addendum 0600: Patient voided 600 mL via external urinary catheter. ? ?Cameron Ali, RN ?

## 2021-06-10 NOTE — Progress Notes (Signed)
*  PRELIMINARY RESULTS* ?Echocardiogram ?2D Echocardiogram has been performed. ? ?Hunter Bailey Hunter Bailey ?06/10/2021, 10:59 AM ?

## 2021-06-10 NOTE — Progress Notes (Signed)
?Consult PROGRESS NOTE ? ? ? ?Hunter Bailey  DQQ:[id number] DOB: Jan 31, 1935 DOA: 06/09/2021 ?PCP: No primary care provider on file.  ?IC18A/IC18A-AA ? ? ?Assessment & Plan: ?  ?Principal Problem: ?  Acute ST elevation myocardial infarction (STEMI) involving left anterior descending (LAD) coronary artery ([place]) ? ? ? ?Hunter Bailey is a 87 y.o.  male with medical history significant for prior myocardial infarction and bare-metal stent, CVA with residual left hemiparesis, who presented from [facility] with syncope x2, and found to have STEMI.     ?  ?Anterior ST elevation myocardial infarction ?--with atypical presentation, following syncope x2, without chest pain, with ECG revealing elevation in leads V3,4,5 and 6.  Emergent cardiac catheterization revealed ulcerated 99% in-stent restenosis mid LAD.   ?--successful PCI with DES mid LAD. ?Plan: ?--ASA 81 and plavix for 1 year ?--cont lipitor 80 mg daily ? ?Acute combined CHF ?--Post-revascularization Echocardiogram complete resulted with mildly reduced EF 40-45% with G1 DD and without valvular insufficiencies.   ?Plan: ?--cont metoprolol succinate 25 mg daily ?--start Lisinopril 2.5 mg daily  ? ?Hypokalemia ?--Monitor and replete PRN ?  ?Acute kidney injury ?--Cr 1.35 on presentation.  Improved to 0.99 next day.   ? ?Prediabetes ?--A1c 6.4, no need for BG checks and SSI ? ? ?Subjective and Interval History:  ?Pt denied chest pain, dyspnea.  Complained of constipation. ? ? ?Objective: ?Vitals:  ? 06/10/21 1200 06/10/21 1300 06/10/21 1400 06/10/21 1500  ?BP: (!) 145/98 (!) 151/76 135/73 120/73  ?Pulse: 61 70 66 (!) 57  ?Resp: '15 18 20 17  '$ ?Temp:   98.5 ?F (36.9 ?C)   ?TempSrc:      ?SpO2: 100% 100% 100% 94%  ?Weight:      ?Height:      ? ? ?Intake/Output Summary (Last 24 hours) at 06/10/2021 1659 ?Last data filed at 06/10/2021 0600 ?Gross per 24 hour  ?Intake 575.28 ml  ?Output 600 ml  ?Net -24.72 ml  ? ?Filed Weights  ? 06/09/21 1955 06/09/21 2150   ?Weight: 66.2 kg 67.5 kg  ? ? ?Examination:  ? ?Constitutional: NAD, AAOx3 ?HEENT: conjunctivae and lids normal, EOMI ?CV: No cyanosis.   ?RESP: normal respiratory effort, on RA ?Extremities: No effusions, edema in BLE ?SKIN: warm, dry ?Neuro: II - XII grossly intact.   ?Psych: Normal mood and affect.  Appropriate judgement and reason ? ? ?Data Reviewed: I have personally reviewed following labs and imaging studies ? ?CBC: ?Recent Labs  ?Lab 06/09/21 ?1956 06/10/21 ?0636  ?WBC 6.9 4.1  ?NEUTROABS 2.4  --   ?HGB 12.1* 11.1*  ?HCT 36.7* 33.8*  ?MCV 93.1 93.1  ?PLT 188 157  ? ?Basic Metabolic Panel: ?Recent Labs  ?Lab 06/09/21 ?1956 06/10/21 ?0636  ?NA 137 137  ?K 3.2* 3.5  ?CL 102 103  ?CO2 25 28  ?GLUCOSE 200* 108*  ?BUN 16 14  ?CREATININE 1.35* 0.99  ?CALCIUM 9.4 8.8*  ? ?GFR: ?Estimated Creatinine Clearance: 53 mL/min (by C-G formula based on SCr of 0.99 mg/dL). ?Liver Function Tests: ?Recent Labs  ?Lab 06/09/21 ?1956  ?AST 23  ?ALT 20  ?ALKPHOS 44  ?BILITOT 1.0  ?PROT 7.3  ?ALBUMIN 4.0  ? ?No results for input(s): LIPASE, AMYLASE in the last 168 hours. ?No results for input(s): AMMONIA in the last 168 hours. ?Coagulation Profile: ?Recent Labs  ?Lab 06/09/21 ?1956  ?INR 1.2  ? ?Cardiac Enzymes: ?No results for input(s): CKTOTAL, CKMB, CKMBINDEX, TROPONINI in the last 168 hours. ?BNP (  last 3 results) ?No results for input(s): PROBNP in the last 8760 hours. ?HbA1C: ?Recent Labs  ?  06/09/21 ?1956  ?HGBA1C 6.4*  ? ?CBG: ?Recent Labs  ?Lab 06/09/21 ?2145  ?GLUCAP 136*  ? ?Lipid Profile: ?Recent Labs  ?  06/09/21 ?1956  ?CHOL 96  ?HDL 31*  ?[place] 51  ?TRIG 72  ?CHOLHDL 3.1  ? ?Thyroid Function Tests: ?No results for input(s): TSH, T4TOTAL, FREET4, T3FREE, THYROIDAB in the last 72 hours. ?Anemia Panel: ?No results for input(s): VITAMINB12, FOLATE, FERRITIN, TIBC, IRON, RETICCTPCT in the last 72 hours. ?Sepsis Labs: ?No results for input(s): PROCALCITON, LATICACIDVEN in the last 168 hours. ? ?Recent Results (from the  past 240 hour(s))  ?MRSA Next Gen by PCR, Nasal     Status: None  ? Collection Time: 06/09/21  9:49 PM  ? Specimen: Nasal Mucosa; Nasal Swab  ?Result Value Ref Range Status  ? MRSA by PCR Next Gen NOT DETECTED NOT DETECTED Final  ?  Comment: (NOTE) ?The GeneXpert MRSA Assay (FDA approved for NASAL specimens only), ?is one component of a comprehensive MRSA colonization surveillance ?program. It is not intended to diagnose MRSA infection nor to guide ?or monitor treatment for MRSA infections. ?Test performance is not FDA approved in patients less than 2 years ?old. ?Performed at [facility], [place], ?[place] [address] ?  ?  ? ? ?Radiology Studies: ?CARDIAC CATHETERIZATION ? ?Result Date: 06/09/2021 ?  1st Mrg lesion is 40% stenosed.   Mid LAD lesion is 95% stenosed.   Prox RCA lesion is 30% stenosed.   A drug-eluting stent was successfully placed using a STENT ONYX FRONTIER 2.75X22.   Post intervention, there is a 0% residual stenosis.   There is moderate left ventricular systolic dysfunction.   The left ventricular ejection fraction is 25-35% by visual estimate. 1.  Anterior STEMI 2.  One-vessel coronary artery disease with high-grade 95% ulcerated in-stent stenosis mid LAD 3.  Moderate to severely reduced left ventricular function anterior akinesis and apical dyskinesis 4.  Successful PCI with DES mid LAD Recommendations 1.  Dual antiplatelet therapy uninterrupted for 1 year 2.  Start metoprolol succinate 25 mg daily 3.  Start atorvastatin 80 mg daily 4.  2D echocardiogram  ? ?DG Chest Port 1 View ? ?Result Date: 06/09/2021 ?CLINICAL DATA:  STEMI. EXAM: PORTABLE CHEST 1 VIEW COMPARISON:  Chest radiograph dated 06/24/2004. FINDINGS: The lungs are clear. There is no pleural effusion pneumothorax. The cardiac silhouette is within normal limits. No acute osseous pathology. Degenerative changes of the shoulder. IMPRESSION: No active disease. Electronically Signed   By: Anner Crete M.D.    On: 06/09/2021 20:15  ? ?ECHOCARDIOGRAM COMPLETE ? ?Result Date: 06/10/2021 ?   ECHOCARDIOGRAM REPORT   Patient Name:   ALANDIS BLUEMEL [facility] Date of Exam: 06/10/2021 Medical Rec #:  [id number]         Height:       68.0 in Accession #:    [id number]        Weight:       148.8 lb Date of Birth:  [birth year]          BSA:          1.802 m? Patient Age:    87 years          BP:           136/69 mmHg Patient Gender: M  HR:           69 bpm. Exam Location:  [facility] Procedure: 2D Echo, Color Doppler and Cardiac Doppler Indications:     I21.9 Acute myocardial infarction, unspecified  History:         Patient has no prior history of Echocardiogram examinations.                  Stroke.  Sonographer:     Charmayne Sheer Referring Phys:  [place] Diagnosing Phys: Isaias Cowman MD  Sonographer Comments: Suboptimal parasternal window. IMPRESSIONS  1. Left ventricular ejection fraction, by estimation, is 40 to 45%. The left ventricle has mildly decreased function. The left ventricle demonstrates regional wall motion abnormalities (see scoring diagram/findings for description). Left ventricular diastolic parameters are consistent with Grade I diastolic dysfunction (impaired relaxation).  2. Right ventricular systolic function is normal. The right ventricular size is normal.  3. The mitral valve is normal in structure. No evidence of mitral valve regurgitation. No evidence of mitral stenosis.  4. The aortic valve is normal in structure. Aortic valve regurgitation is not visualized. No aortic stenosis is present.  5. The inferior vena cava is normal in size with greater than 50% respiratory variability, suggesting right atrial pressure of 3 mmHg. FINDINGS  Left Ventricle: Left ventricular ejection fraction, by estimation, is 40 to 45%. The left ventricle has mildly decreased function. The left ventricle demonstrates regional wall motion abnormalities. The left ventricular internal cavity size was normal in  size. There is no left ventricular hypertrophy. Left ventricular diastolic parameters are consistent with Grade I diastolic dysfunction (impaired relaxation).  LV Wall Scoring: The apex is dyskinetic. The apical

## 2021-06-10 NOTE — TOC Initial Note (Signed)
Transition of Care (TOC) - Initial/Assessment Note  ? ? ?Patient Details  ?Name: Hunter Bailey ?MRN: [id number] ?Date of Birth: 27-Jan-1935 ? ?Transition of Care (TOC) CM/SW Contact:    ?Hunter Hutching, RN ?Phone Number: ?06/10/2021, 1:10 PM ? ?Clinical Narrative:                 ?Patient admitted to the hospital for Acute Stemi, s/p cardiac cath.  RNCM met with patient at the bedside, introduced self and explained role in DC planning.  Patient is from [facility].  Patient reports that he has been working with therapy there and doing really well with his walker.  Patient also has a wheelchair.  Patient's next of kin and Hunter Bailey is Hunter Bailey (niece) [phone].   ?Hunter Bailey is aware of hospitalization updated that patient is going well today and reports feeling good.  She will come up and visit this afternoon when she gets off of work.   ? ?Plan for DC is for patient to return to [facility].  PT eval pending.  ? ?Expected Discharge Plan: Assisted Living ?Barriers to Discharge: Continued Medical Work up ? ? ?Patient Goals and CMS Choice ?Patient states their goals for this hospitalization and ongoing recovery are:: to get back home and continue to work with PT ?CMS Medicare.gov Compare Post Acute Care list provided to:: Patient ?Choice offered to / list presented to : Patient ? ?Expected Discharge Plan and Services ?Expected Discharge Plan: Assisted Living ?  ?Discharge Planning Services: CM Consult ?  ?Living arrangements for the past 2 months: [place] ?                ?DME Arranged: N/A ?DME Agency: NA ?  ?  ?  ?HH Arranged: NA ?  ?  ?  ?  ? ?Prior Living Arrangements/Services ?Living arrangements for the past 2 months: [place] ?Lives with:: Facility Resident ?Patient language and need for interpreter reviewed:: Yes ?Do you feel safe going back to the place where you live?: Yes      ?Need for Family Participation in Patient Care: Yes (Comment) ?Care giver support system in  place?: Yes (comment) (niece) ?Current home services: DME (walker, wheelchair) ?Criminal Activity/Legal Involvement Pertinent to Current Situation/Hospitalization: No - Comment as needed ? ?Activities of Daily Living ?  ?  ? ?Permission Sought/Granted ?Permission sought to share information with : Case Manager, Customer service manager, Family Supports ?Permission granted to share information with : Yes, Verbal Permission Granted ? Share Information with NAME: Hunter Bailey ? Permission granted to share info w AGENCY: [facility] ? Permission granted to share info w Relationship: niece ? Permission granted to share info w Contact Information: [phone] or [phone] ? ?Emotional Assessment ?Appearance:: Appears stated age ?Attitude/Demeanor/Rapport: Engaged ?Affect (typically observed): Accepting ?Orientation: : Oriented to Self, Oriented to Place, Oriented to  Time, Oriented to Situation ?Alcohol / Substance Use: Not Applicable ?Psych Involvement: No (comment) ? ?Admission diagnosis:  Acute ST elevation myocardial infarction (STEMI) involving left anterior descending (LAD) coronary artery ([place]) [I21.02] ?Patient Active Problem List  ? Diagnosis Date Noted  ? Acute ST elevation myocardial infarction (STEMI) involving left anterior descending (LAD) coronary artery ([place]) 06/09/2021  ? Chronic kidney disease, stage 3a ([place]) 06/09/2021  ? Benign prostatic hyperplasia 06/09/2021  ? Coronary disease 06/09/2021  ? Gastro-esophageal reflux disease without esophagitis 06/09/2021  ? Other vitamin B12 deficiency anemias 06/09/2021  ? Peripheral vascular disease ([place]) 06/09/2021  ?  Type 2 diabetes mellitus without complications ([place]) 21/94/7125  ? Left hemiparesis ([place]) 01/08/2021  ? ?PCP:  No primary care provider on file. ?Pharmacy:  No Pharmacies Listed ? ? ? ?Social Determinants of Health (SDOH) Interventions ?  ? ?Readmission Risk Interventions ?   ? View : No data to display.  ?  ?  ?  ? ? ? ?

## 2021-06-10 NOTE — Progress Notes (Signed)
[facility] ? ?CARDIOLOGY CONSULT NOTE  ?Patient ID: ?Hunter Bailey ?MRN: [id number] ?DOB/AGE: [birth year] 87 y.o. ? ?Admit date: 06/09/2021 ?Referring Physician [place] ?Primary Physician [facility] ?Primary Cardiologist [place] ?Reason for Consultation anterior STEMI ? ?HPI: 86 year old gentleman referred for evaluation of anterior STEMI.  The patient has a history of prior myocardial infarction and bare-metal stent.  He is status post prior CVA with residual left hemiparesis.  He is a resident at [facility].  Earlier this evening, the patient went to the bathroom, passed out while pulling up his pants.  The patient later complained he needed to turn to the toilet, passed out again.  EMS was called and upon arrival ECG revealed ST elevations in the anterior lateral leads.  He was brought to [facility] ED time he was hypotensive and not very conversant.  Patient was treated with intravenous fluids, blood pressure improved, the patient was able to answer questions.  The patient denies chest pain.  Repeat ECG showed distant ST elevation in leads V4, 5 and 6. ? ?Interval history ?-Seen and examined in the ICU, eager to eat lunch ?-Only complaint is constipation, continues to deny chest pain, shortness of breath. ? ?Review of systems complete and found to be negative unless listed above  ? ?No medications prior to admission.  ? ?Social History  ? ?Socioeconomic History  ? Marital status: Divorced  ?  Spouse name: Not on file  ? Number of children: Not on file  ? Years of education: Not on file  ? Highest education level: Not on file  ?Occupational History  ? Not on file  ?Tobacco Use  ? Smoking status: Unknown  ? Smokeless tobacco: Not on file  ?Substance and Sexual Activity  ? Alcohol use: Not on file  ? Drug use: Not on file  ? Sexual activity: Not on file  ?Other Topics Concern  ? Not on file  ?Social History Narrative  ? Not on file  ? ?Social Determinants of Health  ? ?Financial Resource Strain: Not  on file  ?Food Insecurity: Not on file  ?Transportation Needs: Not on file  ?Physical Activity: Not on file  ?Stress: Not on file  ?Social Connections: Not on file  ?Intimate Partner Violence: Not on file  ?  ?History reviewed. No pertinent family history.  ? ? ?Review of systems complete and found to be negative unless listed above  ? ?PHYSICAL EXAM ? ?General: Elderly black male well nourished, in no acute distress sitting upright in ICU bed ?HEENT:  Normocephalic and atramatic ?Neck:  No JVD.  ?Lungs: Clear bilaterally to auscultation  ?Heart: HRRR . Normal S1 and S2 without gallops or murmurs.  ?Abdomen: Nondistended appearing ?Msk:  Normal strength and tone for age.  LUE flexed and contracted ?Extremities: No clubbing, cyanosis or edema.   ?Neuro: Alert and oriented X 3. ?Psych:  Good affect, responds appropriately ? ?Labs: ?  ?Lab Results  ?Component Value Date  ? WBC 4.1 06/10/2021  ? HGB 11.1 (L) 06/10/2021  ? HCT 33.8 (L) 06/10/2021  ? MCV 93.1 06/10/2021  ? PLT 157 06/10/2021  ?  ?Recent Labs  ?Lab 06/09/21 ?1956 06/10/21 ?0636  ?NA 137 137  ?K 3.2* 3.5  ?CL 102 103  ?CO2 25 28  ?BUN 16 14  ?CREATININE 1.35* 0.99  ?CALCIUM 9.4 8.8*  ?PROT 7.3  --   ?BILITOT 1.0  --   ?ALKPHOS 44  --   ?ALT 20  --   ?AST 23  --   ?  GLUCOSE 200* 108*  ? ? ?No results found for: CKTOTAL, CKMB, CKMBINDEX, TROPONINI  ?Lab Results  ?Component Value Date  ? CHOL 96 06/09/2021  ? ?Lab Results  ?Component Value Date  ? HDL 31 (L) 06/09/2021  ? ?Lab Results  ?Component Value Date  ? LDLCALC 51 06/09/2021  ? ?Lab Results  ?Component Value Date  ? TRIG 72 06/09/2021  ? ?Lab Results  ?Component Value Date  ? CHOLHDL 3.1 06/09/2021  ? ?No results found for: LDLDIRECT  ?  ?Radiology: CARDIAC CATHETERIZATION ? ?Result Date: 06/09/2021 ?  1st Mrg lesion is 40% stenosed.   Mid LAD lesion is 95% stenosed.   Prox RCA lesion is 30% stenosed.   A drug-eluting stent was successfully placed using a STENT ONYX FRONTIER 2.75X22.   Post intervention,  there is a 0% residual stenosis.   There is moderate left ventricular systolic dysfunction.   The left ventricular ejection fraction is 25-35% by visual estimate. 1.  Anterior STEMI 2.  One-vessel coronary artery disease with high-grade 95% ulcerated in-stent stenosis mid LAD 3.  Moderate to severely reduced left ventricular function anterior akinesis and apical dyskinesis 4.  Successful PCI with DES mid LAD Recommendations 1.  Dual antiplatelet therapy uninterrupted for 1 year 2.  Start metoprolol succinate 25 mg daily 3.  Start atorvastatin 80 mg daily 4.  2D echocardiogram  ? ?DG Chest Port 1 View ? ?Result Date: 06/09/2021 ?CLINICAL DATA:  STEMI. EXAM: PORTABLE CHEST 1 VIEW COMPARISON:  Chest radiograph dated 06/24/2004. FINDINGS: The lungs are clear. There is no pleural effusion pneumothorax. The cardiac silhouette is within normal limits. No acute osseous pathology. Degenerative changes of the shoulder. IMPRESSION: No active disease. Electronically Signed   By: Anner Crete M.D.   On: 06/09/2021 20:15  ? ?ECHOCARDIOGRAM COMPLETE ? ?Result Date: 06/10/2021 ?   ECHOCARDIOGRAM REPORT   Patient Name:   Hunter Bailey [facility] Date of Exam: 06/10/2021 Medical Rec #:  [id number]         Height:       68.0 in Accession #:    [id number]        Weight:       148.8 lb Date of Birth:  06/03/1934          BSA:          1.802 m? Patient Age:    51 years          BP:           136/69 mmHg Patient Gender: M                 HR:           69 bpm. Exam Location:  [facility] Procedure: 2D Echo, Color Doppler and Cardiac Doppler Indications:     I21.9 Acute myocardial infarction, unspecified  History:         Patient has no prior history of Echocardiogram examinations.                  Stroke.  Sonographer:     Charmayne Sheer Referring Phys:  [place] Diagnosing Phys: Isaias Cowman MD  Sonographer Comments: Suboptimal parasternal window. IMPRESSIONS  1. Left ventricular ejection fraction, by estimation, is 40 to 45%. The  left ventricle has mildly decreased function. The left ventricle demonstrates regional wall motion abnormalities (see scoring diagram/findings for description). Left ventricular diastolic parameters are consistent with Grade I diastolic dysfunction (impaired relaxation).  2. Right ventricular systolic function is  normal. The right ventricular size is normal.  3. The mitral valve is normal in structure. No evidence of mitral valve regurgitation. No evidence of mitral stenosis.  4. The aortic valve is normal in structure. Aortic valve regurgitation is not visualized. No aortic stenosis is present.  5. The inferior vena cava is normal in size with greater than 50% respiratory variability, suggesting right atrial pressure of 3 mmHg. FINDINGS  Left Ventricle: Left ventricular ejection fraction, by estimation, is 40 to 45%. The left ventricle has mildly decreased function. The left ventricle demonstrates regional wall motion abnormalities. The left ventricular internal cavity size was normal in size. There is no left ventricular hypertrophy. Left ventricular diastolic parameters are consistent with Grade I diastolic dysfunction (impaired relaxation).  LV Wall Scoring: The apex is dyskinetic. The apical lateral segment, apical septal segment, apical anterior segment, and apical inferior segment are akinetic. Right Ventricle: The right ventricular size is normal. No increase in right ventricular wall thickness. Right ventricular systolic function is normal. Left Atrium: Left atrial size was normal in size. Right Atrium: Right atrial size was normal in size. Pericardium: There is no evidence of pericardial effusion. Mitral Valve: The mitral valve is normal in structure. No evidence of mitral valve regurgitation. No evidence of mitral valve stenosis. MV peak gradient, 4.2 mmHg. The mean mitral valve gradient is 1.0 mmHg. Tricuspid Valve: The tricuspid valve is normal in structure. Tricuspid valve regurgitation is not  demonstrated. No evidence of tricuspid stenosis. Aortic Valve: The aortic valve is normal in structure. Aortic valve regurgitation is not visualized. No aortic stenosis is present. Aortic valve mean gradient mea

## 2021-06-10 NOTE — Progress Notes (Signed)
Contacted MD for bowel meds, orders placed. ?

## 2021-06-10 NOTE — Evaluation (Signed)
Physical Therapy Evaluation ?Patient Details ?Name: Hunter Bailey ?MRN: [id number] ?DOB: 1934-10-13 ?Today's Date: 06/10/2021 ? ?History of Present Illness ? Hunter Bailey is a 87 y.o. male coming from the [place].  Per EMS and nursing staff the aerobics who I talked to afterwards patient had gone to the bathroom for the first time in several days.  He apparently passed out while they were pulling up his pants.  As I understand it he then complained after he woke up that he had not finished and he went back on the toilet and passed out again.  EMS arrived and did an EKG which showed ST elevation in V2 through 6. Admitted with STEMI. ?  ?Clinical Impression ? Patient received in bed, trying to have BM on bed pan without success. Patient is agreeable to try to get up to [facility]. Patient required min +2 for bed mobility, transfers and pivoting from bed to [facility]. She will continue to benefit from skilled PT while here to improve functional independence and safety with mobility.     ?   ? ?Recommendations for follow up therapy are one component of a multi-disciplinary discharge planning process, led by the attending physician.  Recommendations may be updated based on patient status, additional functional criteria and insurance authorization. ? ?Follow Up Recommendations Home health PT ? ?  ?Assistance Recommended at Discharge Frequent or constant Supervision/Assistance  ?Patient can return home with the following ? A little help with walking and/or transfers;A little help with bathing/dressing/bathroom ? ?  ?Equipment Recommendations None recommended by PT  ?Recommendations for Other Services ?    ?  ?Functional Status Assessment Patient has had a recent decline in their functional status and demonstrates the ability to make significant improvements in function in a reasonable and predictable amount of time.  ? ?  ?Precautions / Restrictions Precautions ?Precautions: Fall ?Restrictions ?Weight Bearing Restrictions: No  ? ?   ? ?Mobility ? Bed Mobility ?Overal bed mobility: Needs Assistance ?Bed Mobility: Supine to Sit, Sit to Supine ?  ?  ?Supine to sit: Min assist ?Sit to supine: Min assist ?  ?  ?  ? ?Transfers ?Overall transfer level: Needs assistance ?Equipment used: 2 person hand held assist ?Transfers: Sit to/from Stand, Bed to chair/wheelchair/BSC ?Sit to Stand: Min assist ?  ?  ?  ?  ?  ?  ?  ? ?Ambulation/Gait ?  ?  ?  ?  ?  ?  ?  ?General Gait Details: not attempted ? ?Stairs ?  ?  ?  ?  ?  ? ?Wheelchair Mobility ?  ? ?Modified Rankin (Stroke Patients Only) ?  ? ?  ? ?Balance Overall balance assessment: Needs assistance ?Sitting-balance support: Feet supported ?Sitting balance-Leahy Scale: Good ?  ?  ?Standing balance support: Bilateral upper extremity supported, During functional activity ?Standing balance-Leahy Scale: Poor ?Standing balance comment: unsteady wtih transfer to [facility] and back to bed ?  ?  ?  ?  ?  ?  ?  ?  ?  ?  ?  ?   ? ? ? ?Pertinent Vitals/Pain Pain Assessment ?Pain Assessment: No/denies pain  ? ? ?Home Living Family/patient expects to be discharged to:: Assisted living ?  ?  ?  ?  ?  ?  ?  ?  ?Home Equipment: Kasandra Knudsen - single point ?Additional Comments: patient states he was using cane with PT at the [facility]. Prior CVA with L side weakness  ?  ?Prior Function Prior Level of Function :  Needs assist ?  ?  ?  ?Physical Assist : Mobility (physical) ?Mobility (physical): Transfers ?  ?Mobility Comments: patient requires min A for sit to stand /pivot transfers ?  ?  ? ? ?Hand Dominance  ?   ? ?  ?Extremity/Trunk Assessment  ? Upper Extremity Assessment ?Upper Extremity Assessment: LUE deficits/detail ?LUE Deficits / Details: prior CVA, arm contracted ?  ? ?Lower Extremity Assessment ?Lower Extremity Assessment: Generalized weakness ?  ? ?   ?Communication  ? Communication: HOH  ?Cognition Arousal/Alertness: Awake/alert ?Behavior During Therapy: [facility] for tasks assessed/performed ?Overall Cognitive Status: No  family/caregiver present to determine baseline cognitive functioning ?  ?  ?  ?  ?  ?  ?  ?  ?  ?  ?  ?  ?  ?  ?  ?  ?General Comments: patient seems to have some confusion ?  ?  ? ?  ?General Comments   ? ?  ?Exercises    ? ?Assessment/Plan  ?  ?PT Assessment Patient needs continued PT services  ?PT Problem List Decreased strength;Decreased mobility;Decreased activity tolerance;Decreased balance;Decreased safety awareness;Decreased knowledge of precautions;Decreased knowledge of use of DME;Decreased cognition ? ?   ?  ?PT Treatment Interventions DME instruction;Therapeutic exercise;Functional mobility training;Therapeutic activities;Patient/family education;Balance training;Gait training   ? ?PT Goals (Current goals can be found in the Care Plan section)  ?Acute Rehab PT Goals ?Patient Stated Goal: return to the [facility] ?PT Goal Formulation: With patient ?Time For Goal Achievement: 06/24/21 ?Potential to Achieve Goals: Good ? ?  ?Frequency Min 2X/week ?  ? ? ?Co-evaluation   ?  ?  ?  ?  ? ? ?  ?AM-PAC PT "6 Clicks" Mobility  ?Outcome Measure Help needed turning from your back to your side while in a flat bed without using bedrails?: A Little ?Help needed moving from lying on your back to sitting on the side of a flat bed without using bedrails?: A Little ?Help needed moving to and from a bed to a chair (including a wheelchair)?: A Little ?Help needed standing up from a chair using your arms (e.g., wheelchair or bedside chair)?: A Little ?Help needed to walk in hospital room?: A Lot ?Help needed climbing 3-5 steps with a railing? : A Lot ?6 Click Score: 16 ? ?  ?End of Session   ?Activity Tolerance: Patient tolerated treatment well ?Patient left: in bed;with call bell/phone within reach;with bed alarm set ?Nurse Communication: Mobility status ?PT Visit Diagnosis: Unsteadiness on feet (R26.81);Other abnormalities of gait and mobility (R26.89);Muscle weakness (generalized) (M62.81);Difficulty in walking, not elsewhere  classified (R26.2) ?  ? ?Time: 3734-2876 ?PT Time Calculation (min) (ACUTE ONLY): 17 min ? ? ?Charges:   PT Evaluation ?$PT Eval Moderate Complexity: 1 Mod ?  ?  ?   ? ? ?Amanda Cockayne, PT, GCS ?06/10/21,3:48 PM ? ? ?

## 2021-06-11 LAB — BASIC METABOLIC PANEL
Anion gap: 6 (ref 5–15)
BUN: 11 mg/dL (ref 8–23)
CO2: 27 mmol/L (ref 22–32)
Calcium: 9 mg/dL (ref 8.9–10.3)
Chloride: 106 mmol/L (ref 98–111)
Creatinine, Ser: 1.1 mg/dL (ref 0.61–1.24)
GFR, Estimated: >60 mL/min (ref >60)
Glucose, Bld: 122 mg/dL — ABNORMAL HIGH (ref 70–99)
Potassium: 3.3 mmol/L — ABNORMAL LOW (ref 3.5–5.1)
Sodium: 139 mmol/L (ref 135–145)

## 2021-06-11 LAB — MAGNESIUM: Magnesium: 1.9 mg/dL (ref 1.7–2.4)

## 2021-06-11 LAB — CBC
HCT: 34.1 % — ABNORMAL LOW (ref 39.0–52.0)
Hemoglobin: 11.1 g/dL — ABNORMAL LOW (ref 13.0–17.0)
MCH: 30 pg (ref 26.0–34.0)
MCHC: 32.6 g/dL (ref 30.0–36.0)
MCV: 92.2 fL (ref 80.0–100.0)
Platelets: 163 10*3/uL (ref 150–400)
RBC: 3.7 MIL/uL — ABNORMAL LOW (ref 4.22–5.81)
RDW: 13.4 % (ref 11.5–15.5)
WBC: 3.6 10*3/uL — ABNORMAL LOW (ref 4.0–10.5)
nRBC: 0 % (ref 0.0–0.2)

## 2021-06-11 MED ORDER — ASPIRIN 81 MG PO CHEW
81.0000 mg | CHEWABLE_TABLET | Freq: Every day | ORAL | 11 refills | Status: AC
Start: 2021-06-11 — End: 2022-06-11

## 2021-06-11 MED ORDER — ATORVASTATIN CALCIUM 80 MG PO TABS: 80.0000 mg | ORAL_TABLET | Freq: Every day | ORAL | 2 refills | Status: DC

## 2021-06-11 MED ORDER — METOPROLOL SUCCINATE ER 25 MG PO TB24: 25.0000 mg | ORAL_TABLET | Freq: Every day | ORAL | 2 refills | Status: DC

## 2021-06-11 MED ORDER — LISINOPRIL 2.5 MG PO TABS: 2.5000 mg | ORAL_TABLET | Freq: Every day | ORAL | 2 refills | Status: DC

## 2021-06-11 MED ORDER — POLYETHYLENE GLYCOL 3350 17 G PO PACK: 17.0000 g | PACK | Freq: Every day | ORAL | 0 refills | Status: DC

## 2021-06-11 MED ORDER — POLYETHYLENE GLYCOL 3350 17 G PO PACK: 17.0000 g | PACK | Freq: Every day | ORAL | 2 refills | Status: AC

## 2021-06-11 MED ORDER — CLOPIDOGREL BISULFATE 75 MG PO TABS: 75.0000 mg | ORAL_TABLET | Freq: Every day | ORAL | 11 refills | Status: DC

## 2021-06-11 MED ORDER — CLOPIDOGREL BISULFATE 75 MG PO TABS: 75.0000 mg | ORAL_TABLET | Freq: Every day | ORAL | 11 refills | Status: AC

## 2021-06-11 MED ORDER — ASPIRIN 81 MG PO CHEW: 81.0000 mg | CHEWABLE_TABLET | Freq: Every day | ORAL | Status: DC

## 2021-06-11 MED ORDER — POTASSIUM CHLORIDE CRYS ER 20 MEQ PO TBCR
40.0000 meq | EXTENDED_RELEASE_TABLET | Freq: Once | ORAL | Status: AC
  Administered 2021-06-11: 40 meq via ORAL
  Filled 2021-06-11: qty 2

## 2021-06-11 NOTE — Plan of Care (Signed)
  Problem: Education: Goal: Knowledge of General Education information will improve Description Including pain rating scale, medication(s)/side effects and non-pharmacologic comfort measures Outcome: Progressing   Problem: Health Behavior/Discharge Planning: Goal: Ability to manage health-related needs will improve Outcome: Progressing   

## 2021-06-11 NOTE — Final Progress Note (Signed)
Pt. Discharged to the [facility] with their internal transport. Pt. V/S are stable and he is in good spirits. Pt. Educated and all questions answered. Packet given to driver for receiving nursing staff instructions. ?

## 2021-06-11 NOTE — [place] FL2 (Signed)
?[place] MEDICAID FL2 LEVEL OF CARE SCREENING TOOL  ?  ? ?IDENTIFICATION  ?Patient Name: ?Hunter Bailey Birthdate: 1934-05-06 Sex: male Admission Date (Current Location): ?06/09/2021  ?[place] and [place] Number: ? [place] ?  Facility and Address:  ?[facility], [address], [place], [place] [address] ?     Provider Number: ?[id number]  ?Attending Physician Name and Address:  ?Enzo Bi, MD ? Relative Name and Phone Number:  ?Prestyn Stanco (niece) [phone] ?   ?Current Level of Care: ?Hospital Recommended Level of Care: ?Assisted Living Facility Prior Approval Number: ?  ? ?Date Approved/Denied: ?  PASRR Number: ?  ? ?Discharge Plan: ?Domiciliary (Rest home) (ALF) ?  ? ?Current Diagnoses: ?Patient Active Problem List  ? Diagnosis Date Noted  ? Acute ST elevation myocardial infarction (STEMI) involving left anterior descending (LAD) coronary artery ([place]) 06/09/2021  ? Chronic kidney disease, stage 3a ([place]) 06/09/2021  ? Benign prostatic hyperplasia 06/09/2021  ? Coronary disease 06/09/2021  ? Gastro-esophageal reflux disease without esophagitis 06/09/2021  ? Other vitamin B12 deficiency anemias 06/09/2021  ? Peripheral vascular disease ([place]) 06/09/2021  ? Type 2 diabetes mellitus without complications ([place]) 42/70/6237  ? Left hemiparesis ([place]) 01/08/2021  ? ? ?Orientation RESPIRATION BLADDER Height & Weight   ?  ?Self, Time, Situation, Place ? Normal Continent Weight: 67.5 kg ?Height:  '5\' 8"'$  (172.7 cm)  ?BEHAVIORAL SYMPTOMS/MOOD NEUROLOGICAL BOWEL NUTRITION STATUS  ?    Continent Diet (Heart Healthy)  ?AMBULATORY STATUS COMMUNICATION OF NEEDS Skin   ?Supervision Verbally Normal ?  ?  ?  ?    ?     ?     ? ? ?Personal Care Assistance Level of Assistance  ?Bathing, Feeding, Dressing Bathing Assistance: Limited assistance ?Feeding assistance: Independent ?Dressing Assistance: Limited assistance ?   ? ?Functional Limitations Info  ?Sight, Hearing, Speech Sight Info: Adequate ?Hearing  Info: Adequate ?Speech Info: Adequate  ? ? ?SPECIAL CARE FACTORS FREQUENCY  ?PT (By licensed PT)   ?  ?PT Frequency: [facility] PT 2-3 times per week ?  ?  ?  ?  ?   ? ? ?Contractures Contractures Info: Not present  ? ? ?Additional Factors Info  ?Code Status, Allergies Code Status Info: Full ?Allergies Info: NKA ?  ?  ?  ?   ? ?Current Medications (06/11/2021):  This is the current hospital active medication list ?Current Facility-Administered Medications  ?Medication Dose Route Frequency Provider Last Rate Last Admin  ? 0.9 %  sodium chloride infusion   Intravenous Continuous Nena Polio, MD   Stopped at 06/10/21 1137  ? 0.9 %  sodium chloride infusion  250 mL Intravenous Continuous Benita Gutter, RPH   Held at 06/09/21 2205  ? 0.9 %  sodium chloride infusion  250 mL Intravenous PRN Paraschos, Alexander, MD      ? acetaminophen (TYLENOL) tablet 650 mg  650 mg Oral Q4H PRN Isaias Cowman, MD   650 mg at 06/10/21 2137  ? aspirin chewable tablet 81 mg  81 mg Oral Daily Paraschos, Alexander, MD   81 mg at 06/10/21 6283  ? atorvastatin (LIPITOR) tablet 80 mg  80 mg Oral Daily Paraschos, Alexander, MD   80 mg at 06/10/21 1517  ? Chlorhexidine Gluconate Cloth 2 % PADS 6 each  6 each Topical Daily Paraschos, Alexander, MD   6 each at 06/10/21 [phone]  ? clopidogrel (PLAVIX) tablet 75 mg  75 mg Oral Q breakfast Paraschos, Alexander, MD   75  mg at 06/10/21 4854  ? lisinopril (ZESTRIL) tablet 2.5 mg  2.5 mg Oral Daily Tang, Alanson Puls, PA-C      ? metoprolol succinate (TOPROL-XL) 24 hr tablet 25 mg  25 mg Oral Daily Paraschos, Alexander, MD   25 mg at 06/10/21 6270  ? ondansetron (ZOFRAN) injection 4 mg  4 mg Intravenous Q6H PRN Paraschos, Alexander, MD      ? potassium chloride SA (KLOR-CON M) CR tablet 40 mEq  40 mEq Oral Once Enzo Bi, MD      ? sodium chloride flush (NS) 0.9 % injection 3 mL  3 mL Intravenous Q12H Paraschos, Alexander, MD   3 mL at 06/10/21 2030  ? sodium chloride flush (NS) 0.9 % injection  3 mL  3 mL Intravenous PRN Paraschos, Alexander, MD      ? ? ? ?Discharge Medications: ? ?Medication List  ?   ?  ?TAKE these medications   ?  ?aspirin 81 MG chewable tablet ?Chew 1 tablet (81 mg total) by mouth daily. ?   ?atorvastatin 80 MG tablet ?Commonly known as: LIPITOR ?Take 1 tablet (80 mg total) by mouth daily. ?   ?clopidogrel 75 MG tablet ?Commonly known as: PLAVIX ?Take 1 tablet (75 mg total) by mouth daily with breakfast. ?   ?lisinopril 2.5 MG tablet ?Commonly known as: ZESTRIL ?Take 1 tablet (2.5 mg total) by mouth daily. ?   ?metoprolol succinate 25 MG 24 hr tablet ?Commonly known as: TOPROL-XL ?Take 1 tablet (25 mg total) by mouth daily. Take with or immediately following a meal. ?   ?polyethylene glycol 17 g packet ?Commonly known as: MiraLax ?Take 17 g by mouth daily. ?   ?  ?   ? ? ? ?Relevant Imaging Results: ? ?Relevant Lab Results: ? ? ?Additional Information ?  ? ?Shelbie Hutching, RN ? ? ? ? ?

## 2021-06-11 NOTE — TOC Transition Note (Signed)
Transition of Care (TOC) - CM/SW Discharge Note ? ? ?Patient Details  ?Name: Hunter Bailey ?MRN: [id number] ?Date of Birth: [birth year] ? ?Transition of Care (TOC) CM/SW Contact:  ?Shelbie Hutching, RN ?Phone Number: ?06/11/2021, 9:18 AM ? ? ?Clinical Narrative:    ?Patient is medically cleared for discharge back to [facility] today.  DC summary and FL2 faxed to [phone].  RNCM spoke with Rachel Bo at [facility] and he is aware of patient discharge today.  [facility] will provide transportation, they will be picking up the patient today at 32 am.  Patient's niece [name] notified of DC, voicemail left for her.  ? ? ?Final next level of care: Assisted Living ?Barriers to Discharge: Barriers Resolved ? ? ?Patient Goals and CMS Choice ?Patient states their goals for this hospitalization and ongoing recovery are:: to get back home and continue to work with PT ?CMS Medicare.gov Compare Post Acute Care list provided to:: Patient ?Choice offered to / list presented to : Patient ? ?Discharge Placement ?  ?           ?Patient chooses bed at: Other - please specify in the comment section below: ([facility]) ?Patient to be transferred to facility by: [facility] transportation ?Name of family member notified: [name] ?Patient and family notified of of transfer: 06/11/21 ? ?Discharge Plan and Services ?  ?Discharge Planning Services: CM Consult ?           ?DME Arranged: N/A ?DME Agency: NA ?  ?  ?  ?HH Arranged: PT ?[place] Agency: [place] ?Date HH Agency Contacted: 06/11/21 ?[place]: [phone] ?Representative spoke with at [place]: [facility] ? ?Social Determinants of Health (SDOH) Interventions ?  ? ? ?Readmission Risk Interventions ?   ? View : No data to display.  ?  ?  ?  ? ? ? ? ? ?

## 2021-06-11 NOTE — Discharge Summary (Signed)
? ?Physician Discharge Summary ? ? ?Hunter Bailey  male DOB: 03-09-1935  ?TJQ:[id number] ? ?PCP: No primary care provider on file. ? ?Admit date: 06/09/2021 ?Discharge date: 06/11/2021 ? ?Admitted From: ALF ?Disposition:  ALF ?Home Health: Yes ?CODE STATUS: Full code ? ?Discharge Instructions   ? ? AMB Referral to Cardiac Rehabilitation - Phase II   Complete by: As directed ?  ? Diagnosis:  STEMI ?Coronary Stents  ?  ? After initial evaluation and assessments completed: Virtual Based Care may be provided alone or in conjunction with Phase 2 Cardiac Rehab based on patient barriers.: Yes  ? Discharge instructions   Complete by: As directed ?  ? Since you have a new stent, you need to take aspirin 81 mg and Plavix together for 1 year. ? ?Cardiologist also started you on Lipitor, Lisinopril and Toprol.  Please take them as directed.   ? ?Take Miralax daily to prevent constipation. ? ? ?Dr. Enzo Bi ?- ?-  ? ?  ? ?Hospital Course:  ?For full details, please see H&P, progress notes, consult notes and ancillary notes.  ?Briefly,  ?Hunter Bailey is a 87 y.o. male with medical history significant for prior myocardial infarction and bare-metal stent, CVA with residual left hemiparesis, who presented from [facility] with syncope x2, and found to have STEMI.     ?  ?Anterior ST elevation myocardial infarction ?--with atypical presentation, following syncope x2, without chest pain, with ECG revealing elevation in leads V3,4,5 and 6.  Emergent cardiac catheterization revealed ulcerated 99% in-stent restenosis mid LAD.   ?--successful PCI with DES mid LAD. ?--Need to be on ASA 81 and plavix for 1 year ?--Started on lipitor 80 mg daily ?--outpatient followup with cardiology Dr. Clayborn Bigness ?  ?Acute combined CHF ?--Post-revascularization Echocardiogram complete resulted with mildly reduced EF 40-45% with G1 DD and without valvular insufficiencies.   ?--started on metoprolol succinate 25 mg daily and Lisinopril 2.5 mg  daily  ?  ?Hypokalemia ?--Monitored and repleted PRN ?  ?Acute kidney injury, resolved ?--Cr 1.35 on presentation.  Improved to 0.99 next day.   ?  ?Prediabetes ?--A1c 6.4, no need to start hypoglycemics ? ?Constipation ?--received aggressive Miralax with good result in BM's. ?--cont Miralax daily to maintain regular BM  ? ? ?Discharge Diagnoses:  ?Principal Problem: ?  Acute ST elevation myocardial infarction (STEMI) involving left anterior descending (LAD) coronary artery ([place]) ? ? ?30 Day Unplanned Readmission Risk Score   ? ?Flowsheet Row ED to Hosp-Admission (Current) from 06/09/2021 in [place]  ?30 Day Unplanned Readmission Risk Score (%) 9.58 Filed at 06/11/2021 0801  ? ?  ? ? This score is the patient's risk of an unplanned readmission within 30 days of being discharged (0 -100%). The score is based on dignosis, age, lab data, medications, orders, and past utilization.   ?Low:  0-14.9   Medium: 15-21.9   High: 22-29.9   Extreme: 30 and above ? ?  ? ?  ? ? ?Discharge Instructions: ? ?Allergies as of 06/11/2021   ?Not on File ?  ? ?  ?Medication List  ?  ? ?TAKE these medications   ? ?aspirin 81 MG chewable tablet ?Chew 1 tablet (81 mg total) by mouth daily. ?  ?atorvastatin 80 MG tablet ?Commonly known as: LIPITOR ?Take 1 tablet (80 mg total) by mouth daily. ?  ?clopidogrel 75 MG tablet ?Commonly known as: PLAVIX ?Take 1 tablet (75 mg total) by mouth daily with  breakfast. ?  ?lisinopril 2.5 MG tablet ?Commonly known as: ZESTRIL ?Take 1 tablet (2.5 mg total) by mouth daily. ?  ?metoprolol succinate 25 MG 24 hr tablet ?Commonly known as: TOPROL-XL ?Take 1 tablet (25 mg total) by mouth daily. Take with or immediately following a meal. ?  ?polyethylene glycol 17 g packet ?Commonly known as: MiraLax ?Take 17 g by mouth daily. ?  ? ?  ? ? ? ? ?Not on File ? ? ?The results of significant diagnostics from this hospitalization (including imaging, microbiology, ancillary and laboratory)  are listed below for reference.  ? ?Consultations: ? ? ?Procedures/Studies: ?CARDIAC CATHETERIZATION ? ?Result Date: 06/09/2021 ?  1st Mrg lesion is 40% stenosed.   Mid LAD lesion is 95% stenosed.   Prox RCA lesion is 30% stenosed.   A drug-eluting stent was successfully placed using a STENT ONYX FRONTIER 2.75X22.   Post intervention, there is a 0% residual stenosis.   There is moderate left ventricular systolic dysfunction.   The left ventricular ejection fraction is 25-35% by visual estimate. 1.  Anterior STEMI 2.  One-vessel coronary artery disease with high-grade 95% ulcerated in-stent stenosis mid LAD 3.  Moderate to severely reduced left ventricular function anterior akinesis and apical dyskinesis 4.  Successful PCI with DES mid LAD Recommendations 1.  Dual antiplatelet therapy uninterrupted for 1 year 2.  Start metoprolol succinate 25 mg daily 3.  Start atorvastatin 80 mg daily 4.  2D echocardiogram  ? ?DG Chest Port 1 View ? ?Result Date: 06/09/2021 ?CLINICAL DATA:  STEMI. EXAM: PORTABLE CHEST 1 VIEW COMPARISON:  Chest radiograph dated 06/24/2004. FINDINGS: The lungs are clear. There is no pleural effusion pneumothorax. The cardiac silhouette is within normal limits. No acute osseous pathology. Degenerative changes of the shoulder. IMPRESSION: No active disease. Electronically Signed   By: Anner Crete M.D.   On: 06/09/2021 20:15  ? ?ECHOCARDIOGRAM COMPLETE ? ?Result Date: 06/10/2021 ?   ECHOCARDIOGRAM REPORT   Patient Name:   Hunter Bailey [facility] Date of Exam: 06/10/2021 Medical Rec #:  [id number]         Height:       68.0 in Accession #:    [id number]        Weight:       148.8 lb Date of Birth:  01-Nov-1934          BSA:          1.802 m? Patient Age:    54 years          BP:           136/69 mmHg Patient Gender: M                 HR:           69 bpm. Exam Location:  [facility] Procedure: 2D Echo, Color Doppler and Cardiac Doppler Indications:     I21.9 Acute myocardial infarction, unspecified  History:          Patient has no prior history of Echocardiogram examinations.                  Stroke.  Sonographer:     Charmayne Sheer Referring Phys:  Russell Diagnosing Phys: Isaias Cowman MD  Sonographer Comments: Suboptimal parasternal window. IMPRESSIONS  1. Left ventricular ejection fraction, by estimation, is 40 to 45%. The left ventricle has mildly decreased function. The left ventricle demonstrates regional wall motion abnormalities (see scoring diagram/findings for description). Left ventricular diastolic parameters are  consistent with Grade I diastolic dysfunction (impaired relaxation).  2. Right ventricular systolic function is normal. The right ventricular size is normal.  3. The mitral valve is normal in structure. No evidence of mitral valve regurgitation. No evidence of mitral stenosis.  4. The aortic valve is normal in structure. Aortic valve regurgitation is not visualized. No aortic stenosis is present.  5. The inferior vena cava is normal in size with greater than 50% respiratory variability, suggesting right atrial pressure of 3 mmHg. FINDINGS  Left Ventricle: Left ventricular ejection fraction, by estimation, is 40 to 45%. The left ventricle has mildly decreased function. The left ventricle demonstrates regional wall motion abnormalities. The left ventricular internal cavity size was normal in size. There is no left ventricular hypertrophy. Left ventricular diastolic parameters are consistent with Grade I diastolic dysfunction (impaired relaxation).  LV Wall Scoring: The apex is dyskinetic. The apical lateral segment, apical septal segment, apical anterior segment, and apical inferior segment are akinetic. Right Ventricle: The right ventricular size is normal. No increase in right ventricular wall thickness. Right ventricular systolic function is normal. Left Atrium: Left atrial size was normal in size. Right Atrium: Right atrial size was normal in size. Pericardium: There is no evidence  of pericardial effusion. Mitral Valve: The mitral valve is normal in structure. No evidence of mitral valve regurgitation. No evidence of mitral valve stenosis. MV peak gradient, 4.2 mmHg. The mean mitral

## 2021-06-12 DIAGNOSIS — I10 Essential (primary) hypertension: Secondary | ICD-10-CM | POA: Diagnosis not present

## 2021-06-12 DIAGNOSIS — I69398 Other sequelae of cerebral infarction: Secondary | ICD-10-CM | POA: Diagnosis not present

## 2021-06-12 DIAGNOSIS — R1312 Dysphagia, oropharyngeal phase: Secondary | ICD-10-CM | POA: Diagnosis not present

## 2021-06-12 DIAGNOSIS — I69828 Other speech and language deficits following other cerebrovascular disease: Secondary | ICD-10-CM | POA: Diagnosis not present

## 2021-06-12 DIAGNOSIS — I69354 Hemiplegia and hemiparesis following cerebral infarction affecting left non-dominant side: Secondary | ICD-10-CM | POA: Diagnosis not present

## 2021-06-12 DIAGNOSIS — I69314 Frontal lobe and executive function deficit following cerebral infarction: Secondary | ICD-10-CM | POA: Diagnosis not present

## 2021-06-12 DIAGNOSIS — I69322 Dysarthria following cerebral infarction: Secondary | ICD-10-CM | POA: Diagnosis not present

## 2021-06-12 DIAGNOSIS — I69391 Dysphagia following cerebral infarction: Secondary | ICD-10-CM | POA: Diagnosis not present

## 2021-06-12 DIAGNOSIS — I509 Heart failure, unspecified: Secondary | ICD-10-CM | POA: Diagnosis not present

## 2021-06-12 DIAGNOSIS — M6281 Muscle weakness (generalized): Secondary | ICD-10-CM | POA: Diagnosis not present

## 2021-06-15 DIAGNOSIS — I82409 Acute embolism and thrombosis of unspecified deep veins of unspecified lower extremity: Secondary | ICD-10-CM | POA: Diagnosis not present

## 2021-06-15 DIAGNOSIS — M6281 Muscle weakness (generalized): Secondary | ICD-10-CM | POA: Diagnosis not present

## 2021-06-15 DIAGNOSIS — K5901 Slow transit constipation: Secondary | ICD-10-CM | POA: Diagnosis not present

## 2021-06-15 DIAGNOSIS — I69398 Other sequelae of cerebral infarction: Secondary | ICD-10-CM | POA: Diagnosis not present

## 2021-06-15 DIAGNOSIS — I69314 Frontal lobe and executive function deficit following cerebral infarction: Secondary | ICD-10-CM | POA: Diagnosis not present

## 2021-06-15 DIAGNOSIS — I639 Cerebral infarction, unspecified: Secondary | ICD-10-CM | POA: Diagnosis not present

## 2021-06-15 DIAGNOSIS — I69828 Other speech and language deficits following other cerebrovascular disease: Secondary | ICD-10-CM | POA: Diagnosis not present

## 2021-06-15 DIAGNOSIS — I69354 Hemiplegia and hemiparesis following cerebral infarction affecting left non-dominant side: Secondary | ICD-10-CM | POA: Diagnosis not present

## 2021-06-15 DIAGNOSIS — I69322 Dysarthria following cerebral infarction: Secondary | ICD-10-CM | POA: Diagnosis not present

## 2021-06-15 DIAGNOSIS — I509 Heart failure, unspecified: Secondary | ICD-10-CM | POA: Diagnosis not present

## 2021-06-15 DIAGNOSIS — I219 Acute myocardial infarction, unspecified: Secondary | ICD-10-CM | POA: Diagnosis not present

## 2021-06-15 DIAGNOSIS — I69391 Dysphagia following cerebral infarction: Secondary | ICD-10-CM | POA: Diagnosis not present

## 2021-06-15 DIAGNOSIS — R1312 Dysphagia, oropharyngeal phase: Secondary | ICD-10-CM | POA: Diagnosis not present

## 2021-06-15 DIAGNOSIS — I10 Essential (primary) hypertension: Secondary | ICD-10-CM | POA: Diagnosis not present

## 2021-06-16 DIAGNOSIS — I69322 Dysarthria following cerebral infarction: Secondary | ICD-10-CM | POA: Diagnosis not present

## 2021-06-16 DIAGNOSIS — I69314 Frontal lobe and executive function deficit following cerebral infarction: Secondary | ICD-10-CM | POA: Diagnosis not present

## 2021-06-16 DIAGNOSIS — M6281 Muscle weakness (generalized): Secondary | ICD-10-CM | POA: Diagnosis not present

## 2021-06-16 DIAGNOSIS — I69354 Hemiplegia and hemiparesis following cerebral infarction affecting left non-dominant side: Secondary | ICD-10-CM | POA: Diagnosis not present

## 2021-06-16 DIAGNOSIS — I509 Heart failure, unspecified: Secondary | ICD-10-CM | POA: Diagnosis not present

## 2021-06-16 DIAGNOSIS — I69391 Dysphagia following cerebral infarction: Secondary | ICD-10-CM | POA: Diagnosis not present

## 2021-06-16 DIAGNOSIS — I69398 Other sequelae of cerebral infarction: Secondary | ICD-10-CM | POA: Diagnosis not present

## 2021-06-16 DIAGNOSIS — R1312 Dysphagia, oropharyngeal phase: Secondary | ICD-10-CM | POA: Diagnosis not present

## 2021-06-16 DIAGNOSIS — I69828 Other speech and language deficits following other cerebrovascular disease: Secondary | ICD-10-CM | POA: Diagnosis not present

## 2021-06-16 DIAGNOSIS — I10 Essential (primary) hypertension: Secondary | ICD-10-CM | POA: Diagnosis not present

## 2021-06-17 DIAGNOSIS — I69314 Frontal lobe and executive function deficit following cerebral infarction: Secondary | ICD-10-CM | POA: Diagnosis not present

## 2021-06-17 DIAGNOSIS — I509 Heart failure, unspecified: Secondary | ICD-10-CM | POA: Diagnosis not present

## 2021-06-17 DIAGNOSIS — I69391 Dysphagia following cerebral infarction: Secondary | ICD-10-CM | POA: Diagnosis not present

## 2021-06-17 DIAGNOSIS — I69322 Dysarthria following cerebral infarction: Secondary | ICD-10-CM | POA: Diagnosis not present

## 2021-06-17 DIAGNOSIS — I10 Essential (primary) hypertension: Secondary | ICD-10-CM | POA: Diagnosis not present

## 2021-06-17 DIAGNOSIS — M6281 Muscle weakness (generalized): Secondary | ICD-10-CM | POA: Diagnosis not present

## 2021-06-17 DIAGNOSIS — I69828 Other speech and language deficits following other cerebrovascular disease: Secondary | ICD-10-CM | POA: Diagnosis not present

## 2021-06-17 DIAGNOSIS — R1312 Dysphagia, oropharyngeal phase: Secondary | ICD-10-CM | POA: Diagnosis not present

## 2021-06-17 DIAGNOSIS — I69354 Hemiplegia and hemiparesis following cerebral infarction affecting left non-dominant side: Secondary | ICD-10-CM | POA: Diagnosis not present

## 2021-06-17 DIAGNOSIS — I69398 Other sequelae of cerebral infarction: Secondary | ICD-10-CM | POA: Diagnosis not present

## 2021-06-22 ENCOUNTER — Encounter: Payer: Self-pay | Admitting: Cardiology

## 2021-06-22 DIAGNOSIS — R1312 Dysphagia, oropharyngeal phase: Secondary | ICD-10-CM | POA: Diagnosis not present

## 2021-06-22 DIAGNOSIS — I69398 Other sequelae of cerebral infarction: Secondary | ICD-10-CM | POA: Diagnosis not present

## 2021-06-22 DIAGNOSIS — I69828 Other speech and language deficits following other cerebrovascular disease: Secondary | ICD-10-CM | POA: Diagnosis not present

## 2021-06-22 DIAGNOSIS — I69314 Frontal lobe and executive function deficit following cerebral infarction: Secondary | ICD-10-CM | POA: Diagnosis not present

## 2021-06-22 DIAGNOSIS — I69322 Dysarthria following cerebral infarction: Secondary | ICD-10-CM | POA: Diagnosis not present

## 2021-06-22 DIAGNOSIS — I69391 Dysphagia following cerebral infarction: Secondary | ICD-10-CM | POA: Diagnosis not present

## 2021-06-22 DIAGNOSIS — I69354 Hemiplegia and hemiparesis following cerebral infarction affecting left non-dominant side: Secondary | ICD-10-CM | POA: Diagnosis not present

## 2021-06-22 DIAGNOSIS — M6281 Muscle weakness (generalized): Secondary | ICD-10-CM | POA: Diagnosis not present

## 2021-06-22 DIAGNOSIS — I10 Essential (primary) hypertension: Secondary | ICD-10-CM | POA: Diagnosis not present

## 2021-06-22 DIAGNOSIS — I509 Heart failure, unspecified: Secondary | ICD-10-CM | POA: Diagnosis not present

## 2021-06-22 MED ORDER — CLOPIDOGREL BISULFATE 75 MG PO TABS
ORAL_TABLET | ORAL | Status: AC | PRN
  Administered 2021-06-09: 600 mg via ORAL

## 2021-06-24 DIAGNOSIS — I6930 Unspecified sequelae of cerebral infarction: Secondary | ICD-10-CM | POA: Diagnosis not present

## 2021-06-24 DIAGNOSIS — I509 Heart failure, unspecified: Secondary | ICD-10-CM | POA: Diagnosis not present

## 2021-06-24 DIAGNOSIS — I5041 Acute combined systolic (congestive) and diastolic (congestive) heart failure: Secondary | ICD-10-CM | POA: Diagnosis not present

## 2021-06-24 DIAGNOSIS — I2102 ST elevation (STEMI) myocardial infarction involving left anterior descending coronary artery: Secondary | ICD-10-CM | POA: Diagnosis not present

## 2021-06-24 DIAGNOSIS — E782 Mixed hyperlipidemia: Secondary | ICD-10-CM | POA: Diagnosis not present

## 2021-06-24 DIAGNOSIS — I69828 Other speech and language deficits following other cerebrovascular disease: Secondary | ICD-10-CM | POA: Diagnosis not present

## 2021-06-24 DIAGNOSIS — R7303 Prediabetes: Secondary | ICD-10-CM | POA: Diagnosis not present

## 2021-06-24 DIAGNOSIS — I2510 Atherosclerotic heart disease of native coronary artery without angina pectoris: Secondary | ICD-10-CM | POA: Diagnosis not present

## 2021-06-24 DIAGNOSIS — I69391 Dysphagia following cerebral infarction: Secondary | ICD-10-CM | POA: Diagnosis not present

## 2021-06-24 DIAGNOSIS — Z955 Presence of coronary angioplasty implant and graft: Secondary | ICD-10-CM | POA: Diagnosis not present

## 2021-06-24 DIAGNOSIS — R1312 Dysphagia, oropharyngeal phase: Secondary | ICD-10-CM | POA: Diagnosis not present

## 2021-06-24 DIAGNOSIS — I69314 Frontal lobe and executive function deficit following cerebral infarction: Secondary | ICD-10-CM | POA: Diagnosis not present

## 2021-06-24 DIAGNOSIS — I69398 Other sequelae of cerebral infarction: Secondary | ICD-10-CM | POA: Diagnosis not present

## 2021-06-24 DIAGNOSIS — I69322 Dysarthria following cerebral infarction: Secondary | ICD-10-CM | POA: Diagnosis not present

## 2021-06-24 DIAGNOSIS — I10 Essential (primary) hypertension: Secondary | ICD-10-CM | POA: Diagnosis not present

## 2021-06-24 DIAGNOSIS — I69354 Hemiplegia and hemiparesis following cerebral infarction affecting left non-dominant side: Secondary | ICD-10-CM | POA: Diagnosis not present

## 2021-06-24 DIAGNOSIS — M6281 Muscle weakness (generalized): Secondary | ICD-10-CM | POA: Diagnosis not present

## 2021-06-25 DIAGNOSIS — I639 Cerebral infarction, unspecified: Secondary | ICD-10-CM | POA: Diagnosis not present

## 2021-06-25 DIAGNOSIS — I219 Acute myocardial infarction, unspecified: Secondary | ICD-10-CM | POA: Diagnosis not present

## 2021-06-25 DIAGNOSIS — E119 Type 2 diabetes mellitus without complications: Secondary | ICD-10-CM | POA: Diagnosis not present

## 2021-06-25 DIAGNOSIS — I10 Essential (primary) hypertension: Secondary | ICD-10-CM | POA: Diagnosis not present

## 2021-06-25 DIAGNOSIS — E559 Vitamin D deficiency, unspecified: Secondary | ICD-10-CM | POA: Diagnosis not present

## 2021-06-25 DIAGNOSIS — E038 Other specified hypothyroidism: Secondary | ICD-10-CM | POA: Diagnosis not present

## 2021-06-25 DIAGNOSIS — D518 Other vitamin B12 deficiency anemias: Secondary | ICD-10-CM | POA: Diagnosis not present

## 2021-06-27 DIAGNOSIS — I69354 Hemiplegia and hemiparesis following cerebral infarction affecting left non-dominant side: Secondary | ICD-10-CM | POA: Diagnosis not present

## 2021-06-27 DIAGNOSIS — J449 Chronic obstructive pulmonary disease, unspecified: Secondary | ICD-10-CM | POA: Diagnosis not present

## 2021-06-28 DIAGNOSIS — I69322 Dysarthria following cerebral infarction: Secondary | ICD-10-CM | POA: Diagnosis not present

## 2021-06-28 DIAGNOSIS — I69828 Other speech and language deficits following other cerebrovascular disease: Secondary | ICD-10-CM | POA: Diagnosis not present

## 2021-06-28 DIAGNOSIS — I69314 Frontal lobe and executive function deficit following cerebral infarction: Secondary | ICD-10-CM | POA: Diagnosis not present

## 2021-06-28 DIAGNOSIS — I69398 Other sequelae of cerebral infarction: Secondary | ICD-10-CM | POA: Diagnosis not present

## 2021-06-28 DIAGNOSIS — M6281 Muscle weakness (generalized): Secondary | ICD-10-CM | POA: Diagnosis not present

## 2021-06-28 DIAGNOSIS — I509 Heart failure, unspecified: Secondary | ICD-10-CM | POA: Diagnosis not present

## 2021-06-28 DIAGNOSIS — R1312 Dysphagia, oropharyngeal phase: Secondary | ICD-10-CM | POA: Diagnosis not present

## 2021-06-28 DIAGNOSIS — I69391 Dysphagia following cerebral infarction: Secondary | ICD-10-CM | POA: Diagnosis not present

## 2021-06-28 DIAGNOSIS — I10 Essential (primary) hypertension: Secondary | ICD-10-CM | POA: Diagnosis not present

## 2021-06-28 DIAGNOSIS — I69354 Hemiplegia and hemiparesis following cerebral infarction affecting left non-dominant side: Secondary | ICD-10-CM | POA: Diagnosis not present

## 2021-06-30 DIAGNOSIS — I69398 Other sequelae of cerebral infarction: Secondary | ICD-10-CM | POA: Diagnosis not present

## 2021-06-30 DIAGNOSIS — I69314 Frontal lobe and executive function deficit following cerebral infarction: Secondary | ICD-10-CM | POA: Diagnosis not present

## 2021-06-30 DIAGNOSIS — I69322 Dysarthria following cerebral infarction: Secondary | ICD-10-CM | POA: Diagnosis not present

## 2021-06-30 DIAGNOSIS — I10 Essential (primary) hypertension: Secondary | ICD-10-CM | POA: Diagnosis not present

## 2021-06-30 DIAGNOSIS — I69391 Dysphagia following cerebral infarction: Secondary | ICD-10-CM | POA: Diagnosis not present

## 2021-06-30 DIAGNOSIS — I509 Heart failure, unspecified: Secondary | ICD-10-CM | POA: Diagnosis not present

## 2021-06-30 DIAGNOSIS — M6281 Muscle weakness (generalized): Secondary | ICD-10-CM | POA: Diagnosis not present

## 2021-06-30 DIAGNOSIS — R1312 Dysphagia, oropharyngeal phase: Secondary | ICD-10-CM | POA: Diagnosis not present

## 2021-06-30 DIAGNOSIS — I69828 Other speech and language deficits following other cerebrovascular disease: Secondary | ICD-10-CM | POA: Diagnosis not present

## 2021-06-30 DIAGNOSIS — I69354 Hemiplegia and hemiparesis following cerebral infarction affecting left non-dominant side: Secondary | ICD-10-CM | POA: Diagnosis not present

## 2021-07-02 DIAGNOSIS — I10 Essential (primary) hypertension: Secondary | ICD-10-CM | POA: Diagnosis not present

## 2021-07-02 DIAGNOSIS — I69828 Other speech and language deficits following other cerebrovascular disease: Secondary | ICD-10-CM | POA: Diagnosis not present

## 2021-07-02 DIAGNOSIS — I509 Heart failure, unspecified: Secondary | ICD-10-CM | POA: Diagnosis not present

## 2021-07-02 DIAGNOSIS — I69398 Other sequelae of cerebral infarction: Secondary | ICD-10-CM | POA: Diagnosis not present

## 2021-07-02 DIAGNOSIS — I69391 Dysphagia following cerebral infarction: Secondary | ICD-10-CM | POA: Diagnosis not present

## 2021-07-02 DIAGNOSIS — I69314 Frontal lobe and executive function deficit following cerebral infarction: Secondary | ICD-10-CM | POA: Diagnosis not present

## 2021-07-02 DIAGNOSIS — I69322 Dysarthria following cerebral infarction: Secondary | ICD-10-CM | POA: Diagnosis not present

## 2021-07-02 DIAGNOSIS — I69354 Hemiplegia and hemiparesis following cerebral infarction affecting left non-dominant side: Secondary | ICD-10-CM | POA: Diagnosis not present

## 2021-07-02 DIAGNOSIS — R1312 Dysphagia, oropharyngeal phase: Secondary | ICD-10-CM | POA: Diagnosis not present

## 2021-07-02 DIAGNOSIS — M6281 Muscle weakness (generalized): Secondary | ICD-10-CM | POA: Diagnosis not present

## 2021-07-05 DIAGNOSIS — I69314 Frontal lobe and executive function deficit following cerebral infarction: Secondary | ICD-10-CM | POA: Diagnosis not present

## 2021-07-05 DIAGNOSIS — M6281 Muscle weakness (generalized): Secondary | ICD-10-CM | POA: Diagnosis not present

## 2021-07-05 DIAGNOSIS — I69391 Dysphagia following cerebral infarction: Secondary | ICD-10-CM | POA: Diagnosis not present

## 2021-07-05 DIAGNOSIS — I69354 Hemiplegia and hemiparesis following cerebral infarction affecting left non-dominant side: Secondary | ICD-10-CM | POA: Diagnosis not present

## 2021-07-05 DIAGNOSIS — I69828 Other speech and language deficits following other cerebrovascular disease: Secondary | ICD-10-CM | POA: Diagnosis not present

## 2021-07-05 DIAGNOSIS — I509 Heart failure, unspecified: Secondary | ICD-10-CM | POA: Diagnosis not present

## 2021-07-05 DIAGNOSIS — I10 Essential (primary) hypertension: Secondary | ICD-10-CM | POA: Diagnosis not present

## 2021-07-05 DIAGNOSIS — I69398 Other sequelae of cerebral infarction: Secondary | ICD-10-CM | POA: Diagnosis not present

## 2021-07-05 DIAGNOSIS — I69322 Dysarthria following cerebral infarction: Secondary | ICD-10-CM | POA: Diagnosis not present

## 2021-07-05 DIAGNOSIS — R1312 Dysphagia, oropharyngeal phase: Secondary | ICD-10-CM | POA: Diagnosis not present

## 2021-07-06 DIAGNOSIS — I82409 Acute embolism and thrombosis of unspecified deep veins of unspecified lower extremity: Secondary | ICD-10-CM | POA: Diagnosis not present

## 2021-07-06 DIAGNOSIS — K649 Unspecified hemorrhoids: Secondary | ICD-10-CM | POA: Diagnosis not present

## 2021-07-06 DIAGNOSIS — I10 Essential (primary) hypertension: Secondary | ICD-10-CM | POA: Diagnosis not present

## 2021-07-06 DIAGNOSIS — I639 Cerebral infarction, unspecified: Secondary | ICD-10-CM | POA: Diagnosis not present

## 2021-07-06 DIAGNOSIS — I739 Peripheral vascular disease, unspecified: Secondary | ICD-10-CM | POA: Diagnosis not present

## 2021-07-07 DIAGNOSIS — R1312 Dysphagia, oropharyngeal phase: Secondary | ICD-10-CM | POA: Diagnosis not present

## 2021-07-07 DIAGNOSIS — I69828 Other speech and language deficits following other cerebrovascular disease: Secondary | ICD-10-CM | POA: Diagnosis not present

## 2021-07-07 DIAGNOSIS — I69391 Dysphagia following cerebral infarction: Secondary | ICD-10-CM | POA: Diagnosis not present

## 2021-07-07 DIAGNOSIS — I509 Heart failure, unspecified: Secondary | ICD-10-CM | POA: Diagnosis not present

## 2021-07-07 DIAGNOSIS — I69314 Frontal lobe and executive function deficit following cerebral infarction: Secondary | ICD-10-CM | POA: Diagnosis not present

## 2021-07-07 DIAGNOSIS — I10 Essential (primary) hypertension: Secondary | ICD-10-CM | POA: Diagnosis not present

## 2021-07-07 DIAGNOSIS — I69398 Other sequelae of cerebral infarction: Secondary | ICD-10-CM | POA: Diagnosis not present

## 2021-07-07 DIAGNOSIS — I69322 Dysarthria following cerebral infarction: Secondary | ICD-10-CM | POA: Diagnosis not present

## 2021-07-07 DIAGNOSIS — M6281 Muscle weakness (generalized): Secondary | ICD-10-CM | POA: Diagnosis not present

## 2021-07-07 DIAGNOSIS — I69354 Hemiplegia and hemiparesis following cerebral infarction affecting left non-dominant side: Secondary | ICD-10-CM | POA: Diagnosis not present

## 2021-07-08 DIAGNOSIS — I509 Heart failure, unspecified: Secondary | ICD-10-CM | POA: Diagnosis not present

## 2021-07-08 DIAGNOSIS — I69314 Frontal lobe and executive function deficit following cerebral infarction: Secondary | ICD-10-CM | POA: Diagnosis not present

## 2021-07-08 DIAGNOSIS — R1312 Dysphagia, oropharyngeal phase: Secondary | ICD-10-CM | POA: Diagnosis not present

## 2021-07-08 DIAGNOSIS — I69354 Hemiplegia and hemiparesis following cerebral infarction affecting left non-dominant side: Secondary | ICD-10-CM | POA: Diagnosis not present

## 2021-07-08 DIAGNOSIS — I69398 Other sequelae of cerebral infarction: Secondary | ICD-10-CM | POA: Diagnosis not present

## 2021-07-08 DIAGNOSIS — I69391 Dysphagia following cerebral infarction: Secondary | ICD-10-CM | POA: Diagnosis not present

## 2021-07-08 DIAGNOSIS — M6281 Muscle weakness (generalized): Secondary | ICD-10-CM | POA: Diagnosis not present

## 2021-07-08 DIAGNOSIS — I69828 Other speech and language deficits following other cerebrovascular disease: Secondary | ICD-10-CM | POA: Diagnosis not present

## 2021-07-08 DIAGNOSIS — I10 Essential (primary) hypertension: Secondary | ICD-10-CM | POA: Diagnosis not present

## 2021-07-08 DIAGNOSIS — I69322 Dysarthria following cerebral infarction: Secondary | ICD-10-CM | POA: Diagnosis not present

## 2021-07-12 DIAGNOSIS — I69314 Frontal lobe and executive function deficit following cerebral infarction: Secondary | ICD-10-CM | POA: Diagnosis not present

## 2021-07-12 DIAGNOSIS — I69398 Other sequelae of cerebral infarction: Secondary | ICD-10-CM | POA: Diagnosis not present

## 2021-07-12 DIAGNOSIS — R1312 Dysphagia, oropharyngeal phase: Secondary | ICD-10-CM | POA: Diagnosis not present

## 2021-07-12 DIAGNOSIS — I69354 Hemiplegia and hemiparesis following cerebral infarction affecting left non-dominant side: Secondary | ICD-10-CM | POA: Diagnosis not present

## 2021-07-12 DIAGNOSIS — I509 Heart failure, unspecified: Secondary | ICD-10-CM | POA: Diagnosis not present

## 2021-07-12 DIAGNOSIS — I69322 Dysarthria following cerebral infarction: Secondary | ICD-10-CM | POA: Diagnosis not present

## 2021-07-12 DIAGNOSIS — M6281 Muscle weakness (generalized): Secondary | ICD-10-CM | POA: Diagnosis not present

## 2021-07-12 DIAGNOSIS — I69391 Dysphagia following cerebral infarction: Secondary | ICD-10-CM | POA: Diagnosis not present

## 2021-07-12 DIAGNOSIS — I69828 Other speech and language deficits following other cerebrovascular disease: Secondary | ICD-10-CM | POA: Diagnosis not present

## 2021-07-12 DIAGNOSIS — I10 Essential (primary) hypertension: Secondary | ICD-10-CM | POA: Diagnosis not present

## 2021-07-13 DIAGNOSIS — I69314 Frontal lobe and executive function deficit following cerebral infarction: Secondary | ICD-10-CM | POA: Diagnosis not present

## 2021-07-13 DIAGNOSIS — I509 Heart failure, unspecified: Secondary | ICD-10-CM | POA: Diagnosis not present

## 2021-07-13 DIAGNOSIS — I10 Essential (primary) hypertension: Secondary | ICD-10-CM | POA: Diagnosis not present

## 2021-07-13 DIAGNOSIS — I69828 Other speech and language deficits following other cerebrovascular disease: Secondary | ICD-10-CM | POA: Diagnosis not present

## 2021-07-13 DIAGNOSIS — I69322 Dysarthria following cerebral infarction: Secondary | ICD-10-CM | POA: Diagnosis not present

## 2021-07-13 DIAGNOSIS — R1312 Dysphagia, oropharyngeal phase: Secondary | ICD-10-CM | POA: Diagnosis not present

## 2021-07-13 DIAGNOSIS — M6281 Muscle weakness (generalized): Secondary | ICD-10-CM | POA: Diagnosis not present

## 2021-07-13 DIAGNOSIS — I69391 Dysphagia following cerebral infarction: Secondary | ICD-10-CM | POA: Diagnosis not present

## 2021-07-13 DIAGNOSIS — I69354 Hemiplegia and hemiparesis following cerebral infarction affecting left non-dominant side: Secondary | ICD-10-CM | POA: Diagnosis not present

## 2021-07-13 DIAGNOSIS — I69398 Other sequelae of cerebral infarction: Secondary | ICD-10-CM | POA: Diagnosis not present

## 2021-07-14 DIAGNOSIS — I69322 Dysarthria following cerebral infarction: Secondary | ICD-10-CM | POA: Diagnosis not present

## 2021-07-14 DIAGNOSIS — R1312 Dysphagia, oropharyngeal phase: Secondary | ICD-10-CM | POA: Diagnosis not present

## 2021-07-14 DIAGNOSIS — I69828 Other speech and language deficits following other cerebrovascular disease: Secondary | ICD-10-CM | POA: Diagnosis not present

## 2021-07-14 DIAGNOSIS — M6281 Muscle weakness (generalized): Secondary | ICD-10-CM | POA: Diagnosis not present

## 2021-07-14 DIAGNOSIS — I69398 Other sequelae of cerebral infarction: Secondary | ICD-10-CM | POA: Diagnosis not present

## 2021-07-14 DIAGNOSIS — I509 Heart failure, unspecified: Secondary | ICD-10-CM | POA: Diagnosis not present

## 2021-07-14 DIAGNOSIS — I10 Essential (primary) hypertension: Secondary | ICD-10-CM | POA: Diagnosis not present

## 2021-07-14 DIAGNOSIS — I69354 Hemiplegia and hemiparesis following cerebral infarction affecting left non-dominant side: Secondary | ICD-10-CM | POA: Diagnosis not present

## 2021-07-14 DIAGNOSIS — I69314 Frontal lobe and executive function deficit following cerebral infarction: Secondary | ICD-10-CM | POA: Diagnosis not present

## 2021-07-14 DIAGNOSIS — I69391 Dysphagia following cerebral infarction: Secondary | ICD-10-CM | POA: Diagnosis not present

## 2021-07-15 DIAGNOSIS — I509 Heart failure, unspecified: Secondary | ICD-10-CM | POA: Diagnosis not present

## 2021-07-15 DIAGNOSIS — I69314 Frontal lobe and executive function deficit following cerebral infarction: Secondary | ICD-10-CM | POA: Diagnosis not present

## 2021-07-15 DIAGNOSIS — I69398 Other sequelae of cerebral infarction: Secondary | ICD-10-CM | POA: Diagnosis not present

## 2021-07-15 DIAGNOSIS — M6281 Muscle weakness (generalized): Secondary | ICD-10-CM | POA: Diagnosis not present

## 2021-07-15 DIAGNOSIS — I10 Essential (primary) hypertension: Secondary | ICD-10-CM | POA: Diagnosis not present

## 2021-07-15 DIAGNOSIS — I69322 Dysarthria following cerebral infarction: Secondary | ICD-10-CM | POA: Diagnosis not present

## 2021-07-15 DIAGNOSIS — I69391 Dysphagia following cerebral infarction: Secondary | ICD-10-CM | POA: Diagnosis not present

## 2021-07-15 DIAGNOSIS — I69354 Hemiplegia and hemiparesis following cerebral infarction affecting left non-dominant side: Secondary | ICD-10-CM | POA: Diagnosis not present

## 2021-07-15 DIAGNOSIS — I69828 Other speech and language deficits following other cerebrovascular disease: Secondary | ICD-10-CM | POA: Diagnosis not present

## 2021-07-15 DIAGNOSIS — R1312 Dysphagia, oropharyngeal phase: Secondary | ICD-10-CM | POA: Diagnosis not present

## 2021-07-16 DIAGNOSIS — R1312 Dysphagia, oropharyngeal phase: Secondary | ICD-10-CM | POA: Diagnosis not present

## 2021-07-16 DIAGNOSIS — I69828 Other speech and language deficits following other cerebrovascular disease: Secondary | ICD-10-CM | POA: Diagnosis not present

## 2021-07-16 DIAGNOSIS — M6281 Muscle weakness (generalized): Secondary | ICD-10-CM | POA: Diagnosis not present

## 2021-07-16 DIAGNOSIS — I69322 Dysarthria following cerebral infarction: Secondary | ICD-10-CM | POA: Diagnosis not present

## 2021-07-16 DIAGNOSIS — I69391 Dysphagia following cerebral infarction: Secondary | ICD-10-CM | POA: Diagnosis not present

## 2021-07-16 DIAGNOSIS — I69398 Other sequelae of cerebral infarction: Secondary | ICD-10-CM | POA: Diagnosis not present

## 2021-07-16 DIAGNOSIS — I69314 Frontal lobe and executive function deficit following cerebral infarction: Secondary | ICD-10-CM | POA: Diagnosis not present

## 2021-07-16 DIAGNOSIS — I509 Heart failure, unspecified: Secondary | ICD-10-CM | POA: Diagnosis not present

## 2021-07-16 DIAGNOSIS — I10 Essential (primary) hypertension: Secondary | ICD-10-CM | POA: Diagnosis not present

## 2021-07-16 DIAGNOSIS — I69354 Hemiplegia and hemiparesis following cerebral infarction affecting left non-dominant side: Secondary | ICD-10-CM | POA: Diagnosis not present

## 2021-07-19 DIAGNOSIS — I69322 Dysarthria following cerebral infarction: Secondary | ICD-10-CM | POA: Diagnosis not present

## 2021-07-19 DIAGNOSIS — R1312 Dysphagia, oropharyngeal phase: Secondary | ICD-10-CM | POA: Diagnosis not present

## 2021-07-19 DIAGNOSIS — I69314 Frontal lobe and executive function deficit following cerebral infarction: Secondary | ICD-10-CM | POA: Diagnosis not present

## 2021-07-19 DIAGNOSIS — I69828 Other speech and language deficits following other cerebrovascular disease: Secondary | ICD-10-CM | POA: Diagnosis not present

## 2021-07-19 DIAGNOSIS — I10 Essential (primary) hypertension: Secondary | ICD-10-CM | POA: Diagnosis not present

## 2021-07-19 DIAGNOSIS — I69354 Hemiplegia and hemiparesis following cerebral infarction affecting left non-dominant side: Secondary | ICD-10-CM | POA: Diagnosis not present

## 2021-07-19 DIAGNOSIS — I69391 Dysphagia following cerebral infarction: Secondary | ICD-10-CM | POA: Diagnosis not present

## 2021-07-19 DIAGNOSIS — I69398 Other sequelae of cerebral infarction: Secondary | ICD-10-CM | POA: Diagnosis not present

## 2021-07-19 DIAGNOSIS — I509 Heart failure, unspecified: Secondary | ICD-10-CM | POA: Diagnosis not present

## 2021-07-19 DIAGNOSIS — M6281 Muscle weakness (generalized): Secondary | ICD-10-CM | POA: Diagnosis not present

## 2021-07-20 DIAGNOSIS — I69398 Other sequelae of cerebral infarction: Secondary | ICD-10-CM | POA: Diagnosis not present

## 2021-07-20 DIAGNOSIS — I10 Essential (primary) hypertension: Secondary | ICD-10-CM | POA: Diagnosis not present

## 2021-07-20 DIAGNOSIS — I69828 Other speech and language deficits following other cerebrovascular disease: Secondary | ICD-10-CM | POA: Diagnosis not present

## 2021-07-20 DIAGNOSIS — I69354 Hemiplegia and hemiparesis following cerebral infarction affecting left non-dominant side: Secondary | ICD-10-CM | POA: Diagnosis not present

## 2021-07-20 DIAGNOSIS — R1312 Dysphagia, oropharyngeal phase: Secondary | ICD-10-CM | POA: Diagnosis not present

## 2021-07-20 DIAGNOSIS — M6281 Muscle weakness (generalized): Secondary | ICD-10-CM | POA: Diagnosis not present

## 2021-07-20 DIAGNOSIS — I509 Heart failure, unspecified: Secondary | ICD-10-CM | POA: Diagnosis not present

## 2021-07-20 DIAGNOSIS — I69322 Dysarthria following cerebral infarction: Secondary | ICD-10-CM | POA: Diagnosis not present

## 2021-07-20 DIAGNOSIS — I69314 Frontal lobe and executive function deficit following cerebral infarction: Secondary | ICD-10-CM | POA: Diagnosis not present

## 2021-07-20 DIAGNOSIS — I69391 Dysphagia following cerebral infarction: Secondary | ICD-10-CM | POA: Diagnosis not present

## 2021-07-21 DIAGNOSIS — I69828 Other speech and language deficits following other cerebrovascular disease: Secondary | ICD-10-CM | POA: Diagnosis not present

## 2021-07-21 DIAGNOSIS — I10 Essential (primary) hypertension: Secondary | ICD-10-CM | POA: Diagnosis not present

## 2021-07-21 DIAGNOSIS — I509 Heart failure, unspecified: Secondary | ICD-10-CM | POA: Diagnosis not present

## 2021-07-21 DIAGNOSIS — I69354 Hemiplegia and hemiparesis following cerebral infarction affecting left non-dominant side: Secondary | ICD-10-CM | POA: Diagnosis not present

## 2021-07-21 DIAGNOSIS — I69314 Frontal lobe and executive function deficit following cerebral infarction: Secondary | ICD-10-CM | POA: Diagnosis not present

## 2021-07-21 DIAGNOSIS — R1312 Dysphagia, oropharyngeal phase: Secondary | ICD-10-CM | POA: Diagnosis not present

## 2021-07-21 DIAGNOSIS — I69391 Dysphagia following cerebral infarction: Secondary | ICD-10-CM | POA: Diagnosis not present

## 2021-07-21 DIAGNOSIS — M6281 Muscle weakness (generalized): Secondary | ICD-10-CM | POA: Diagnosis not present

## 2021-07-21 DIAGNOSIS — I69398 Other sequelae of cerebral infarction: Secondary | ICD-10-CM | POA: Diagnosis not present

## 2021-07-21 DIAGNOSIS — I69322 Dysarthria following cerebral infarction: Secondary | ICD-10-CM | POA: Diagnosis not present

## 2021-07-22 DIAGNOSIS — I509 Heart failure, unspecified: Secondary | ICD-10-CM | POA: Diagnosis not present

## 2021-07-22 DIAGNOSIS — I69828 Other speech and language deficits following other cerebrovascular disease: Secondary | ICD-10-CM | POA: Diagnosis not present

## 2021-07-22 DIAGNOSIS — I69314 Frontal lobe and executive function deficit following cerebral infarction: Secondary | ICD-10-CM | POA: Diagnosis not present

## 2021-07-22 DIAGNOSIS — M6281 Muscle weakness (generalized): Secondary | ICD-10-CM | POA: Diagnosis not present

## 2021-07-22 DIAGNOSIS — I69322 Dysarthria following cerebral infarction: Secondary | ICD-10-CM | POA: Diagnosis not present

## 2021-07-22 DIAGNOSIS — I69354 Hemiplegia and hemiparesis following cerebral infarction affecting left non-dominant side: Secondary | ICD-10-CM | POA: Diagnosis not present

## 2021-07-22 DIAGNOSIS — I69398 Other sequelae of cerebral infarction: Secondary | ICD-10-CM | POA: Diagnosis not present

## 2021-07-22 DIAGNOSIS — R1312 Dysphagia, oropharyngeal phase: Secondary | ICD-10-CM | POA: Diagnosis not present

## 2021-07-22 DIAGNOSIS — I69391 Dysphagia following cerebral infarction: Secondary | ICD-10-CM | POA: Diagnosis not present

## 2021-07-22 DIAGNOSIS — I10 Essential (primary) hypertension: Secondary | ICD-10-CM | POA: Diagnosis not present

## 2021-07-23 DIAGNOSIS — I509 Heart failure, unspecified: Secondary | ICD-10-CM | POA: Diagnosis not present

## 2021-07-23 DIAGNOSIS — I69398 Other sequelae of cerebral infarction: Secondary | ICD-10-CM | POA: Diagnosis not present

## 2021-07-23 DIAGNOSIS — I69314 Frontal lobe and executive function deficit following cerebral infarction: Secondary | ICD-10-CM | POA: Diagnosis not present

## 2021-07-23 DIAGNOSIS — I69354 Hemiplegia and hemiparesis following cerebral infarction affecting left non-dominant side: Secondary | ICD-10-CM | POA: Diagnosis not present

## 2021-07-23 DIAGNOSIS — I69391 Dysphagia following cerebral infarction: Secondary | ICD-10-CM | POA: Diagnosis not present

## 2021-07-23 DIAGNOSIS — M6281 Muscle weakness (generalized): Secondary | ICD-10-CM | POA: Diagnosis not present

## 2021-07-23 DIAGNOSIS — R1312 Dysphagia, oropharyngeal phase: Secondary | ICD-10-CM | POA: Diagnosis not present

## 2021-07-23 DIAGNOSIS — I10 Essential (primary) hypertension: Secondary | ICD-10-CM | POA: Diagnosis not present

## 2021-07-23 DIAGNOSIS — I69322 Dysarthria following cerebral infarction: Secondary | ICD-10-CM | POA: Diagnosis not present

## 2021-07-23 DIAGNOSIS — I69828 Other speech and language deficits following other cerebrovascular disease: Secondary | ICD-10-CM | POA: Diagnosis not present

## 2021-07-24 DIAGNOSIS — I219 Acute myocardial infarction, unspecified: Secondary | ICD-10-CM | POA: Diagnosis not present

## 2021-07-24 DIAGNOSIS — D518 Other vitamin B12 deficiency anemias: Secondary | ICD-10-CM | POA: Diagnosis not present

## 2021-07-24 DIAGNOSIS — E119 Type 2 diabetes mellitus without complications: Secondary | ICD-10-CM | POA: Diagnosis not present

## 2021-07-24 DIAGNOSIS — E559 Vitamin D deficiency, unspecified: Secondary | ICD-10-CM | POA: Diagnosis not present

## 2021-07-24 DIAGNOSIS — I639 Cerebral infarction, unspecified: Secondary | ICD-10-CM | POA: Diagnosis not present

## 2021-07-24 DIAGNOSIS — I10 Essential (primary) hypertension: Secondary | ICD-10-CM | POA: Diagnosis not present

## 2021-07-24 DIAGNOSIS — E038 Other specified hypothyroidism: Secondary | ICD-10-CM | POA: Diagnosis not present

## 2021-07-26 DIAGNOSIS — I69828 Other speech and language deficits following other cerebrovascular disease: Secondary | ICD-10-CM | POA: Diagnosis not present

## 2021-07-26 DIAGNOSIS — I509 Heart failure, unspecified: Secondary | ICD-10-CM | POA: Diagnosis not present

## 2021-07-26 DIAGNOSIS — I69322 Dysarthria following cerebral infarction: Secondary | ICD-10-CM | POA: Diagnosis not present

## 2021-07-26 DIAGNOSIS — M6281 Muscle weakness (generalized): Secondary | ICD-10-CM | POA: Diagnosis not present

## 2021-07-26 DIAGNOSIS — I10 Essential (primary) hypertension: Secondary | ICD-10-CM | POA: Diagnosis not present

## 2021-07-26 DIAGNOSIS — R1312 Dysphagia, oropharyngeal phase: Secondary | ICD-10-CM | POA: Diagnosis not present

## 2021-07-26 DIAGNOSIS — I69354 Hemiplegia and hemiparesis following cerebral infarction affecting left non-dominant side: Secondary | ICD-10-CM | POA: Diagnosis not present

## 2021-07-26 DIAGNOSIS — I69391 Dysphagia following cerebral infarction: Secondary | ICD-10-CM | POA: Diagnosis not present

## 2021-07-26 DIAGNOSIS — I69398 Other sequelae of cerebral infarction: Secondary | ICD-10-CM | POA: Diagnosis not present

## 2021-07-26 DIAGNOSIS — I69314 Frontal lobe and executive function deficit following cerebral infarction: Secondary | ICD-10-CM | POA: Diagnosis not present

## 2021-07-27 DIAGNOSIS — I2102 ST elevation (STEMI) myocardial infarction involving left anterior descending coronary artery: Secondary | ICD-10-CM | POA: Diagnosis not present

## 2021-07-27 DIAGNOSIS — I69354 Hemiplegia and hemiparesis following cerebral infarction affecting left non-dominant side: Secondary | ICD-10-CM | POA: Diagnosis not present

## 2021-07-27 DIAGNOSIS — I25118 Atherosclerotic heart disease of native coronary artery with other forms of angina pectoris: Secondary | ICD-10-CM | POA: Diagnosis not present

## 2021-07-27 DIAGNOSIS — Z8679 Personal history of other diseases of the circulatory system: Secondary | ICD-10-CM | POA: Diagnosis not present

## 2021-07-27 DIAGNOSIS — E782 Mixed hyperlipidemia: Secondary | ICD-10-CM | POA: Diagnosis not present

## 2021-07-27 DIAGNOSIS — I5043 Acute on chronic combined systolic (congestive) and diastolic (congestive) heart failure: Secondary | ICD-10-CM | POA: Diagnosis not present

## 2021-07-27 DIAGNOSIS — R7303 Prediabetes: Secondary | ICD-10-CM | POA: Diagnosis not present

## 2021-07-27 DIAGNOSIS — I10 Essential (primary) hypertension: Secondary | ICD-10-CM | POA: Diagnosis not present

## 2021-07-27 DIAGNOSIS — J449 Chronic obstructive pulmonary disease, unspecified: Secondary | ICD-10-CM | POA: Diagnosis not present

## 2021-07-27 DIAGNOSIS — I6930 Unspecified sequelae of cerebral infarction: Secondary | ICD-10-CM | POA: Diagnosis not present

## 2021-07-27 DIAGNOSIS — Z955 Presence of coronary angioplasty implant and graft: Secondary | ICD-10-CM | POA: Diagnosis not present

## 2021-07-28 DIAGNOSIS — I69354 Hemiplegia and hemiparesis following cerebral infarction affecting left non-dominant side: Secondary | ICD-10-CM | POA: Diagnosis not present

## 2021-07-28 DIAGNOSIS — M6281 Muscle weakness (generalized): Secondary | ICD-10-CM | POA: Diagnosis not present

## 2021-07-28 DIAGNOSIS — I10 Essential (primary) hypertension: Secondary | ICD-10-CM | POA: Diagnosis not present

## 2021-07-28 DIAGNOSIS — R1312 Dysphagia, oropharyngeal phase: Secondary | ICD-10-CM | POA: Diagnosis not present

## 2021-07-28 DIAGNOSIS — I69351 Hemiplegia and hemiparesis following cerebral infarction affecting right dominant side: Secondary | ICD-10-CM | POA: Diagnosis not present

## 2021-07-28 DIAGNOSIS — I69391 Dysphagia following cerebral infarction: Secondary | ICD-10-CM | POA: Diagnosis not present

## 2021-07-28 DIAGNOSIS — I69013 Psychomotor deficit following nontraumatic subarachnoid hemorrhage: Secondary | ICD-10-CM | POA: Diagnosis not present

## 2021-07-28 DIAGNOSIS — I69398 Other sequelae of cerebral infarction: Secondary | ICD-10-CM | POA: Diagnosis not present

## 2021-07-28 DIAGNOSIS — I509 Heart failure, unspecified: Secondary | ICD-10-CM | POA: Diagnosis not present

## 2021-07-28 DIAGNOSIS — L89152 Pressure ulcer of sacral region, stage 2: Secondary | ICD-10-CM | POA: Diagnosis not present

## 2021-07-29 DIAGNOSIS — I69351 Hemiplegia and hemiparesis following cerebral infarction affecting right dominant side: Secondary | ICD-10-CM | POA: Diagnosis not present

## 2021-07-29 DIAGNOSIS — I69013 Psychomotor deficit following nontraumatic subarachnoid hemorrhage: Secondary | ICD-10-CM | POA: Diagnosis not present

## 2021-07-29 DIAGNOSIS — I69354 Hemiplegia and hemiparesis following cerebral infarction affecting left non-dominant side: Secondary | ICD-10-CM | POA: Diagnosis not present

## 2021-07-29 DIAGNOSIS — R1312 Dysphagia, oropharyngeal phase: Secondary | ICD-10-CM | POA: Diagnosis not present

## 2021-07-29 DIAGNOSIS — I10 Essential (primary) hypertension: Secondary | ICD-10-CM | POA: Diagnosis not present

## 2021-07-29 DIAGNOSIS — I509 Heart failure, unspecified: Secondary | ICD-10-CM | POA: Diagnosis not present

## 2021-07-29 DIAGNOSIS — L89152 Pressure ulcer of sacral region, stage 2: Secondary | ICD-10-CM | POA: Diagnosis not present

## 2021-07-29 DIAGNOSIS — I69398 Other sequelae of cerebral infarction: Secondary | ICD-10-CM | POA: Diagnosis not present

## 2021-07-29 DIAGNOSIS — I69391 Dysphagia following cerebral infarction: Secondary | ICD-10-CM | POA: Diagnosis not present

## 2021-07-29 DIAGNOSIS — M6281 Muscle weakness (generalized): Secondary | ICD-10-CM | POA: Diagnosis not present

## 2021-07-30 DIAGNOSIS — I69398 Other sequelae of cerebral infarction: Secondary | ICD-10-CM | POA: Diagnosis not present

## 2021-07-30 DIAGNOSIS — I10 Essential (primary) hypertension: Secondary | ICD-10-CM | POA: Diagnosis not present

## 2021-07-30 DIAGNOSIS — R1312 Dysphagia, oropharyngeal phase: Secondary | ICD-10-CM | POA: Diagnosis not present

## 2021-07-30 DIAGNOSIS — I509 Heart failure, unspecified: Secondary | ICD-10-CM | POA: Diagnosis not present

## 2021-07-30 DIAGNOSIS — I69351 Hemiplegia and hemiparesis following cerebral infarction affecting right dominant side: Secondary | ICD-10-CM | POA: Diagnosis not present

## 2021-07-30 DIAGNOSIS — M6281 Muscle weakness (generalized): Secondary | ICD-10-CM | POA: Diagnosis not present

## 2021-07-30 DIAGNOSIS — L89152 Pressure ulcer of sacral region, stage 2: Secondary | ICD-10-CM | POA: Diagnosis not present

## 2021-07-30 DIAGNOSIS — I69354 Hemiplegia and hemiparesis following cerebral infarction affecting left non-dominant side: Secondary | ICD-10-CM | POA: Diagnosis not present

## 2021-07-30 DIAGNOSIS — I69391 Dysphagia following cerebral infarction: Secondary | ICD-10-CM | POA: Diagnosis not present

## 2021-07-30 DIAGNOSIS — I69013 Psychomotor deficit following nontraumatic subarachnoid hemorrhage: Secondary | ICD-10-CM | POA: Diagnosis not present

## 2021-08-02 DIAGNOSIS — I69398 Other sequelae of cerebral infarction: Secondary | ICD-10-CM | POA: Diagnosis not present

## 2021-08-02 DIAGNOSIS — I69391 Dysphagia following cerebral infarction: Secondary | ICD-10-CM | POA: Diagnosis not present

## 2021-08-02 DIAGNOSIS — L89152 Pressure ulcer of sacral region, stage 2: Secondary | ICD-10-CM | POA: Diagnosis not present

## 2021-08-02 DIAGNOSIS — I69354 Hemiplegia and hemiparesis following cerebral infarction affecting left non-dominant side: Secondary | ICD-10-CM | POA: Diagnosis not present

## 2021-08-02 DIAGNOSIS — I10 Essential (primary) hypertension: Secondary | ICD-10-CM | POA: Diagnosis not present

## 2021-08-02 DIAGNOSIS — R1312 Dysphagia, oropharyngeal phase: Secondary | ICD-10-CM | POA: Diagnosis not present

## 2021-08-02 DIAGNOSIS — I69351 Hemiplegia and hemiparesis following cerebral infarction affecting right dominant side: Secondary | ICD-10-CM | POA: Diagnosis not present

## 2021-08-02 DIAGNOSIS — I509 Heart failure, unspecified: Secondary | ICD-10-CM | POA: Diagnosis not present

## 2021-08-02 DIAGNOSIS — I69013 Psychomotor deficit following nontraumatic subarachnoid hemorrhage: Secondary | ICD-10-CM | POA: Diagnosis not present

## 2021-08-02 DIAGNOSIS — M6281 Muscle weakness (generalized): Secondary | ICD-10-CM | POA: Diagnosis not present

## 2021-08-03 DIAGNOSIS — N1831 Chronic kidney disease, stage 3a: Secondary | ICD-10-CM | POA: Diagnosis not present

## 2021-08-03 DIAGNOSIS — I739 Peripheral vascular disease, unspecified: Secondary | ICD-10-CM | POA: Diagnosis not present

## 2021-08-03 DIAGNOSIS — I639 Cerebral infarction, unspecified: Secondary | ICD-10-CM | POA: Diagnosis not present

## 2021-08-03 DIAGNOSIS — I10 Essential (primary) hypertension: Secondary | ICD-10-CM | POA: Diagnosis not present

## 2021-08-03 DIAGNOSIS — I82409 Acute embolism and thrombosis of unspecified deep veins of unspecified lower extremity: Secondary | ICD-10-CM | POA: Diagnosis not present

## 2021-08-03 DIAGNOSIS — K219 Gastro-esophageal reflux disease without esophagitis: Secondary | ICD-10-CM | POA: Diagnosis not present

## 2021-08-04 DIAGNOSIS — I69398 Other sequelae of cerebral infarction: Secondary | ICD-10-CM | POA: Diagnosis not present

## 2021-08-04 DIAGNOSIS — M6281 Muscle weakness (generalized): Secondary | ICD-10-CM | POA: Diagnosis not present

## 2021-08-04 DIAGNOSIS — L89152 Pressure ulcer of sacral region, stage 2: Secondary | ICD-10-CM | POA: Diagnosis not present

## 2021-08-04 DIAGNOSIS — I69351 Hemiplegia and hemiparesis following cerebral infarction affecting right dominant side: Secondary | ICD-10-CM | POA: Diagnosis not present

## 2021-08-04 DIAGNOSIS — I10 Essential (primary) hypertension: Secondary | ICD-10-CM | POA: Diagnosis not present

## 2021-08-04 DIAGNOSIS — I69354 Hemiplegia and hemiparesis following cerebral infarction affecting left non-dominant side: Secondary | ICD-10-CM | POA: Diagnosis not present

## 2021-08-04 DIAGNOSIS — I509 Heart failure, unspecified: Secondary | ICD-10-CM | POA: Diagnosis not present

## 2021-08-04 DIAGNOSIS — I69013 Psychomotor deficit following nontraumatic subarachnoid hemorrhage: Secondary | ICD-10-CM | POA: Diagnosis not present

## 2021-08-04 DIAGNOSIS — I69391 Dysphagia following cerebral infarction: Secondary | ICD-10-CM | POA: Diagnosis not present

## 2021-08-04 DIAGNOSIS — R1312 Dysphagia, oropharyngeal phase: Secondary | ICD-10-CM | POA: Diagnosis not present

## 2021-08-05 DIAGNOSIS — M6281 Muscle weakness (generalized): Secondary | ICD-10-CM | POA: Diagnosis not present

## 2021-08-05 DIAGNOSIS — I509 Heart failure, unspecified: Secondary | ICD-10-CM | POA: Diagnosis not present

## 2021-08-05 DIAGNOSIS — I69398 Other sequelae of cerebral infarction: Secondary | ICD-10-CM | POA: Diagnosis not present

## 2021-08-05 DIAGNOSIS — I10 Essential (primary) hypertension: Secondary | ICD-10-CM | POA: Diagnosis not present

## 2021-08-05 DIAGNOSIS — R1312 Dysphagia, oropharyngeal phase: Secondary | ICD-10-CM | POA: Diagnosis not present

## 2021-08-05 DIAGNOSIS — I69351 Hemiplegia and hemiparesis following cerebral infarction affecting right dominant side: Secondary | ICD-10-CM | POA: Diagnosis not present

## 2021-08-05 DIAGNOSIS — L89152 Pressure ulcer of sacral region, stage 2: Secondary | ICD-10-CM | POA: Diagnosis not present

## 2021-08-05 DIAGNOSIS — I69013 Psychomotor deficit following nontraumatic subarachnoid hemorrhage: Secondary | ICD-10-CM | POA: Diagnosis not present

## 2021-08-05 DIAGNOSIS — I69391 Dysphagia following cerebral infarction: Secondary | ICD-10-CM | POA: Diagnosis not present

## 2021-08-05 DIAGNOSIS — I69354 Hemiplegia and hemiparesis following cerebral infarction affecting left non-dominant side: Secondary | ICD-10-CM | POA: Diagnosis not present

## 2021-08-06 DIAGNOSIS — M6281 Muscle weakness (generalized): Secondary | ICD-10-CM | POA: Diagnosis not present

## 2021-08-06 DIAGNOSIS — I10 Essential (primary) hypertension: Secondary | ICD-10-CM | POA: Diagnosis not present

## 2021-08-06 DIAGNOSIS — R1312 Dysphagia, oropharyngeal phase: Secondary | ICD-10-CM | POA: Diagnosis not present

## 2021-08-06 DIAGNOSIS — I69351 Hemiplegia and hemiparesis following cerebral infarction affecting right dominant side: Secondary | ICD-10-CM | POA: Diagnosis not present

## 2021-08-06 DIAGNOSIS — I69354 Hemiplegia and hemiparesis following cerebral infarction affecting left non-dominant side: Secondary | ICD-10-CM | POA: Diagnosis not present

## 2021-08-06 DIAGNOSIS — I69013 Psychomotor deficit following nontraumatic subarachnoid hemorrhage: Secondary | ICD-10-CM | POA: Diagnosis not present

## 2021-08-06 DIAGNOSIS — I509 Heart failure, unspecified: Secondary | ICD-10-CM | POA: Diagnosis not present

## 2021-08-06 DIAGNOSIS — L89152 Pressure ulcer of sacral region, stage 2: Secondary | ICD-10-CM | POA: Diagnosis not present

## 2021-08-06 DIAGNOSIS — I69398 Other sequelae of cerebral infarction: Secondary | ICD-10-CM | POA: Diagnosis not present

## 2021-08-06 DIAGNOSIS — I69391 Dysphagia following cerebral infarction: Secondary | ICD-10-CM | POA: Diagnosis not present

## 2021-08-07 DIAGNOSIS — I10 Essential (primary) hypertension: Secondary | ICD-10-CM | POA: Diagnosis not present

## 2021-08-10 DIAGNOSIS — I509 Heart failure, unspecified: Secondary | ICD-10-CM | POA: Diagnosis not present

## 2021-08-10 DIAGNOSIS — I69398 Other sequelae of cerebral infarction: Secondary | ICD-10-CM | POA: Diagnosis not present

## 2021-08-10 DIAGNOSIS — R1312 Dysphagia, oropharyngeal phase: Secondary | ICD-10-CM | POA: Diagnosis not present

## 2021-08-10 DIAGNOSIS — I69013 Psychomotor deficit following nontraumatic subarachnoid hemorrhage: Secondary | ICD-10-CM | POA: Diagnosis not present

## 2021-08-10 DIAGNOSIS — I10 Essential (primary) hypertension: Secondary | ICD-10-CM | POA: Diagnosis not present

## 2021-08-10 DIAGNOSIS — L89152 Pressure ulcer of sacral region, stage 2: Secondary | ICD-10-CM | POA: Diagnosis not present

## 2021-08-10 DIAGNOSIS — I69354 Hemiplegia and hemiparesis following cerebral infarction affecting left non-dominant side: Secondary | ICD-10-CM | POA: Diagnosis not present

## 2021-08-10 DIAGNOSIS — I69351 Hemiplegia and hemiparesis following cerebral infarction affecting right dominant side: Secondary | ICD-10-CM | POA: Diagnosis not present

## 2021-08-10 DIAGNOSIS — I69391 Dysphagia following cerebral infarction: Secondary | ICD-10-CM | POA: Diagnosis not present

## 2021-08-10 DIAGNOSIS — M6281 Muscle weakness (generalized): Secondary | ICD-10-CM | POA: Diagnosis not present

## 2021-08-11 DIAGNOSIS — M6281 Muscle weakness (generalized): Secondary | ICD-10-CM | POA: Diagnosis not present

## 2021-08-11 DIAGNOSIS — I509 Heart failure, unspecified: Secondary | ICD-10-CM | POA: Diagnosis not present

## 2021-08-11 DIAGNOSIS — I69354 Hemiplegia and hemiparesis following cerebral infarction affecting left non-dominant side: Secondary | ICD-10-CM | POA: Diagnosis not present

## 2021-08-11 DIAGNOSIS — I69398 Other sequelae of cerebral infarction: Secondary | ICD-10-CM | POA: Diagnosis not present

## 2021-08-11 DIAGNOSIS — I69351 Hemiplegia and hemiparesis following cerebral infarction affecting right dominant side: Secondary | ICD-10-CM | POA: Diagnosis not present

## 2021-08-11 DIAGNOSIS — L89152 Pressure ulcer of sacral region, stage 2: Secondary | ICD-10-CM | POA: Diagnosis not present

## 2021-08-11 DIAGNOSIS — R1312 Dysphagia, oropharyngeal phase: Secondary | ICD-10-CM | POA: Diagnosis not present

## 2021-08-11 DIAGNOSIS — I69391 Dysphagia following cerebral infarction: Secondary | ICD-10-CM | POA: Diagnosis not present

## 2021-08-11 DIAGNOSIS — I69013 Psychomotor deficit following nontraumatic subarachnoid hemorrhage: Secondary | ICD-10-CM | POA: Diagnosis not present

## 2021-08-11 DIAGNOSIS — I10 Essential (primary) hypertension: Secondary | ICD-10-CM | POA: Diagnosis not present

## 2021-08-12 DIAGNOSIS — M6281 Muscle weakness (generalized): Secondary | ICD-10-CM | POA: Diagnosis not present

## 2021-08-12 DIAGNOSIS — I509 Heart failure, unspecified: Secondary | ICD-10-CM | POA: Diagnosis not present

## 2021-08-12 DIAGNOSIS — I69013 Psychomotor deficit following nontraumatic subarachnoid hemorrhage: Secondary | ICD-10-CM | POA: Diagnosis not present

## 2021-08-12 DIAGNOSIS — I69398 Other sequelae of cerebral infarction: Secondary | ICD-10-CM | POA: Diagnosis not present

## 2021-08-12 DIAGNOSIS — L89152 Pressure ulcer of sacral region, stage 2: Secondary | ICD-10-CM | POA: Diagnosis not present

## 2021-08-12 DIAGNOSIS — I69391 Dysphagia following cerebral infarction: Secondary | ICD-10-CM | POA: Diagnosis not present

## 2021-08-12 DIAGNOSIS — R1312 Dysphagia, oropharyngeal phase: Secondary | ICD-10-CM | POA: Diagnosis not present

## 2021-08-12 DIAGNOSIS — I69351 Hemiplegia and hemiparesis following cerebral infarction affecting right dominant side: Secondary | ICD-10-CM | POA: Diagnosis not present

## 2021-08-12 DIAGNOSIS — I10 Essential (primary) hypertension: Secondary | ICD-10-CM | POA: Diagnosis not present

## 2021-08-12 DIAGNOSIS — I69354 Hemiplegia and hemiparesis following cerebral infarction affecting left non-dominant side: Secondary | ICD-10-CM | POA: Diagnosis not present

## 2021-08-16 ENCOUNTER — Emergency Department
Admission: EM | Admit: 2021-08-16 | Discharge: 2021-08-16 | Disposition: A | Discharge status: Home / Self Care | Payer: Medicare Other | Attending: Emergency Medicine | Admitting: Emergency Medicine

## 2021-08-16 ENCOUNTER — Other Ambulatory Visit: Payer: Self-pay

## 2021-08-16 ENCOUNTER — Emergency Department: Payer: Medicare Other

## 2021-08-16 DIAGNOSIS — R1312 Dysphagia, oropharyngeal phase: Secondary | ICD-10-CM | POA: Diagnosis not present

## 2021-08-16 DIAGNOSIS — R109 Unspecified abdominal pain: Secondary | ICD-10-CM | POA: Diagnosis not present

## 2021-08-16 DIAGNOSIS — I69354 Hemiplegia and hemiparesis following cerebral infarction affecting left non-dominant side: Secondary | ICD-10-CM | POA: Diagnosis not present

## 2021-08-16 DIAGNOSIS — R309 Painful micturition, unspecified: Secondary | ICD-10-CM | POA: Diagnosis not present

## 2021-08-16 DIAGNOSIS — R319 Hematuria, unspecified: Secondary | ICD-10-CM

## 2021-08-16 DIAGNOSIS — L89152 Pressure ulcer of sacral region, stage 2: Secondary | ICD-10-CM | POA: Diagnosis not present

## 2021-08-16 DIAGNOSIS — N3289 Other specified disorders of bladder: Secondary | ICD-10-CM

## 2021-08-16 DIAGNOSIS — I69391 Dysphagia following cerebral infarction: Secondary | ICD-10-CM | POA: Diagnosis not present

## 2021-08-16 DIAGNOSIS — N329 Bladder disorder, unspecified: Secondary | ICD-10-CM | POA: Diagnosis not present

## 2021-08-16 DIAGNOSIS — R310 Gross hematuria: Secondary | ICD-10-CM

## 2021-08-16 DIAGNOSIS — N3091 Cystitis, unspecified with hematuria: Secondary | ICD-10-CM

## 2021-08-16 DIAGNOSIS — I509 Heart failure, unspecified: Secondary | ICD-10-CM | POA: Diagnosis not present

## 2021-08-16 DIAGNOSIS — Z7901 Long term (current) use of anticoagulants: Secondary | ICD-10-CM | POA: Diagnosis not present

## 2021-08-16 DIAGNOSIS — I10 Essential (primary) hypertension: Secondary | ICD-10-CM | POA: Diagnosis not present

## 2021-08-16 DIAGNOSIS — C679 Malignant neoplasm of bladder, unspecified: Secondary | ICD-10-CM | POA: Diagnosis not present

## 2021-08-16 DIAGNOSIS — R300 Dysuria: Secondary | ICD-10-CM | POA: Diagnosis not present

## 2021-08-16 DIAGNOSIS — I69013 Psychomotor deficit following nontraumatic subarachnoid hemorrhage: Secondary | ICD-10-CM | POA: Diagnosis not present

## 2021-08-16 DIAGNOSIS — I69351 Hemiplegia and hemiparesis following cerebral infarction affecting right dominant side: Secondary | ICD-10-CM | POA: Diagnosis not present

## 2021-08-16 DIAGNOSIS — M6281 Muscle weakness (generalized): Secondary | ICD-10-CM | POA: Diagnosis not present

## 2021-08-16 DIAGNOSIS — Z743 Need for continuous supervision: Secondary | ICD-10-CM | POA: Diagnosis not present

## 2021-08-16 DIAGNOSIS — I69398 Other sequelae of cerebral infarction: Secondary | ICD-10-CM | POA: Diagnosis not present

## 2021-08-16 DIAGNOSIS — N281 Cyst of kidney, acquired: Secondary | ICD-10-CM | POA: Diagnosis not present

## 2021-08-16 HISTORY — DX: Encephalopathy, unspecified: G93.40

## 2021-08-16 HISTORY — DX: Atherosclerotic heart disease of native coronary artery without angina pectoris: I25.10

## 2021-08-16 HISTORY — DX: Benign prostatic hyperplasia without lower urinary tract symptoms: N40.0

## 2021-08-16 HISTORY — DX: Acute myocardial infarction, unspecified: I21.9

## 2021-08-16 HISTORY — DX: Other forms of angina pectoris: I20.8

## 2021-08-16 HISTORY — DX: Cerebral infarction, unspecified: I63.9

## 2021-08-16 HISTORY — DX: Essential (primary) hypertension: I10

## 2021-08-16 HISTORY — DX: Hyperlipidemia, unspecified: E78.5

## 2021-08-16 HISTORY — DX: Hemiplegia, unspecified affecting unspecified side: G81.90

## 2021-08-16 HISTORY — DX: Other forms of angina pectoris: I20.89

## 2021-08-16 LAB — CBC
HCT: 36.9 % — ABNORMAL LOW (ref 39.0–52.0)
Hemoglobin: 11.8 g/dL — ABNORMAL LOW (ref 13.0–17.0)
MCH: 30 pg (ref 26.0–34.0)
MCHC: 32 g/dL (ref 30.0–36.0)
MCV: 93.9 fL (ref 80.0–100.0)
Platelets: 181 10*3/uL (ref 150–400)
RBC: 3.93 MIL/uL — ABNORMAL LOW (ref 4.22–5.81)
RDW: 13.6 % (ref 11.5–15.5)
WBC: 3.1 10*3/uL — ABNORMAL LOW (ref 4.0–10.5)
nRBC: 0 % (ref 0.0–0.2)

## 2021-08-16 LAB — COMPREHENSIVE METABOLIC PANEL
ALT: 31 U/L (ref 0–44)
AST: 26 U/L (ref 15–41)
Albumin: 4.1 g/dL (ref 3.5–5.0)
Alkaline Phosphatase: 44 U/L (ref 38–126)
Anion gap: 5 (ref 5–15)
BUN: 20 mg/dL (ref 8–23)
CO2: 29 mmol/L (ref 22–32)
Calcium: 9.3 mg/dL (ref 8.9–10.3)
Chloride: 106 mmol/L (ref 98–111)
Creatinine, Ser: 1.2 mg/dL (ref 0.61–1.24)
GFR, Estimated: 60 mL/min — ABNORMAL LOW (ref >60)
Glucose, Bld: 86 mg/dL (ref 70–99)
Potassium: 3.5 mmol/L (ref 3.5–5.1)
Sodium: 140 mmol/L (ref 135–145)
Total Bilirubin: 0.7 mg/dL (ref 0.3–1.2)
Total Protein: 7.3 g/dL (ref 6.5–8.1)

## 2021-08-16 LAB — URINALYSIS, ROUTINE W REFLEX MICROSCOPIC
RBC / HPF: >50 RBC/hpf — ABNORMAL HIGH (ref 0–5)
Specific Gravity, Urine: 1.019 (ref 1.005–1.030)
Squamous Epithelial / HPF: NONE SEEN (ref 0–5)
WBC, UA: >50 WBC/hpf — ABNORMAL HIGH (ref 0–5)

## 2021-08-16 LAB — LIPASE, BLOOD: Lipase: 57 U/L — ABNORMAL HIGH (ref 11–51)

## 2021-08-16 MED ORDER — SODIUM CHLORIDE 0.9 % IV SOLN
2.0000 g | Freq: Once | INTRAVENOUS | Status: AC
  Administered 2021-08-16: 2 g via INTRAVENOUS
  Filled 2021-08-16: qty 20

## 2021-08-16 MED ORDER — SODIUM CHLORIDE 0.9 % IV BOLUS
500.0000 mL | Freq: Once | INTRAVENOUS | Status: AC
  Administered 2021-08-16: 500 mL via INTRAVENOUS

## 2021-08-16 MED ORDER — CEFDINIR 300 MG PO CAPS: 300.0000 mg | ORAL_CAPSULE | Freq: Two times a day (BID) | ORAL | 0 refills | Status: AC

## 2021-08-16 MED ORDER — CEFDINIR 300 MG PO CAPS: 300.0000 mg | ORAL_CAPSULE | Freq: Two times a day (BID) | ORAL | 0 refills | Status: DC

## 2021-08-16 MED ORDER — ACETAMINOPHEN 500 MG PO TABS: 1000.0000 mg | ORAL_TABLET | Freq: Once | ORAL | Status: AC

## 2021-08-16 MED ORDER — ACETAMINOPHEN 500 MG PO TABS
ORAL_TABLET | ORAL | Status: AC
  Administered 2021-08-16: 1000 mg via ORAL
  Filled 2021-08-16: qty 2

## 2021-08-16 NOTE — ED Triage Notes (Signed)
Pt comes into the ED via EMS from [facility] with c/o painful urination with abd pain and bloody urine this morning  130/75 HR70 99%Ra

## 2021-08-16 NOTE — ED Notes (Addendum)
Pt assisted to bedside commode and was able to void 300 mls. Urine bloody: consistency still urine-like. Pt had post bladder scan 71 mls. Rush Landmark, RN made aware.

## 2021-08-16 NOTE — Consult Note (Signed)
Urology Consult  I have been asked to see the patient by Dr. Jari Pigg, for evaluation and management of gross hematuria.  Chief Complaint: Gross hematuria, dysuria  History of Present Illness: Hunter Bailey is a 87 y.o. year old male with PMH CAD, CVA, NSTEMI in March 2023 on DAPT with Plavix and aspirin who presented to the ED via EMS from the [facility] with reports of sudden onset gross hematuria and dysuria this morning.  Admission labs notable for UA with red, turbid color, >50 WBCs/hpf, >50 RBCs/hpf, many bacteria, and WBC clumps; hemoglobin 11.8 (11.1 on 06/11/2021); and creatinine 1.20 (baseline 1.1).  CT stone study revealed abnormal appearance of the bladder that was nonspecific and consistent with retained blood clot versus bladder tumor.  He was able to spontaneously void in the emergency department with a residual of 71 mL.  Today he reports that his gross hematuria and dysuria started this morning.  He has not passed any clots.  He denies a history of gross hematuria or dysuria prior.  He is a prior smoker and states that he smoked for a "long time", but did not smoke heavily.  Unable to quantify his smoking history further.  Past Medical History:  Diagnosis Date   Angina of effort ([place])    BPH (benign prostatic hyperplasia)    Coronary artery disease    Encephalopathy    Hemiparesis (HCC)    Hyperlipidemia    Hypertension    MI (myocardial infarction) ([place])    Stroke [facility])     Past Surgical History:  Procedure Laterality Date   CORONARY/GRAFT ACUTE MI REVASCULARIZATION N/A 06/09/2021   Procedure: Coronary/Graft Acute MI Revascularization;  Surgeon: Isaias Cowman, MD;  Location: [place] [facility];  Service: Cardiovascular;  Laterality: N/A;   LEFT HEART CATH AND CORONARY ANGIOGRAPHY N/A 06/09/2021   Procedure: LEFT HEART CATH AND CORONARY ANGIOGRAPHY;  Surgeon: Isaias Cowman, MD;  Location: [place] [facility];  Service: Cardiovascular;   Laterality: N/A;    Home Medications:  Current Meds  Medication Sig   cefdinir (OMNICEF) 300 MG capsule Take 1 capsule (300 mg total) by mouth 2 (two) times daily for 10 days.    Allergies:  Allergies  Allergen Reactions   Penicillins Other (See Comments)    No family history on file.  Social History:  reports that he does not currently use alcohol. He reports that he does not currently use drugs. No history on file for tobacco use.  ROS: A complete review of systems was performed.  All systems are negative except for pertinent findings as noted.  Physical Exam:  Vital signs in last 24 hours: Temp:  [98.1 F (36.7 C)] 98.1 F (36.7 C) (06/05 1129) Pulse Rate:  [60-64] 60 (06/05 1544) Resp:  [18] 18 (06/05 1544) BP: (111-120)/(58-60) 120/60 (06/05 1544) SpO2:  [97 %-98 %] 98 % (06/05 1544) Weight:  [67.5 kg] 67.5 kg (06/05 1127) Constitutional:  Alert and oriented, no acute distress HEENT: [place] AT, moist mucus membranes Cardiovascular: No clubbing, cyanosis, or edema Respiratory: Normal respiratory effort Skin: No rashes, bruises or suspicious lesions Neurologic: Grossly intact, no focal deficits, moving all 4 extremities Psychiatric: Normal mood and affect  Laboratory Data:  Recent Labs    08/16/21 1130  WBC 3.1*  HGB 11.8*  HCT 36.9*   Recent Labs    08/16/21 1128  NA 140  K 3.5  CL 106  CO2 29  GLUCOSE 86  BUN 20  CREATININE 1.20  CALCIUM 9.3   Urinalysis    Component Value Date/Time   COLORURINE RED (A) 08/16/2021 1433   APPEARANCEUR TURBID (A) 08/16/2021 1433   LABSPEC 1.019 08/16/2021 1433   PHURINE  08/16/2021 1433    TEST NOT REPORTED DUE TO COLOR INTERFERENCE OF URINE PIGMENT   GLUCOSEU (A) 08/16/2021 1433    TEST NOT REPORTED DUE TO COLOR INTERFERENCE OF URINE PIGMENT   HGBUR (A) 08/16/2021 1433    TEST NOT REPORTED DUE TO COLOR INTERFERENCE OF URINE PIGMENT   BILIRUBINUR (A) 08/16/2021 1433    TEST NOT REPORTED DUE TO COLOR INTERFERENCE  OF URINE PIGMENT   KETONESUR (A) 08/16/2021 1433    TEST NOT REPORTED DUE TO COLOR INTERFERENCE OF URINE PIGMENT   PROTEINUR (A) 08/16/2021 1433    TEST NOT REPORTED DUE TO COLOR INTERFERENCE OF URINE PIGMENT   NITRITE (A) 08/16/2021 1433    TEST NOT REPORTED DUE TO COLOR INTERFERENCE OF URINE PIGMENT   LEUKOCYTESUR (A) 08/16/2021 1433    TEST NOT REPORTED DUE TO COLOR INTERFERENCE OF URINE PIGMENT   Results for orders placed or performed during the hospital encounter of 06/09/21  MRSA Next Gen by PCR, Nasal     Status: None   Collection Time: 06/09/21  9:49 PM   Specimen: Nasal Mucosa; Nasal Swab  Result Value Ref Range Status   MRSA by PCR Next Gen NOT DETECTED NOT DETECTED Final    Comment: (NOTE) The GeneXpert MRSA Assay (FDA approved for NASAL specimens only), is one component of a comprehensive MRSA colonization surveillance program. It is not intended to diagnose MRSA infection nor to guide or monitor treatment for MRSA infections. Test performance is not FDA approved in patients less than 26 years old. Performed at [facility], [address] [place], [place] [address]     Radiologic Imaging: CT Renal Stone Study  Result Date: 08/16/2021 CLINICAL DATA:  Flank pain, kidney stone suspected. Painful urination. Hematuria. EXAM: CT ABDOMEN AND PELVIS WITHOUT CONTRAST TECHNIQUE: Multidetector CT imaging of the abdomen and pelvis was performed following the standard protocol without IV contrast. RADIATION DOSE REDUCTION: This exam was performed according to the departmental dose-optimization program which includes automated exposure control, adjustment of the mA and/or kV according to patient size and/or use of iterative reconstruction technique. COMPARISON:  Ultrasound 11/19/2013 FINDINGS: Lower chest: Mild scarring or atelectasis at the lung bases. Coronary artery calcification and/or stents. Aortic atherosclerotic calcification. Hepatobiliary: Normal without contrast.  No  calcified gallstones. Pancreas: Normal Spleen: Normal Adrenals/Urinary Tract: Adrenal glands are normal. Bilateral renal cysts as seen previously, the largest in the lateral left kidney measuring 6.6 cm in diameter. No evidence of stone or hydronephrosis. Abnormal appearance of the bladder which could be due to blood clots in the bladder, but a urothelial/inferior bladder mass is not excluded. Stomach/Bowel: Stomach and small intestine are normal. Colon is normal. Vascular/Lymphatic: Aortic atherosclerosis. No aneurysm. IVC is normal. No adenopathy. Reproductive: Enlarged prostate. Other: No free fluid or air. Musculoskeletal: Ordinary chronic lumbar degenerative changes and osteoarthritis of the hips. IMPRESSION: No evidence of renal obstruction or stone disease. Bilateral renal cysts. Abnormal appearance of the bladder with hyperdense material along the bladder floor that could represent intraluminal blood clot or possibly a bladder wall mass lesion. Enlarged prostate. Electronically Signed   By: Nelson Chimes M.D.   On: 08/16/2021 11:47    Assessment & Plan:  87 year old comorbid male on DAPT who presents with a 1 day history of dysuria and gross  hematuria.  UA is consistent with acute cystitis.  Gross hematuria is likely multifactorial including infection, antiplatelet therapy, and possible underlying bladder mass.  He is voiding spontaneously with an appropriate residual and denies clot passage, low clinical suspicion for clot retention at this time.  Agree with empiric antibiotics.  Recommend following urine cultures for total of 7 days of culture appropriate therapy.  I recommend outpatient cystoscopy in our clinic.  Patient is in agreement with this plan.  Thank you for involving me in this patient's care, please page with any further questions or concerns.  Debroah Loop, PA-C 08/16/2021 4:14 PM

## 2021-08-16 NOTE — ED Notes (Signed)
Bladder Scan : 366m, MD made aware.

## 2021-08-16 NOTE — Discharge Instructions (Addendum)
You may have some intermittent hematuria or blood in your urine but if you develop inability to urinate for multiple hours or pain in your bladder then you should return to the ER because you may need to have a Foley placed for urinary retention.  You should follow-up for recheck of your hemoglobin in 2 days with your primary care doctor.  If you develop any weakness shortness of breath prior to that you can always return to the ER for sooner hemoglobin check.  Hopefully with the antibiotics this will help the bleeding stopped.  We have placed you on some antibiotics for a UTI.    IMPRESSION: No evidence of renal obstruction or stone disease. Bilateral renal cysts.   Abnormal appearance of the bladder with hyperdense material along the bladder floor that could represent intraluminal blood clot or possibly a bladder wall mass lesion.   Enlarged prostate.

## 2021-08-16 NOTE — ED Provider Notes (Addendum)
[facility] Provider Note    Event Date/Time   First MD Initiated Contact with Patient 08/16/21 1117     (approximate)   History   Abdominal Pain and Hematuria   HPI  Hunter Bailey is a 87 y.o. male with prior coronary disease, stroke  who comes in with hematuria.  Patient reports sudden onset of hematuria that started this morning with some painful urination.  There is concern of some possible abdominal pain but he is denying any pain right now to me.  He denies any fevers.  I reviewed the records from 06/09/2021 where patient had restenosis and started on Plavix.   Physical Exam   Triage Vital Signs: ED Triage Vitals  Enc Vitals Group     BP 08/16/21 1129 (!) 111/58     Pulse Rate 08/16/21 1129 64     Resp 08/16/21 1129 18     Temp 08/16/21 1129 98.1 F (36.7 C)     Temp Source 08/16/21 1129 Oral     SpO2 08/16/21 1129 97 %     Weight 08/16/21 1127 148 lb 13 oz (67.5 kg)     Height 08/16/21 1127 '5\' 8"'$  (1.727 m)     Head Circumference --      Peak Flow --      Pain Score 08/16/21 1221 3     Pain Loc --      Pain Edu? --      Excl. in [place]? --     Most recent vital signs: Vitals:   08/16/21 1129  BP: (!) 111/58  Pulse: 64  Resp: 18  Temp: 98.1 F (36.7 C)  SpO2: 97%     General: Awake, no distress.  CV:  Good peripheral perfusion.  Resp:  Normal effort.  Abd:  No distention.  Other:     ED Results / Procedures / Treatments   Labs (all labs ordered are listed, but only abnormal results are displayed) Labs Reviewed  CBC - Abnormal; Notable for the following components:      Result Value   WBC 3.1 (*)    RBC 3.93 (*)    Hemoglobin 11.8 (*)    HCT 36.9 (*)    All other components within normal limits  COMPREHENSIVE METABOLIC PANEL - Abnormal; Notable for the following components:   GFR, Estimated 60 (*)    All other components within normal limits  LIPASE, BLOOD - Abnormal; Notable for the following components:    Lipase 57 (*)    All other components within normal limits  URINALYSIS, ROUTINE W REFLEX MICROSCOPIC      RADIOLOGY I have reviewed the CT personally and interpreted and do not see any evidence of kidney stones but he does have a possible mass versus clot in his bladder  PROCEDURES:  Critical Care performed: No  Procedures   MEDICATIONS ORDERED IN ED: Medications  acetaminophen (TYLENOL) tablet 1,000 mg (1,000 mg Oral Given 08/16/21 1222)  sodium chloride 0.9 % bolus 500 mL (500 mLs Intravenous New Bag/Given 08/16/21 1311)     IMPRESSION / MDM / ASSESSMENT AND PLAN / ED COURSE  I reviewed the triage vital signs and the nursing notes.   Patient's presentation is most consistent with acute presentation with potential threat to life or bodily function.   Differential includes urinary retention, bladder mass, kidney stone, UTI.  CT scan concerning for either bladder mass versus blood clot.  His CT did not show significant retention so I given  him some fluids to see if he could pee on his own.  Initially he could not pee so I decided to consult Dr. Erlene Quan from urology however before they came down to put in the Foley the patient was able to urinate and had a postvoid of 70 cc therefore after discussion with urology holding off on Foley placement at this time however did discuss with patient needing to come back if he developed inability to urinate.  They had requested that I hold the Plavix but after reviewing the records and the fact that patient is on Plavix for restenosis I think that would be tricky and I would not advise doing it.  CBC shows stable hemoglobin.  CMP is normal.  Lipase slightly elevated  Patient had off pending urine and urology evaluation  Patient's urine does look consistent with hemorrhagic cystitis we will give dose ceftriaxone will send for culture.  Patient will be discharged after urology comes and sees him.  Patient reportedly has a allergy to penicillin  but unclear what it was.  Given his age I really do not want to put him on Cipro or Bactrim but I would like him to get a 10-day course for possible complicated cystitis therefore we will give him a dose of ceftriaxone and monitor him to make sure no reactions I did send him home on a course of cefdinir     FINAL CLINICAL IMPRESSION(S) / ED DIAGNOSES   Final diagnoses:  Hematuria, unspecified type  Bladder mass     Rx / DC Orders   ED Discharge Orders     None        Note:  This document was prepared using Dragon voice recognition software and may include unintentional dictation errors.   Vanessa [place], MD 08/16/21 1533    Vanessa [place], MD 08/16/21 [phone]

## 2021-08-17 DIAGNOSIS — I639 Cerebral infarction, unspecified: Secondary | ICD-10-CM | POA: Diagnosis not present

## 2021-08-17 DIAGNOSIS — I69013 Psychomotor deficit following nontraumatic subarachnoid hemorrhage: Secondary | ICD-10-CM | POA: Diagnosis not present

## 2021-08-17 DIAGNOSIS — I69354 Hemiplegia and hemiparesis following cerebral infarction affecting left non-dominant side: Secondary | ICD-10-CM | POA: Diagnosis not present

## 2021-08-17 DIAGNOSIS — I509 Heart failure, unspecified: Secondary | ICD-10-CM | POA: Diagnosis not present

## 2021-08-17 DIAGNOSIS — I69351 Hemiplegia and hemiparesis following cerebral infarction affecting right dominant side: Secondary | ICD-10-CM | POA: Diagnosis not present

## 2021-08-17 DIAGNOSIS — R1312 Dysphagia, oropharyngeal phase: Secondary | ICD-10-CM | POA: Diagnosis not present

## 2021-08-17 DIAGNOSIS — N3289 Other specified disorders of bladder: Secondary | ICD-10-CM | POA: Diagnosis not present

## 2021-08-17 DIAGNOSIS — I10 Essential (primary) hypertension: Secondary | ICD-10-CM | POA: Diagnosis not present

## 2021-08-17 DIAGNOSIS — N3001 Acute cystitis with hematuria: Secondary | ICD-10-CM | POA: Diagnosis not present

## 2021-08-17 DIAGNOSIS — I69398 Other sequelae of cerebral infarction: Secondary | ICD-10-CM | POA: Diagnosis not present

## 2021-08-17 DIAGNOSIS — L89152 Pressure ulcer of sacral region, stage 2: Secondary | ICD-10-CM | POA: Diagnosis not present

## 2021-08-17 DIAGNOSIS — M6281 Muscle weakness (generalized): Secondary | ICD-10-CM | POA: Diagnosis not present

## 2021-08-17 DIAGNOSIS — I69391 Dysphagia following cerebral infarction: Secondary | ICD-10-CM | POA: Diagnosis not present

## 2021-08-18 LAB — URINE CULTURE

## 2021-08-19 DIAGNOSIS — R1312 Dysphagia, oropharyngeal phase: Secondary | ICD-10-CM | POA: Diagnosis not present

## 2021-08-19 DIAGNOSIS — I69013 Psychomotor deficit following nontraumatic subarachnoid hemorrhage: Secondary | ICD-10-CM | POA: Diagnosis not present

## 2021-08-19 DIAGNOSIS — I69351 Hemiplegia and hemiparesis following cerebral infarction affecting right dominant side: Secondary | ICD-10-CM | POA: Diagnosis not present

## 2021-08-19 DIAGNOSIS — I509 Heart failure, unspecified: Secondary | ICD-10-CM | POA: Diagnosis not present

## 2021-08-19 DIAGNOSIS — I69391 Dysphagia following cerebral infarction: Secondary | ICD-10-CM | POA: Diagnosis not present

## 2021-08-19 DIAGNOSIS — I10 Essential (primary) hypertension: Secondary | ICD-10-CM | POA: Diagnosis not present

## 2021-08-19 DIAGNOSIS — I69354 Hemiplegia and hemiparesis following cerebral infarction affecting left non-dominant side: Secondary | ICD-10-CM | POA: Diagnosis not present

## 2021-08-19 DIAGNOSIS — L89152 Pressure ulcer of sacral region, stage 2: Secondary | ICD-10-CM | POA: Diagnosis not present

## 2021-08-19 DIAGNOSIS — I69398 Other sequelae of cerebral infarction: Secondary | ICD-10-CM | POA: Diagnosis not present

## 2021-08-19 DIAGNOSIS — M6281 Muscle weakness (generalized): Secondary | ICD-10-CM | POA: Diagnosis not present

## 2021-08-23 DIAGNOSIS — I69391 Dysphagia following cerebral infarction: Secondary | ICD-10-CM | POA: Diagnosis not present

## 2021-08-23 DIAGNOSIS — R1312 Dysphagia, oropharyngeal phase: Secondary | ICD-10-CM | POA: Diagnosis not present

## 2021-08-23 DIAGNOSIS — M6281 Muscle weakness (generalized): Secondary | ICD-10-CM | POA: Diagnosis not present

## 2021-08-23 DIAGNOSIS — I509 Heart failure, unspecified: Secondary | ICD-10-CM | POA: Diagnosis not present

## 2021-08-23 DIAGNOSIS — I10 Essential (primary) hypertension: Secondary | ICD-10-CM | POA: Diagnosis not present

## 2021-08-23 DIAGNOSIS — I69013 Psychomotor deficit following nontraumatic subarachnoid hemorrhage: Secondary | ICD-10-CM | POA: Diagnosis not present

## 2021-08-23 DIAGNOSIS — I69398 Other sequelae of cerebral infarction: Secondary | ICD-10-CM | POA: Diagnosis not present

## 2021-08-23 DIAGNOSIS — I69354 Hemiplegia and hemiparesis following cerebral infarction affecting left non-dominant side: Secondary | ICD-10-CM | POA: Diagnosis not present

## 2021-08-23 DIAGNOSIS — L89152 Pressure ulcer of sacral region, stage 2: Secondary | ICD-10-CM | POA: Diagnosis not present

## 2021-08-23 DIAGNOSIS — I69351 Hemiplegia and hemiparesis following cerebral infarction affecting right dominant side: Secondary | ICD-10-CM | POA: Diagnosis not present

## 2021-08-24 DIAGNOSIS — I10 Essential (primary) hypertension: Secondary | ICD-10-CM | POA: Diagnosis not present

## 2021-08-24 DIAGNOSIS — E038 Other specified hypothyroidism: Secondary | ICD-10-CM | POA: Diagnosis not present

## 2021-08-24 DIAGNOSIS — I639 Cerebral infarction, unspecified: Secondary | ICD-10-CM | POA: Diagnosis not present

## 2021-08-24 DIAGNOSIS — M6281 Muscle weakness (generalized): Secondary | ICD-10-CM | POA: Diagnosis not present

## 2021-08-24 DIAGNOSIS — I69013 Psychomotor deficit following nontraumatic subarachnoid hemorrhage: Secondary | ICD-10-CM | POA: Diagnosis not present

## 2021-08-24 DIAGNOSIS — I69391 Dysphagia following cerebral infarction: Secondary | ICD-10-CM | POA: Diagnosis not present

## 2021-08-24 DIAGNOSIS — I69351 Hemiplegia and hemiparesis following cerebral infarction affecting right dominant side: Secondary | ICD-10-CM | POA: Diagnosis not present

## 2021-08-24 DIAGNOSIS — E559 Vitamin D deficiency, unspecified: Secondary | ICD-10-CM | POA: Diagnosis not present

## 2021-08-24 DIAGNOSIS — I219 Acute myocardial infarction, unspecified: Secondary | ICD-10-CM | POA: Diagnosis not present

## 2021-08-24 DIAGNOSIS — E782 Mixed hyperlipidemia: Secondary | ICD-10-CM | POA: Diagnosis not present

## 2021-08-24 DIAGNOSIS — I69354 Hemiplegia and hemiparesis following cerebral infarction affecting left non-dominant side: Secondary | ICD-10-CM | POA: Diagnosis not present

## 2021-08-24 DIAGNOSIS — D518 Other vitamin B12 deficiency anemias: Secondary | ICD-10-CM | POA: Diagnosis not present

## 2021-08-24 DIAGNOSIS — E119 Type 2 diabetes mellitus without complications: Secondary | ICD-10-CM | POA: Diagnosis not present

## 2021-08-24 DIAGNOSIS — I509 Heart failure, unspecified: Secondary | ICD-10-CM | POA: Diagnosis not present

## 2021-08-24 DIAGNOSIS — R1312 Dysphagia, oropharyngeal phase: Secondary | ICD-10-CM | POA: Diagnosis not present

## 2021-08-24 DIAGNOSIS — I69398 Other sequelae of cerebral infarction: Secondary | ICD-10-CM | POA: Diagnosis not present

## 2021-08-24 DIAGNOSIS — H4010X0 Unspecified open-angle glaucoma, stage unspecified: Secondary | ICD-10-CM | POA: Diagnosis not present

## 2021-08-24 DIAGNOSIS — L89152 Pressure ulcer of sacral region, stage 2: Secondary | ICD-10-CM | POA: Diagnosis not present

## 2021-08-24 DIAGNOSIS — H2513 Age-related nuclear cataract, bilateral: Secondary | ICD-10-CM | POA: Diagnosis not present

## 2021-08-24 DIAGNOSIS — H25013 Cortical age-related cataract, bilateral: Secondary | ICD-10-CM | POA: Diagnosis not present

## 2021-08-25 DIAGNOSIS — L89152 Pressure ulcer of sacral region, stage 2: Secondary | ICD-10-CM | POA: Diagnosis not present

## 2021-08-25 DIAGNOSIS — I69351 Hemiplegia and hemiparesis following cerebral infarction affecting right dominant side: Secondary | ICD-10-CM | POA: Diagnosis not present

## 2021-08-25 DIAGNOSIS — R1312 Dysphagia, oropharyngeal phase: Secondary | ICD-10-CM | POA: Diagnosis not present

## 2021-08-25 DIAGNOSIS — I509 Heart failure, unspecified: Secondary | ICD-10-CM | POA: Diagnosis not present

## 2021-08-25 DIAGNOSIS — I69391 Dysphagia following cerebral infarction: Secondary | ICD-10-CM | POA: Diagnosis not present

## 2021-08-25 DIAGNOSIS — M6281 Muscle weakness (generalized): Secondary | ICD-10-CM | POA: Diagnosis not present

## 2021-08-25 DIAGNOSIS — I69013 Psychomotor deficit following nontraumatic subarachnoid hemorrhage: Secondary | ICD-10-CM | POA: Diagnosis not present

## 2021-08-25 DIAGNOSIS — I69398 Other sequelae of cerebral infarction: Secondary | ICD-10-CM | POA: Diagnosis not present

## 2021-08-25 DIAGNOSIS — I69354 Hemiplegia and hemiparesis following cerebral infarction affecting left non-dominant side: Secondary | ICD-10-CM | POA: Diagnosis not present

## 2021-08-25 DIAGNOSIS — I10 Essential (primary) hypertension: Secondary | ICD-10-CM | POA: Diagnosis not present

## 2021-08-30 DIAGNOSIS — I509 Heart failure, unspecified: Secondary | ICD-10-CM | POA: Diagnosis not present

## 2021-08-30 DIAGNOSIS — L89152 Pressure ulcer of sacral region, stage 2: Secondary | ICD-10-CM | POA: Diagnosis not present

## 2021-08-30 DIAGNOSIS — I10 Essential (primary) hypertension: Secondary | ICD-10-CM | POA: Diagnosis not present

## 2021-08-30 DIAGNOSIS — I69391 Dysphagia following cerebral infarction: Secondary | ICD-10-CM | POA: Diagnosis not present

## 2021-08-30 DIAGNOSIS — I69398 Other sequelae of cerebral infarction: Secondary | ICD-10-CM | POA: Diagnosis not present

## 2021-08-30 DIAGNOSIS — I69013 Psychomotor deficit following nontraumatic subarachnoid hemorrhage: Secondary | ICD-10-CM | POA: Diagnosis not present

## 2021-08-30 DIAGNOSIS — M6281 Muscle weakness (generalized): Secondary | ICD-10-CM | POA: Diagnosis not present

## 2021-08-30 DIAGNOSIS — I69354 Hemiplegia and hemiparesis following cerebral infarction affecting left non-dominant side: Secondary | ICD-10-CM | POA: Diagnosis not present

## 2021-08-30 DIAGNOSIS — I69351 Hemiplegia and hemiparesis following cerebral infarction affecting right dominant side: Secondary | ICD-10-CM | POA: Diagnosis not present

## 2021-08-30 DIAGNOSIS — R1312 Dysphagia, oropharyngeal phase: Secondary | ICD-10-CM | POA: Diagnosis not present

## 2021-08-31 DIAGNOSIS — D518 Other vitamin B12 deficiency anemias: Secondary | ICD-10-CM | POA: Diagnosis not present

## 2021-08-31 DIAGNOSIS — I69391 Dysphagia following cerebral infarction: Secondary | ICD-10-CM | POA: Diagnosis not present

## 2021-08-31 DIAGNOSIS — I69354 Hemiplegia and hemiparesis following cerebral infarction affecting left non-dominant side: Secondary | ICD-10-CM | POA: Diagnosis not present

## 2021-08-31 DIAGNOSIS — I69351 Hemiplegia and hemiparesis following cerebral infarction affecting right dominant side: Secondary | ICD-10-CM | POA: Diagnosis not present

## 2021-08-31 DIAGNOSIS — E782 Mixed hyperlipidemia: Secondary | ICD-10-CM | POA: Diagnosis not present

## 2021-08-31 DIAGNOSIS — L89152 Pressure ulcer of sacral region, stage 2: Secondary | ICD-10-CM | POA: Diagnosis not present

## 2021-08-31 DIAGNOSIS — I69398 Other sequelae of cerebral infarction: Secondary | ICD-10-CM | POA: Diagnosis not present

## 2021-08-31 DIAGNOSIS — E038 Other specified hypothyroidism: Secondary | ICD-10-CM | POA: Diagnosis not present

## 2021-08-31 DIAGNOSIS — E559 Vitamin D deficiency, unspecified: Secondary | ICD-10-CM | POA: Diagnosis not present

## 2021-08-31 DIAGNOSIS — R1312 Dysphagia, oropharyngeal phase: Secondary | ICD-10-CM | POA: Diagnosis not present

## 2021-08-31 DIAGNOSIS — I69013 Psychomotor deficit following nontraumatic subarachnoid hemorrhage: Secondary | ICD-10-CM | POA: Diagnosis not present

## 2021-08-31 DIAGNOSIS — Z79899 Other long term (current) drug therapy: Secondary | ICD-10-CM | POA: Diagnosis not present

## 2021-08-31 DIAGNOSIS — E119 Type 2 diabetes mellitus without complications: Secondary | ICD-10-CM | POA: Diagnosis not present

## 2021-08-31 DIAGNOSIS — M6281 Muscle weakness (generalized): Secondary | ICD-10-CM | POA: Diagnosis not present

## 2021-08-31 DIAGNOSIS — I10 Essential (primary) hypertension: Secondary | ICD-10-CM | POA: Diagnosis not present

## 2021-08-31 DIAGNOSIS — I509 Heart failure, unspecified: Secondary | ICD-10-CM | POA: Diagnosis not present

## 2021-09-01 DIAGNOSIS — I69391 Dysphagia following cerebral infarction: Secondary | ICD-10-CM | POA: Diagnosis not present

## 2021-09-01 DIAGNOSIS — I509 Heart failure, unspecified: Secondary | ICD-10-CM | POA: Diagnosis not present

## 2021-09-01 DIAGNOSIS — I10 Essential (primary) hypertension: Secondary | ICD-10-CM | POA: Diagnosis not present

## 2021-09-01 DIAGNOSIS — M6281 Muscle weakness (generalized): Secondary | ICD-10-CM | POA: Diagnosis not present

## 2021-09-01 DIAGNOSIS — I69351 Hemiplegia and hemiparesis following cerebral infarction affecting right dominant side: Secondary | ICD-10-CM | POA: Diagnosis not present

## 2021-09-01 DIAGNOSIS — L89152 Pressure ulcer of sacral region, stage 2: Secondary | ICD-10-CM | POA: Diagnosis not present

## 2021-09-01 DIAGNOSIS — R1312 Dysphagia, oropharyngeal phase: Secondary | ICD-10-CM | POA: Diagnosis not present

## 2021-09-01 DIAGNOSIS — I69013 Psychomotor deficit following nontraumatic subarachnoid hemorrhage: Secondary | ICD-10-CM | POA: Diagnosis not present

## 2021-09-01 DIAGNOSIS — I69354 Hemiplegia and hemiparesis following cerebral infarction affecting left non-dominant side: Secondary | ICD-10-CM | POA: Diagnosis not present

## 2021-09-01 DIAGNOSIS — I69398 Other sequelae of cerebral infarction: Secondary | ICD-10-CM | POA: Diagnosis not present

## 2021-09-02 DIAGNOSIS — I69398 Other sequelae of cerebral infarction: Secondary | ICD-10-CM | POA: Diagnosis not present

## 2021-09-02 DIAGNOSIS — I69351 Hemiplegia and hemiparesis following cerebral infarction affecting right dominant side: Secondary | ICD-10-CM | POA: Diagnosis not present

## 2021-09-02 DIAGNOSIS — I509 Heart failure, unspecified: Secondary | ICD-10-CM | POA: Diagnosis not present

## 2021-09-02 DIAGNOSIS — I69391 Dysphagia following cerebral infarction: Secondary | ICD-10-CM | POA: Diagnosis not present

## 2021-09-02 DIAGNOSIS — R1312 Dysphagia, oropharyngeal phase: Secondary | ICD-10-CM | POA: Diagnosis not present

## 2021-09-02 DIAGNOSIS — L89152 Pressure ulcer of sacral region, stage 2: Secondary | ICD-10-CM | POA: Diagnosis not present

## 2021-09-02 DIAGNOSIS — I10 Essential (primary) hypertension: Secondary | ICD-10-CM | POA: Diagnosis not present

## 2021-09-02 DIAGNOSIS — I69354 Hemiplegia and hemiparesis following cerebral infarction affecting left non-dominant side: Secondary | ICD-10-CM | POA: Diagnosis not present

## 2021-09-02 DIAGNOSIS — M6281 Muscle weakness (generalized): Secondary | ICD-10-CM | POA: Diagnosis not present

## 2021-09-02 DIAGNOSIS — I69013 Psychomotor deficit following nontraumatic subarachnoid hemorrhage: Secondary | ICD-10-CM | POA: Diagnosis not present

## 2021-09-06 DIAGNOSIS — I69391 Dysphagia following cerebral infarction: Secondary | ICD-10-CM | POA: Diagnosis not present

## 2021-09-06 DIAGNOSIS — I69354 Hemiplegia and hemiparesis following cerebral infarction affecting left non-dominant side: Secondary | ICD-10-CM | POA: Diagnosis not present

## 2021-09-06 DIAGNOSIS — M2042 Other hammer toe(s) (acquired), left foot: Secondary | ICD-10-CM | POA: Diagnosis not present

## 2021-09-06 DIAGNOSIS — L89152 Pressure ulcer of sacral region, stage 2: Secondary | ICD-10-CM | POA: Diagnosis not present

## 2021-09-06 DIAGNOSIS — M79672 Pain in left foot: Secondary | ICD-10-CM | POA: Diagnosis not present

## 2021-09-06 DIAGNOSIS — M2011 Hallux valgus (acquired), right foot: Secondary | ICD-10-CM | POA: Diagnosis not present

## 2021-09-06 DIAGNOSIS — R1312 Dysphagia, oropharyngeal phase: Secondary | ICD-10-CM | POA: Diagnosis not present

## 2021-09-06 DIAGNOSIS — M6281 Muscle weakness (generalized): Secondary | ICD-10-CM | POA: Diagnosis not present

## 2021-09-06 DIAGNOSIS — B351 Tinea unguium: Secondary | ICD-10-CM | POA: Diagnosis not present

## 2021-09-06 DIAGNOSIS — I509 Heart failure, unspecified: Secondary | ICD-10-CM | POA: Diagnosis not present

## 2021-09-06 DIAGNOSIS — I69398 Other sequelae of cerebral infarction: Secondary | ICD-10-CM | POA: Diagnosis not present

## 2021-09-06 DIAGNOSIS — I69013 Psychomotor deficit following nontraumatic subarachnoid hemorrhage: Secondary | ICD-10-CM | POA: Diagnosis not present

## 2021-09-06 DIAGNOSIS — M2012 Hallux valgus (acquired), left foot: Secondary | ICD-10-CM | POA: Diagnosis not present

## 2021-09-06 DIAGNOSIS — L600 Ingrowing nail: Secondary | ICD-10-CM | POA: Diagnosis not present

## 2021-09-06 DIAGNOSIS — I10 Essential (primary) hypertension: Secondary | ICD-10-CM | POA: Diagnosis not present

## 2021-09-06 DIAGNOSIS — M2041 Other hammer toe(s) (acquired), right foot: Secondary | ICD-10-CM | POA: Diagnosis not present

## 2021-09-06 DIAGNOSIS — M79671 Pain in right foot: Secondary | ICD-10-CM | POA: Diagnosis not present

## 2021-09-06 DIAGNOSIS — I69351 Hemiplegia and hemiparesis following cerebral infarction affecting right dominant side: Secondary | ICD-10-CM | POA: Diagnosis not present

## 2021-09-07 DIAGNOSIS — I69398 Other sequelae of cerebral infarction: Secondary | ICD-10-CM | POA: Diagnosis not present

## 2021-09-07 DIAGNOSIS — R1312 Dysphagia, oropharyngeal phase: Secondary | ICD-10-CM | POA: Diagnosis not present

## 2021-09-07 DIAGNOSIS — I10 Essential (primary) hypertension: Secondary | ICD-10-CM | POA: Diagnosis not present

## 2021-09-07 DIAGNOSIS — I69391 Dysphagia following cerebral infarction: Secondary | ICD-10-CM | POA: Diagnosis not present

## 2021-09-07 DIAGNOSIS — I69013 Psychomotor deficit following nontraumatic subarachnoid hemorrhage: Secondary | ICD-10-CM | POA: Diagnosis not present

## 2021-09-07 DIAGNOSIS — I69351 Hemiplegia and hemiparesis following cerebral infarction affecting right dominant side: Secondary | ICD-10-CM | POA: Diagnosis not present

## 2021-09-07 DIAGNOSIS — M6281 Muscle weakness (generalized): Secondary | ICD-10-CM | POA: Diagnosis not present

## 2021-09-07 DIAGNOSIS — I69354 Hemiplegia and hemiparesis following cerebral infarction affecting left non-dominant side: Secondary | ICD-10-CM | POA: Diagnosis not present

## 2021-09-07 DIAGNOSIS — I509 Heart failure, unspecified: Secondary | ICD-10-CM | POA: Diagnosis not present

## 2021-09-07 DIAGNOSIS — L89152 Pressure ulcer of sacral region, stage 2: Secondary | ICD-10-CM | POA: Diagnosis not present

## 2021-09-08 DIAGNOSIS — I69398 Other sequelae of cerebral infarction: Secondary | ICD-10-CM | POA: Diagnosis not present

## 2021-09-08 DIAGNOSIS — R1312 Dysphagia, oropharyngeal phase: Secondary | ICD-10-CM | POA: Diagnosis not present

## 2021-09-08 DIAGNOSIS — I69354 Hemiplegia and hemiparesis following cerebral infarction affecting left non-dominant side: Secondary | ICD-10-CM | POA: Diagnosis not present

## 2021-09-08 DIAGNOSIS — L89152 Pressure ulcer of sacral region, stage 2: Secondary | ICD-10-CM | POA: Diagnosis not present

## 2021-09-08 DIAGNOSIS — I69391 Dysphagia following cerebral infarction: Secondary | ICD-10-CM | POA: Diagnosis not present

## 2021-09-08 DIAGNOSIS — I509 Heart failure, unspecified: Secondary | ICD-10-CM | POA: Diagnosis not present

## 2021-09-08 DIAGNOSIS — I10 Essential (primary) hypertension: Secondary | ICD-10-CM | POA: Diagnosis not present

## 2021-09-08 DIAGNOSIS — I69351 Hemiplegia and hemiparesis following cerebral infarction affecting right dominant side: Secondary | ICD-10-CM | POA: Diagnosis not present

## 2021-09-08 DIAGNOSIS — M6281 Muscle weakness (generalized): Secondary | ICD-10-CM | POA: Diagnosis not present

## 2021-09-08 DIAGNOSIS — I69013 Psychomotor deficit following nontraumatic subarachnoid hemorrhage: Secondary | ICD-10-CM | POA: Diagnosis not present

## 2021-09-11 DIAGNOSIS — I69354 Hemiplegia and hemiparesis following cerebral infarction affecting left non-dominant side: Secondary | ICD-10-CM | POA: Diagnosis not present

## 2021-09-11 DIAGNOSIS — J449 Chronic obstructive pulmonary disease, unspecified: Secondary | ICD-10-CM | POA: Diagnosis not present

## 2021-09-15 DIAGNOSIS — R1312 Dysphagia, oropharyngeal phase: Secondary | ICD-10-CM | POA: Diagnosis not present

## 2021-09-15 DIAGNOSIS — I69013 Psychomotor deficit following nontraumatic subarachnoid hemorrhage: Secondary | ICD-10-CM | POA: Diagnosis not present

## 2021-09-15 DIAGNOSIS — I69351 Hemiplegia and hemiparesis following cerebral infarction affecting right dominant side: Secondary | ICD-10-CM | POA: Diagnosis not present

## 2021-09-15 DIAGNOSIS — I69391 Dysphagia following cerebral infarction: Secondary | ICD-10-CM | POA: Diagnosis not present

## 2021-09-15 DIAGNOSIS — M6281 Muscle weakness (generalized): Secondary | ICD-10-CM | POA: Diagnosis not present

## 2021-09-15 DIAGNOSIS — L89152 Pressure ulcer of sacral region, stage 2: Secondary | ICD-10-CM | POA: Diagnosis not present

## 2021-09-15 DIAGNOSIS — I69354 Hemiplegia and hemiparesis following cerebral infarction affecting left non-dominant side: Secondary | ICD-10-CM | POA: Diagnosis not present

## 2021-09-15 DIAGNOSIS — I10 Essential (primary) hypertension: Secondary | ICD-10-CM | POA: Diagnosis not present

## 2021-09-15 DIAGNOSIS — I509 Heart failure, unspecified: Secondary | ICD-10-CM | POA: Diagnosis not present

## 2021-09-15 DIAGNOSIS — I69398 Other sequelae of cerebral infarction: Secondary | ICD-10-CM | POA: Diagnosis not present

## 2021-09-16 DIAGNOSIS — I69391 Dysphagia following cerebral infarction: Secondary | ICD-10-CM | POA: Diagnosis not present

## 2021-09-16 DIAGNOSIS — I69351 Hemiplegia and hemiparesis following cerebral infarction affecting right dominant side: Secondary | ICD-10-CM | POA: Diagnosis not present

## 2021-09-16 DIAGNOSIS — I10 Essential (primary) hypertension: Secondary | ICD-10-CM | POA: Diagnosis not present

## 2021-09-16 DIAGNOSIS — R1312 Dysphagia, oropharyngeal phase: Secondary | ICD-10-CM | POA: Diagnosis not present

## 2021-09-16 DIAGNOSIS — I69013 Psychomotor deficit following nontraumatic subarachnoid hemorrhage: Secondary | ICD-10-CM | POA: Diagnosis not present

## 2021-09-16 DIAGNOSIS — I69354 Hemiplegia and hemiparesis following cerebral infarction affecting left non-dominant side: Secondary | ICD-10-CM | POA: Diagnosis not present

## 2021-09-16 DIAGNOSIS — M6281 Muscle weakness (generalized): Secondary | ICD-10-CM | POA: Diagnosis not present

## 2021-09-16 DIAGNOSIS — L89152 Pressure ulcer of sacral region, stage 2: Secondary | ICD-10-CM | POA: Diagnosis not present

## 2021-09-16 DIAGNOSIS — I509 Heart failure, unspecified: Secondary | ICD-10-CM | POA: Diagnosis not present

## 2021-09-16 DIAGNOSIS — I69398 Other sequelae of cerebral infarction: Secondary | ICD-10-CM | POA: Diagnosis not present

## 2021-09-17 DIAGNOSIS — I69398 Other sequelae of cerebral infarction: Secondary | ICD-10-CM | POA: Diagnosis not present

## 2021-09-17 DIAGNOSIS — I69391 Dysphagia following cerebral infarction: Secondary | ICD-10-CM | POA: Diagnosis not present

## 2021-09-17 DIAGNOSIS — I10 Essential (primary) hypertension: Secondary | ICD-10-CM | POA: Diagnosis not present

## 2021-09-17 DIAGNOSIS — M6281 Muscle weakness (generalized): Secondary | ICD-10-CM | POA: Diagnosis not present

## 2021-09-17 DIAGNOSIS — I509 Heart failure, unspecified: Secondary | ICD-10-CM | POA: Diagnosis not present

## 2021-09-17 DIAGNOSIS — L89152 Pressure ulcer of sacral region, stage 2: Secondary | ICD-10-CM | POA: Diagnosis not present

## 2021-09-17 DIAGNOSIS — I69354 Hemiplegia and hemiparesis following cerebral infarction affecting left non-dominant side: Secondary | ICD-10-CM | POA: Diagnosis not present

## 2021-09-17 DIAGNOSIS — R1312 Dysphagia, oropharyngeal phase: Secondary | ICD-10-CM | POA: Diagnosis not present

## 2021-09-17 DIAGNOSIS — I69013 Psychomotor deficit following nontraumatic subarachnoid hemorrhage: Secondary | ICD-10-CM | POA: Diagnosis not present

## 2021-09-17 DIAGNOSIS — I69351 Hemiplegia and hemiparesis following cerebral infarction affecting right dominant side: Secondary | ICD-10-CM | POA: Diagnosis not present

## 2021-09-21 DIAGNOSIS — I82409 Acute embolism and thrombosis of unspecified deep veins of unspecified lower extremity: Secondary | ICD-10-CM | POA: Diagnosis not present

## 2021-09-21 DIAGNOSIS — K219 Gastro-esophageal reflux disease without esophagitis: Secondary | ICD-10-CM | POA: Diagnosis not present

## 2021-09-21 DIAGNOSIS — I10 Essential (primary) hypertension: Secondary | ICD-10-CM | POA: Diagnosis not present

## 2021-09-21 DIAGNOSIS — I639 Cerebral infarction, unspecified: Secondary | ICD-10-CM | POA: Diagnosis not present

## 2021-09-21 DIAGNOSIS — I69391 Dysphagia following cerebral infarction: Secondary | ICD-10-CM | POA: Diagnosis not present

## 2021-09-21 DIAGNOSIS — R1312 Dysphagia, oropharyngeal phase: Secondary | ICD-10-CM | POA: Diagnosis not present

## 2021-09-21 DIAGNOSIS — I69398 Other sequelae of cerebral infarction: Secondary | ICD-10-CM | POA: Diagnosis not present

## 2021-09-21 DIAGNOSIS — I69351 Hemiplegia and hemiparesis following cerebral infarction affecting right dominant side: Secondary | ICD-10-CM | POA: Diagnosis not present

## 2021-09-21 DIAGNOSIS — I69354 Hemiplegia and hemiparesis following cerebral infarction affecting left non-dominant side: Secondary | ICD-10-CM | POA: Diagnosis not present

## 2021-09-21 DIAGNOSIS — L89152 Pressure ulcer of sacral region, stage 2: Secondary | ICD-10-CM | POA: Diagnosis not present

## 2021-09-21 DIAGNOSIS — I509 Heart failure, unspecified: Secondary | ICD-10-CM | POA: Diagnosis not present

## 2021-09-21 DIAGNOSIS — I69013 Psychomotor deficit following nontraumatic subarachnoid hemorrhage: Secondary | ICD-10-CM | POA: Diagnosis not present

## 2021-09-21 DIAGNOSIS — M6281 Muscle weakness (generalized): Secondary | ICD-10-CM | POA: Diagnosis not present

## 2021-09-21 DIAGNOSIS — I739 Peripheral vascular disease, unspecified: Secondary | ICD-10-CM | POA: Diagnosis not present

## 2021-09-22 DIAGNOSIS — D518 Other vitamin B12 deficiency anemias: Secondary | ICD-10-CM | POA: Diagnosis not present

## 2021-09-22 DIAGNOSIS — I219 Acute myocardial infarction, unspecified: Secondary | ICD-10-CM | POA: Diagnosis not present

## 2021-09-22 DIAGNOSIS — E559 Vitamin D deficiency, unspecified: Secondary | ICD-10-CM | POA: Diagnosis not present

## 2021-09-22 DIAGNOSIS — E782 Mixed hyperlipidemia: Secondary | ICD-10-CM | POA: Diagnosis not present

## 2021-09-22 DIAGNOSIS — E119 Type 2 diabetes mellitus without complications: Secondary | ICD-10-CM | POA: Diagnosis not present

## 2021-09-22 DIAGNOSIS — E038 Other specified hypothyroidism: Secondary | ICD-10-CM | POA: Diagnosis not present

## 2021-09-22 DIAGNOSIS — I10 Essential (primary) hypertension: Secondary | ICD-10-CM | POA: Diagnosis not present

## 2021-09-22 DIAGNOSIS — I639 Cerebral infarction, unspecified: Secondary | ICD-10-CM | POA: Diagnosis not present

## 2021-09-23 DIAGNOSIS — L89152 Pressure ulcer of sacral region, stage 2: Secondary | ICD-10-CM | POA: Diagnosis not present

## 2021-09-23 DIAGNOSIS — I69391 Dysphagia following cerebral infarction: Secondary | ICD-10-CM | POA: Diagnosis not present

## 2021-09-23 DIAGNOSIS — I69013 Psychomotor deficit following nontraumatic subarachnoid hemorrhage: Secondary | ICD-10-CM | POA: Diagnosis not present

## 2021-09-23 DIAGNOSIS — I69354 Hemiplegia and hemiparesis following cerebral infarction affecting left non-dominant side: Secondary | ICD-10-CM | POA: Diagnosis not present

## 2021-09-23 DIAGNOSIS — I69398 Other sequelae of cerebral infarction: Secondary | ICD-10-CM | POA: Diagnosis not present

## 2021-09-23 DIAGNOSIS — R1312 Dysphagia, oropharyngeal phase: Secondary | ICD-10-CM | POA: Diagnosis not present

## 2021-09-23 DIAGNOSIS — I509 Heart failure, unspecified: Secondary | ICD-10-CM | POA: Diagnosis not present

## 2021-09-23 DIAGNOSIS — I69351 Hemiplegia and hemiparesis following cerebral infarction affecting right dominant side: Secondary | ICD-10-CM | POA: Diagnosis not present

## 2021-09-23 DIAGNOSIS — I10 Essential (primary) hypertension: Secondary | ICD-10-CM | POA: Diagnosis not present

## 2021-09-23 DIAGNOSIS — M6281 Muscle weakness (generalized): Secondary | ICD-10-CM | POA: Diagnosis not present

## 2021-09-24 DIAGNOSIS — I69398 Other sequelae of cerebral infarction: Secondary | ICD-10-CM | POA: Diagnosis not present

## 2021-09-24 DIAGNOSIS — I69013 Psychomotor deficit following nontraumatic subarachnoid hemorrhage: Secondary | ICD-10-CM | POA: Diagnosis not present

## 2021-09-24 DIAGNOSIS — L89152 Pressure ulcer of sacral region, stage 2: Secondary | ICD-10-CM | POA: Diagnosis not present

## 2021-09-24 DIAGNOSIS — I69354 Hemiplegia and hemiparesis following cerebral infarction affecting left non-dominant side: Secondary | ICD-10-CM | POA: Diagnosis not present

## 2021-09-24 DIAGNOSIS — M6281 Muscle weakness (generalized): Secondary | ICD-10-CM | POA: Diagnosis not present

## 2021-09-24 DIAGNOSIS — I69351 Hemiplegia and hemiparesis following cerebral infarction affecting right dominant side: Secondary | ICD-10-CM | POA: Diagnosis not present

## 2021-09-24 DIAGNOSIS — R1312 Dysphagia, oropharyngeal phase: Secondary | ICD-10-CM | POA: Diagnosis not present

## 2021-09-24 DIAGNOSIS — I10 Essential (primary) hypertension: Secondary | ICD-10-CM | POA: Diagnosis not present

## 2021-09-24 DIAGNOSIS — I69391 Dysphagia following cerebral infarction: Secondary | ICD-10-CM | POA: Diagnosis not present

## 2021-09-24 DIAGNOSIS — I509 Heart failure, unspecified: Secondary | ICD-10-CM | POA: Diagnosis not present

## 2021-09-28 DIAGNOSIS — I509 Heart failure, unspecified: Secondary | ICD-10-CM | POA: Diagnosis not present

## 2021-09-28 DIAGNOSIS — M6281 Muscle weakness (generalized): Secondary | ICD-10-CM | POA: Diagnosis not present

## 2021-09-28 DIAGNOSIS — I110 Hypertensive heart disease with heart failure: Secondary | ICD-10-CM | POA: Diagnosis not present

## 2021-09-28 DIAGNOSIS — I69391 Dysphagia following cerebral infarction: Secondary | ICD-10-CM | POA: Diagnosis not present

## 2021-09-28 DIAGNOSIS — I69351 Hemiplegia and hemiparesis following cerebral infarction affecting right dominant side: Secondary | ICD-10-CM | POA: Diagnosis not present

## 2021-09-28 DIAGNOSIS — R1312 Dysphagia, oropharyngeal phase: Secondary | ICD-10-CM | POA: Diagnosis not present

## 2021-09-28 DIAGNOSIS — I69828 Other speech and language deficits following other cerebrovascular disease: Secondary | ICD-10-CM | POA: Diagnosis not present

## 2021-09-28 DIAGNOSIS — I69354 Hemiplegia and hemiparesis following cerebral infarction affecting left non-dominant side: Secondary | ICD-10-CM | POA: Diagnosis not present

## 2021-09-28 DIAGNOSIS — I69398 Other sequelae of cerebral infarction: Secondary | ICD-10-CM | POA: Diagnosis not present

## 2021-09-28 DIAGNOSIS — I69013 Psychomotor deficit following nontraumatic subarachnoid hemorrhage: Secondary | ICD-10-CM | POA: Diagnosis not present

## 2021-09-29 DIAGNOSIS — I69013 Psychomotor deficit following nontraumatic subarachnoid hemorrhage: Secondary | ICD-10-CM | POA: Diagnosis not present

## 2021-09-29 DIAGNOSIS — M6281 Muscle weakness (generalized): Secondary | ICD-10-CM | POA: Diagnosis not present

## 2021-09-29 DIAGNOSIS — I69391 Dysphagia following cerebral infarction: Secondary | ICD-10-CM | POA: Diagnosis not present

## 2021-09-29 DIAGNOSIS — I69828 Other speech and language deficits following other cerebrovascular disease: Secondary | ICD-10-CM | POA: Diagnosis not present

## 2021-09-29 DIAGNOSIS — I69354 Hemiplegia and hemiparesis following cerebral infarction affecting left non-dominant side: Secondary | ICD-10-CM | POA: Diagnosis not present

## 2021-09-29 DIAGNOSIS — I69351 Hemiplegia and hemiparesis following cerebral infarction affecting right dominant side: Secondary | ICD-10-CM | POA: Diagnosis not present

## 2021-09-29 DIAGNOSIS — I69398 Other sequelae of cerebral infarction: Secondary | ICD-10-CM | POA: Diagnosis not present

## 2021-09-29 DIAGNOSIS — R1312 Dysphagia, oropharyngeal phase: Secondary | ICD-10-CM | POA: Diagnosis not present

## 2021-09-29 DIAGNOSIS — I509 Heart failure, unspecified: Secondary | ICD-10-CM | POA: Diagnosis not present

## 2021-09-29 DIAGNOSIS — I110 Hypertensive heart disease with heart failure: Secondary | ICD-10-CM | POA: Diagnosis not present

## 2021-10-01 DIAGNOSIS — I69354 Hemiplegia and hemiparesis following cerebral infarction affecting left non-dominant side: Secondary | ICD-10-CM | POA: Diagnosis not present

## 2021-10-01 DIAGNOSIS — I69828 Other speech and language deficits following other cerebrovascular disease: Secondary | ICD-10-CM | POA: Diagnosis not present

## 2021-10-01 DIAGNOSIS — R1312 Dysphagia, oropharyngeal phase: Secondary | ICD-10-CM | POA: Diagnosis not present

## 2021-10-01 DIAGNOSIS — I69391 Dysphagia following cerebral infarction: Secondary | ICD-10-CM | POA: Diagnosis not present

## 2021-10-01 DIAGNOSIS — M6281 Muscle weakness (generalized): Secondary | ICD-10-CM | POA: Diagnosis not present

## 2021-10-01 DIAGNOSIS — I509 Heart failure, unspecified: Secondary | ICD-10-CM | POA: Diagnosis not present

## 2021-10-01 DIAGNOSIS — I110 Hypertensive heart disease with heart failure: Secondary | ICD-10-CM | POA: Diagnosis not present

## 2021-10-01 DIAGNOSIS — I69351 Hemiplegia and hemiparesis following cerebral infarction affecting right dominant side: Secondary | ICD-10-CM | POA: Diagnosis not present

## 2021-10-01 DIAGNOSIS — I69013 Psychomotor deficit following nontraumatic subarachnoid hemorrhage: Secondary | ICD-10-CM | POA: Diagnosis not present

## 2021-10-01 DIAGNOSIS — I69398 Other sequelae of cerebral infarction: Secondary | ICD-10-CM | POA: Diagnosis not present

## 2021-10-04 DIAGNOSIS — I69398 Other sequelae of cerebral infarction: Secondary | ICD-10-CM | POA: Diagnosis not present

## 2021-10-04 DIAGNOSIS — I69351 Hemiplegia and hemiparesis following cerebral infarction affecting right dominant side: Secondary | ICD-10-CM | POA: Diagnosis not present

## 2021-10-04 DIAGNOSIS — M6281 Muscle weakness (generalized): Secondary | ICD-10-CM | POA: Diagnosis not present

## 2021-10-04 DIAGNOSIS — I509 Heart failure, unspecified: Secondary | ICD-10-CM | POA: Diagnosis not present

## 2021-10-04 DIAGNOSIS — I69391 Dysphagia following cerebral infarction: Secondary | ICD-10-CM | POA: Diagnosis not present

## 2021-10-04 DIAGNOSIS — R1312 Dysphagia, oropharyngeal phase: Secondary | ICD-10-CM | POA: Diagnosis not present

## 2021-10-04 DIAGNOSIS — I110 Hypertensive heart disease with heart failure: Secondary | ICD-10-CM | POA: Diagnosis not present

## 2021-10-04 DIAGNOSIS — I69013 Psychomotor deficit following nontraumatic subarachnoid hemorrhage: Secondary | ICD-10-CM | POA: Diagnosis not present

## 2021-10-04 DIAGNOSIS — I69354 Hemiplegia and hemiparesis following cerebral infarction affecting left non-dominant side: Secondary | ICD-10-CM | POA: Diagnosis not present

## 2021-10-04 DIAGNOSIS — I69828 Other speech and language deficits following other cerebrovascular disease: Secondary | ICD-10-CM | POA: Diagnosis not present

## 2021-10-05 DIAGNOSIS — I69398 Other sequelae of cerebral infarction: Secondary | ICD-10-CM | POA: Diagnosis not present

## 2021-10-05 DIAGNOSIS — I509 Heart failure, unspecified: Secondary | ICD-10-CM | POA: Diagnosis not present

## 2021-10-05 DIAGNOSIS — I69828 Other speech and language deficits following other cerebrovascular disease: Secondary | ICD-10-CM | POA: Diagnosis not present

## 2021-10-05 DIAGNOSIS — I10 Essential (primary) hypertension: Secondary | ICD-10-CM | POA: Diagnosis not present

## 2021-10-05 DIAGNOSIS — R1312 Dysphagia, oropharyngeal phase: Secondary | ICD-10-CM | POA: Diagnosis not present

## 2021-10-05 DIAGNOSIS — I25118 Atherosclerotic heart disease of native coronary artery with other forms of angina pectoris: Secondary | ICD-10-CM | POA: Diagnosis not present

## 2021-10-05 DIAGNOSIS — M6281 Muscle weakness (generalized): Secondary | ICD-10-CM | POA: Diagnosis not present

## 2021-10-05 DIAGNOSIS — I69013 Psychomotor deficit following nontraumatic subarachnoid hemorrhage: Secondary | ICD-10-CM | POA: Diagnosis not present

## 2021-10-05 DIAGNOSIS — R7303 Prediabetes: Secondary | ICD-10-CM | POA: Diagnosis not present

## 2021-10-05 DIAGNOSIS — Z955 Presence of coronary angioplasty implant and graft: Secondary | ICD-10-CM | POA: Diagnosis not present

## 2021-10-05 DIAGNOSIS — I69354 Hemiplegia and hemiparesis following cerebral infarction affecting left non-dominant side: Secondary | ICD-10-CM | POA: Diagnosis not present

## 2021-10-05 DIAGNOSIS — I110 Hypertensive heart disease with heart failure: Secondary | ICD-10-CM | POA: Diagnosis not present

## 2021-10-05 DIAGNOSIS — I2102 ST elevation (STEMI) myocardial infarction involving left anterior descending coronary artery: Secondary | ICD-10-CM | POA: Diagnosis not present

## 2021-10-05 DIAGNOSIS — I6930 Unspecified sequelae of cerebral infarction: Secondary | ICD-10-CM | POA: Diagnosis not present

## 2021-10-05 DIAGNOSIS — I69351 Hemiplegia and hemiparesis following cerebral infarction affecting right dominant side: Secondary | ICD-10-CM | POA: Diagnosis not present

## 2021-10-05 DIAGNOSIS — I5043 Acute on chronic combined systolic (congestive) and diastolic (congestive) heart failure: Secondary | ICD-10-CM | POA: Diagnosis not present

## 2021-10-05 DIAGNOSIS — I69391 Dysphagia following cerebral infarction: Secondary | ICD-10-CM | POA: Diagnosis not present

## 2021-10-05 DIAGNOSIS — E782 Mixed hyperlipidemia: Secondary | ICD-10-CM | POA: Diagnosis not present

## 2021-10-07 DIAGNOSIS — H905 Unspecified sensorineural hearing loss: Secondary | ICD-10-CM | POA: Diagnosis not present

## 2021-10-07 DIAGNOSIS — I10 Essential (primary) hypertension: Secondary | ICD-10-CM | POA: Diagnosis not present

## 2021-10-08 DIAGNOSIS — I110 Hypertensive heart disease with heart failure: Secondary | ICD-10-CM | POA: Diagnosis not present

## 2021-10-08 DIAGNOSIS — I509 Heart failure, unspecified: Secondary | ICD-10-CM | POA: Diagnosis not present

## 2021-10-08 DIAGNOSIS — I69828 Other speech and language deficits following other cerebrovascular disease: Secondary | ICD-10-CM | POA: Diagnosis not present

## 2021-10-08 DIAGNOSIS — I69398 Other sequelae of cerebral infarction: Secondary | ICD-10-CM | POA: Diagnosis not present

## 2021-10-08 DIAGNOSIS — M6281 Muscle weakness (generalized): Secondary | ICD-10-CM | POA: Diagnosis not present

## 2021-10-08 DIAGNOSIS — I69013 Psychomotor deficit following nontraumatic subarachnoid hemorrhage: Secondary | ICD-10-CM | POA: Diagnosis not present

## 2021-10-08 DIAGNOSIS — I69351 Hemiplegia and hemiparesis following cerebral infarction affecting right dominant side: Secondary | ICD-10-CM | POA: Diagnosis not present

## 2021-10-08 DIAGNOSIS — R1312 Dysphagia, oropharyngeal phase: Secondary | ICD-10-CM | POA: Diagnosis not present

## 2021-10-08 DIAGNOSIS — I69354 Hemiplegia and hemiparesis following cerebral infarction affecting left non-dominant side: Secondary | ICD-10-CM | POA: Diagnosis not present

## 2021-10-08 DIAGNOSIS — I69391 Dysphagia following cerebral infarction: Secondary | ICD-10-CM | POA: Diagnosis not present

## 2021-10-11 DIAGNOSIS — I69013 Psychomotor deficit following nontraumatic subarachnoid hemorrhage: Secondary | ICD-10-CM | POA: Diagnosis not present

## 2021-10-11 DIAGNOSIS — I69391 Dysphagia following cerebral infarction: Secondary | ICD-10-CM | POA: Diagnosis not present

## 2021-10-11 DIAGNOSIS — I69354 Hemiplegia and hemiparesis following cerebral infarction affecting left non-dominant side: Secondary | ICD-10-CM | POA: Diagnosis not present

## 2021-10-11 DIAGNOSIS — I69351 Hemiplegia and hemiparesis following cerebral infarction affecting right dominant side: Secondary | ICD-10-CM | POA: Diagnosis not present

## 2021-10-11 DIAGNOSIS — I110 Hypertensive heart disease with heart failure: Secondary | ICD-10-CM | POA: Diagnosis not present

## 2021-10-11 DIAGNOSIS — I69398 Other sequelae of cerebral infarction: Secondary | ICD-10-CM | POA: Diagnosis not present

## 2021-10-11 DIAGNOSIS — R1312 Dysphagia, oropharyngeal phase: Secondary | ICD-10-CM | POA: Diagnosis not present

## 2021-10-11 DIAGNOSIS — I509 Heart failure, unspecified: Secondary | ICD-10-CM | POA: Diagnosis not present

## 2021-10-11 DIAGNOSIS — I69828 Other speech and language deficits following other cerebrovascular disease: Secondary | ICD-10-CM | POA: Diagnosis not present

## 2021-10-11 DIAGNOSIS — M6281 Muscle weakness (generalized): Secondary | ICD-10-CM | POA: Diagnosis not present

## 2021-10-12 DIAGNOSIS — I69828 Other speech and language deficits following other cerebrovascular disease: Secondary | ICD-10-CM | POA: Diagnosis not present

## 2021-10-12 DIAGNOSIS — I69013 Psychomotor deficit following nontraumatic subarachnoid hemorrhage: Secondary | ICD-10-CM | POA: Diagnosis not present

## 2021-10-12 DIAGNOSIS — M6281 Muscle weakness (generalized): Secondary | ICD-10-CM | POA: Diagnosis not present

## 2021-10-12 DIAGNOSIS — I509 Heart failure, unspecified: Secondary | ICD-10-CM | POA: Diagnosis not present

## 2021-10-12 DIAGNOSIS — R1312 Dysphagia, oropharyngeal phase: Secondary | ICD-10-CM | POA: Diagnosis not present

## 2021-10-12 DIAGNOSIS — I69391 Dysphagia following cerebral infarction: Secondary | ICD-10-CM | POA: Diagnosis not present

## 2021-10-12 DIAGNOSIS — I69398 Other sequelae of cerebral infarction: Secondary | ICD-10-CM | POA: Diagnosis not present

## 2021-10-12 DIAGNOSIS — I69351 Hemiplegia and hemiparesis following cerebral infarction affecting right dominant side: Secondary | ICD-10-CM | POA: Diagnosis not present

## 2021-10-12 DIAGNOSIS — I110 Hypertensive heart disease with heart failure: Secondary | ICD-10-CM | POA: Diagnosis not present

## 2021-10-12 DIAGNOSIS — I69354 Hemiplegia and hemiparesis following cerebral infarction affecting left non-dominant side: Secondary | ICD-10-CM | POA: Diagnosis not present

## 2021-10-15 DIAGNOSIS — I69398 Other sequelae of cerebral infarction: Secondary | ICD-10-CM | POA: Diagnosis not present

## 2021-10-15 DIAGNOSIS — R1312 Dysphagia, oropharyngeal phase: Secondary | ICD-10-CM | POA: Diagnosis not present

## 2021-10-15 DIAGNOSIS — I509 Heart failure, unspecified: Secondary | ICD-10-CM | POA: Diagnosis not present

## 2021-10-15 DIAGNOSIS — I69828 Other speech and language deficits following other cerebrovascular disease: Secondary | ICD-10-CM | POA: Diagnosis not present

## 2021-10-15 DIAGNOSIS — I69391 Dysphagia following cerebral infarction: Secondary | ICD-10-CM | POA: Diagnosis not present

## 2021-10-15 DIAGNOSIS — I69351 Hemiplegia and hemiparesis following cerebral infarction affecting right dominant side: Secondary | ICD-10-CM | POA: Diagnosis not present

## 2021-10-15 DIAGNOSIS — I110 Hypertensive heart disease with heart failure: Secondary | ICD-10-CM | POA: Diagnosis not present

## 2021-10-15 DIAGNOSIS — I69354 Hemiplegia and hemiparesis following cerebral infarction affecting left non-dominant side: Secondary | ICD-10-CM | POA: Diagnosis not present

## 2021-10-15 DIAGNOSIS — M6281 Muscle weakness (generalized): Secondary | ICD-10-CM | POA: Diagnosis not present

## 2021-10-15 DIAGNOSIS — I69013 Psychomotor deficit following nontraumatic subarachnoid hemorrhage: Secondary | ICD-10-CM | POA: Diagnosis not present

## 2021-10-18 DIAGNOSIS — I69828 Other speech and language deficits following other cerebrovascular disease: Secondary | ICD-10-CM | POA: Diagnosis not present

## 2021-10-18 DIAGNOSIS — M6281 Muscle weakness (generalized): Secondary | ICD-10-CM | POA: Diagnosis not present

## 2021-10-18 DIAGNOSIS — I69351 Hemiplegia and hemiparesis following cerebral infarction affecting right dominant side: Secondary | ICD-10-CM | POA: Diagnosis not present

## 2021-10-18 DIAGNOSIS — R1312 Dysphagia, oropharyngeal phase: Secondary | ICD-10-CM | POA: Diagnosis not present

## 2021-10-18 DIAGNOSIS — I69398 Other sequelae of cerebral infarction: Secondary | ICD-10-CM | POA: Diagnosis not present

## 2021-10-18 DIAGNOSIS — I110 Hypertensive heart disease with heart failure: Secondary | ICD-10-CM | POA: Diagnosis not present

## 2021-10-18 DIAGNOSIS — I69013 Psychomotor deficit following nontraumatic subarachnoid hemorrhage: Secondary | ICD-10-CM | POA: Diagnosis not present

## 2021-10-18 DIAGNOSIS — I69354 Hemiplegia and hemiparesis following cerebral infarction affecting left non-dominant side: Secondary | ICD-10-CM | POA: Diagnosis not present

## 2021-10-18 DIAGNOSIS — I509 Heart failure, unspecified: Secondary | ICD-10-CM | POA: Diagnosis not present

## 2021-10-18 DIAGNOSIS — I69391 Dysphagia following cerebral infarction: Secondary | ICD-10-CM | POA: Diagnosis not present

## 2021-10-19 DIAGNOSIS — I10 Essential (primary) hypertension: Secondary | ICD-10-CM | POA: Diagnosis not present

## 2021-10-19 DIAGNOSIS — N1831 Chronic kidney disease, stage 3a: Secondary | ICD-10-CM | POA: Diagnosis not present

## 2021-10-19 DIAGNOSIS — I639 Cerebral infarction, unspecified: Secondary | ICD-10-CM | POA: Diagnosis not present

## 2021-10-19 DIAGNOSIS — G8194 Hemiplegia, unspecified affecting left nondominant side: Secondary | ICD-10-CM | POA: Diagnosis not present

## 2021-10-19 DIAGNOSIS — K219 Gastro-esophageal reflux disease without esophagitis: Secondary | ICD-10-CM | POA: Diagnosis not present

## 2021-10-20 DIAGNOSIS — R1312 Dysphagia, oropharyngeal phase: Secondary | ICD-10-CM | POA: Diagnosis not present

## 2021-10-20 DIAGNOSIS — I69828 Other speech and language deficits following other cerebrovascular disease: Secondary | ICD-10-CM | POA: Diagnosis not present

## 2021-10-20 DIAGNOSIS — I69398 Other sequelae of cerebral infarction: Secondary | ICD-10-CM | POA: Diagnosis not present

## 2021-10-20 DIAGNOSIS — I69391 Dysphagia following cerebral infarction: Secondary | ICD-10-CM | POA: Diagnosis not present

## 2021-10-20 DIAGNOSIS — I69354 Hemiplegia and hemiparesis following cerebral infarction affecting left non-dominant side: Secondary | ICD-10-CM | POA: Diagnosis not present

## 2021-10-20 DIAGNOSIS — I509 Heart failure, unspecified: Secondary | ICD-10-CM | POA: Diagnosis not present

## 2021-10-20 DIAGNOSIS — I69351 Hemiplegia and hemiparesis following cerebral infarction affecting right dominant side: Secondary | ICD-10-CM | POA: Diagnosis not present

## 2021-10-20 DIAGNOSIS — M6281 Muscle weakness (generalized): Secondary | ICD-10-CM | POA: Diagnosis not present

## 2021-10-20 DIAGNOSIS — I69013 Psychomotor deficit following nontraumatic subarachnoid hemorrhage: Secondary | ICD-10-CM | POA: Diagnosis not present

## 2021-10-20 DIAGNOSIS — I110 Hypertensive heart disease with heart failure: Secondary | ICD-10-CM | POA: Diagnosis not present

## 2021-10-21 DIAGNOSIS — I639 Cerebral infarction, unspecified: Secondary | ICD-10-CM | POA: Diagnosis not present

## 2021-10-21 DIAGNOSIS — I219 Acute myocardial infarction, unspecified: Secondary | ICD-10-CM | POA: Diagnosis not present

## 2021-10-21 DIAGNOSIS — D518 Other vitamin B12 deficiency anemias: Secondary | ICD-10-CM | POA: Diagnosis not present

## 2021-10-21 DIAGNOSIS — E559 Vitamin D deficiency, unspecified: Secondary | ICD-10-CM | POA: Diagnosis not present

## 2021-10-21 DIAGNOSIS — E038 Other specified hypothyroidism: Secondary | ICD-10-CM | POA: Diagnosis not present

## 2021-10-21 DIAGNOSIS — I10 Essential (primary) hypertension: Secondary | ICD-10-CM | POA: Diagnosis not present

## 2021-10-21 DIAGNOSIS — E119 Type 2 diabetes mellitus without complications: Secondary | ICD-10-CM | POA: Diagnosis not present

## 2021-10-21 DIAGNOSIS — E782 Mixed hyperlipidemia: Secondary | ICD-10-CM | POA: Diagnosis not present

## 2021-10-26 DIAGNOSIS — I69391 Dysphagia following cerebral infarction: Secondary | ICD-10-CM | POA: Diagnosis not present

## 2021-10-26 DIAGNOSIS — I69398 Other sequelae of cerebral infarction: Secondary | ICD-10-CM | POA: Diagnosis not present

## 2021-10-26 DIAGNOSIS — R1312 Dysphagia, oropharyngeal phase: Secondary | ICD-10-CM | POA: Diagnosis not present

## 2021-10-26 DIAGNOSIS — I69354 Hemiplegia and hemiparesis following cerebral infarction affecting left non-dominant side: Secondary | ICD-10-CM | POA: Diagnosis not present

## 2021-10-26 DIAGNOSIS — I110 Hypertensive heart disease with heart failure: Secondary | ICD-10-CM | POA: Diagnosis not present

## 2021-10-26 DIAGNOSIS — I69013 Psychomotor deficit following nontraumatic subarachnoid hemorrhage: Secondary | ICD-10-CM | POA: Diagnosis not present

## 2021-10-26 DIAGNOSIS — I69351 Hemiplegia and hemiparesis following cerebral infarction affecting right dominant side: Secondary | ICD-10-CM | POA: Diagnosis not present

## 2021-10-26 DIAGNOSIS — I509 Heart failure, unspecified: Secondary | ICD-10-CM | POA: Diagnosis not present

## 2021-10-26 DIAGNOSIS — I69828 Other speech and language deficits following other cerebrovascular disease: Secondary | ICD-10-CM | POA: Diagnosis not present

## 2021-10-26 DIAGNOSIS — M6281 Muscle weakness (generalized): Secondary | ICD-10-CM | POA: Diagnosis not present

## 2021-10-27 DIAGNOSIS — I110 Hypertensive heart disease with heart failure: Secondary | ICD-10-CM | POA: Diagnosis not present

## 2021-10-27 DIAGNOSIS — I69013 Psychomotor deficit following nontraumatic subarachnoid hemorrhage: Secondary | ICD-10-CM | POA: Diagnosis not present

## 2021-10-27 DIAGNOSIS — I69828 Other speech and language deficits following other cerebrovascular disease: Secondary | ICD-10-CM | POA: Diagnosis not present

## 2021-10-27 DIAGNOSIS — M6281 Muscle weakness (generalized): Secondary | ICD-10-CM | POA: Diagnosis not present

## 2021-10-27 DIAGNOSIS — I509 Heart failure, unspecified: Secondary | ICD-10-CM | POA: Diagnosis not present

## 2021-10-27 DIAGNOSIS — I69354 Hemiplegia and hemiparesis following cerebral infarction affecting left non-dominant side: Secondary | ICD-10-CM | POA: Diagnosis not present

## 2021-10-27 DIAGNOSIS — R1312 Dysphagia, oropharyngeal phase: Secondary | ICD-10-CM | POA: Diagnosis not present

## 2021-10-27 DIAGNOSIS — I69398 Other sequelae of cerebral infarction: Secondary | ICD-10-CM | POA: Diagnosis not present

## 2021-10-27 DIAGNOSIS — I69391 Dysphagia following cerebral infarction: Secondary | ICD-10-CM | POA: Diagnosis not present

## 2021-10-27 DIAGNOSIS — I69351 Hemiplegia and hemiparesis following cerebral infarction affecting right dominant side: Secondary | ICD-10-CM | POA: Diagnosis not present

## 2021-10-27 DIAGNOSIS — J449 Chronic obstructive pulmonary disease, unspecified: Secondary | ICD-10-CM | POA: Diagnosis not present

## 2021-11-01 DIAGNOSIS — H6123 Impacted cerumen, bilateral: Secondary | ICD-10-CM | POA: Diagnosis not present

## 2021-11-01 DIAGNOSIS — H905 Unspecified sensorineural hearing loss: Secondary | ICD-10-CM | POA: Diagnosis not present

## 2021-11-02 DIAGNOSIS — I69828 Other speech and language deficits following other cerebrovascular disease: Secondary | ICD-10-CM | POA: Diagnosis not present

## 2021-11-02 DIAGNOSIS — M6281 Muscle weakness (generalized): Secondary | ICD-10-CM | POA: Diagnosis not present

## 2021-11-02 DIAGNOSIS — I69354 Hemiplegia and hemiparesis following cerebral infarction affecting left non-dominant side: Secondary | ICD-10-CM | POA: Diagnosis not present

## 2021-11-02 DIAGNOSIS — I69013 Psychomotor deficit following nontraumatic subarachnoid hemorrhage: Secondary | ICD-10-CM | POA: Diagnosis not present

## 2021-11-02 DIAGNOSIS — R1312 Dysphagia, oropharyngeal phase: Secondary | ICD-10-CM | POA: Diagnosis not present

## 2021-11-02 DIAGNOSIS — I69391 Dysphagia following cerebral infarction: Secondary | ICD-10-CM | POA: Diagnosis not present

## 2021-11-02 DIAGNOSIS — I69351 Hemiplegia and hemiparesis following cerebral infarction affecting right dominant side: Secondary | ICD-10-CM | POA: Diagnosis not present

## 2021-11-02 DIAGNOSIS — I69398 Other sequelae of cerebral infarction: Secondary | ICD-10-CM | POA: Diagnosis not present

## 2021-11-02 DIAGNOSIS — I509 Heart failure, unspecified: Secondary | ICD-10-CM | POA: Diagnosis not present

## 2021-11-02 DIAGNOSIS — I110 Hypertensive heart disease with heart failure: Secondary | ICD-10-CM | POA: Diagnosis not present

## 2021-11-03 DIAGNOSIS — I69398 Other sequelae of cerebral infarction: Secondary | ICD-10-CM | POA: Diagnosis not present

## 2021-11-03 DIAGNOSIS — I69351 Hemiplegia and hemiparesis following cerebral infarction affecting right dominant side: Secondary | ICD-10-CM | POA: Diagnosis not present

## 2021-11-03 DIAGNOSIS — I69354 Hemiplegia and hemiparesis following cerebral infarction affecting left non-dominant side: Secondary | ICD-10-CM | POA: Diagnosis not present

## 2021-11-03 DIAGNOSIS — I110 Hypertensive heart disease with heart failure: Secondary | ICD-10-CM | POA: Diagnosis not present

## 2021-11-03 DIAGNOSIS — I69013 Psychomotor deficit following nontraumatic subarachnoid hemorrhage: Secondary | ICD-10-CM | POA: Diagnosis not present

## 2021-11-03 DIAGNOSIS — I69828 Other speech and language deficits following other cerebrovascular disease: Secondary | ICD-10-CM | POA: Diagnosis not present

## 2021-11-03 DIAGNOSIS — M6281 Muscle weakness (generalized): Secondary | ICD-10-CM | POA: Diagnosis not present

## 2021-11-03 DIAGNOSIS — I509 Heart failure, unspecified: Secondary | ICD-10-CM | POA: Diagnosis not present

## 2021-11-03 DIAGNOSIS — R1312 Dysphagia, oropharyngeal phase: Secondary | ICD-10-CM | POA: Diagnosis not present

## 2021-11-03 DIAGNOSIS — I69391 Dysphagia following cerebral infarction: Secondary | ICD-10-CM | POA: Diagnosis not present

## 2021-11-04 DIAGNOSIS — I509 Heart failure, unspecified: Secondary | ICD-10-CM | POA: Diagnosis not present

## 2021-11-04 DIAGNOSIS — I69354 Hemiplegia and hemiparesis following cerebral infarction affecting left non-dominant side: Secondary | ICD-10-CM | POA: Diagnosis not present

## 2021-11-04 DIAGNOSIS — I110 Hypertensive heart disease with heart failure: Secondary | ICD-10-CM | POA: Diagnosis not present

## 2021-11-04 DIAGNOSIS — I69398 Other sequelae of cerebral infarction: Secondary | ICD-10-CM | POA: Diagnosis not present

## 2021-11-04 DIAGNOSIS — R1312 Dysphagia, oropharyngeal phase: Secondary | ICD-10-CM | POA: Diagnosis not present

## 2021-11-04 DIAGNOSIS — M6281 Muscle weakness (generalized): Secondary | ICD-10-CM | POA: Diagnosis not present

## 2021-11-04 DIAGNOSIS — I69351 Hemiplegia and hemiparesis following cerebral infarction affecting right dominant side: Secondary | ICD-10-CM | POA: Diagnosis not present

## 2021-11-04 DIAGNOSIS — I69391 Dysphagia following cerebral infarction: Secondary | ICD-10-CM | POA: Diagnosis not present

## 2021-11-04 DIAGNOSIS — I69828 Other speech and language deficits following other cerebrovascular disease: Secondary | ICD-10-CM | POA: Diagnosis not present

## 2021-11-04 DIAGNOSIS — I69013 Psychomotor deficit following nontraumatic subarachnoid hemorrhage: Secondary | ICD-10-CM | POA: Diagnosis not present

## 2021-11-07 DIAGNOSIS — I10 Essential (primary) hypertension: Secondary | ICD-10-CM | POA: Diagnosis not present

## 2021-11-08 DIAGNOSIS — M6281 Muscle weakness (generalized): Secondary | ICD-10-CM | POA: Diagnosis not present

## 2021-11-08 DIAGNOSIS — I69013 Psychomotor deficit following nontraumatic subarachnoid hemorrhage: Secondary | ICD-10-CM | POA: Diagnosis not present

## 2021-11-08 DIAGNOSIS — I69391 Dysphagia following cerebral infarction: Secondary | ICD-10-CM | POA: Diagnosis not present

## 2021-11-08 DIAGNOSIS — I110 Hypertensive heart disease with heart failure: Secondary | ICD-10-CM | POA: Diagnosis not present

## 2021-11-08 DIAGNOSIS — I69354 Hemiplegia and hemiparesis following cerebral infarction affecting left non-dominant side: Secondary | ICD-10-CM | POA: Diagnosis not present

## 2021-11-08 DIAGNOSIS — R1312 Dysphagia, oropharyngeal phase: Secondary | ICD-10-CM | POA: Diagnosis not present

## 2021-11-08 DIAGNOSIS — I509 Heart failure, unspecified: Secondary | ICD-10-CM | POA: Diagnosis not present

## 2021-11-08 DIAGNOSIS — I69351 Hemiplegia and hemiparesis following cerebral infarction affecting right dominant side: Secondary | ICD-10-CM | POA: Diagnosis not present

## 2021-11-08 DIAGNOSIS — I69828 Other speech and language deficits following other cerebrovascular disease: Secondary | ICD-10-CM | POA: Diagnosis not present

## 2021-11-08 DIAGNOSIS — I69398 Other sequelae of cerebral infarction: Secondary | ICD-10-CM | POA: Diagnosis not present

## 2021-11-09 DIAGNOSIS — G8194 Hemiplegia, unspecified affecting left nondominant side: Secondary | ICD-10-CM | POA: Diagnosis not present

## 2021-11-09 DIAGNOSIS — I509 Heart failure, unspecified: Secondary | ICD-10-CM | POA: Diagnosis not present

## 2021-11-09 DIAGNOSIS — R1312 Dysphagia, oropharyngeal phase: Secondary | ICD-10-CM | POA: Diagnosis not present

## 2021-11-09 DIAGNOSIS — I69354 Hemiplegia and hemiparesis following cerebral infarction affecting left non-dominant side: Secondary | ICD-10-CM | POA: Diagnosis not present

## 2021-11-09 DIAGNOSIS — I69351 Hemiplegia and hemiparesis following cerebral infarction affecting right dominant side: Secondary | ICD-10-CM | POA: Diagnosis not present

## 2021-11-09 DIAGNOSIS — I110 Hypertensive heart disease with heart failure: Secondary | ICD-10-CM | POA: Diagnosis not present

## 2021-11-09 DIAGNOSIS — I69391 Dysphagia following cerebral infarction: Secondary | ICD-10-CM | POA: Diagnosis not present

## 2021-11-09 DIAGNOSIS — I69398 Other sequelae of cerebral infarction: Secondary | ICD-10-CM | POA: Diagnosis not present

## 2021-11-09 DIAGNOSIS — I82409 Acute embolism and thrombosis of unspecified deep veins of unspecified lower extremity: Secondary | ICD-10-CM | POA: Diagnosis not present

## 2021-11-09 DIAGNOSIS — I69013 Psychomotor deficit following nontraumatic subarachnoid hemorrhage: Secondary | ICD-10-CM | POA: Diagnosis not present

## 2021-11-09 DIAGNOSIS — I639 Cerebral infarction, unspecified: Secondary | ICD-10-CM | POA: Diagnosis not present

## 2021-11-09 DIAGNOSIS — N1831 Chronic kidney disease, stage 3a: Secondary | ICD-10-CM | POA: Diagnosis not present

## 2021-11-09 DIAGNOSIS — I69828 Other speech and language deficits following other cerebrovascular disease: Secondary | ICD-10-CM | POA: Diagnosis not present

## 2021-11-09 DIAGNOSIS — M6281 Muscle weakness (generalized): Secondary | ICD-10-CM | POA: Diagnosis not present

## 2021-11-10 DIAGNOSIS — I110 Hypertensive heart disease with heart failure: Secondary | ICD-10-CM | POA: Diagnosis not present

## 2021-11-10 DIAGNOSIS — I69351 Hemiplegia and hemiparesis following cerebral infarction affecting right dominant side: Secondary | ICD-10-CM | POA: Diagnosis not present

## 2021-11-10 DIAGNOSIS — I69354 Hemiplegia and hemiparesis following cerebral infarction affecting left non-dominant side: Secondary | ICD-10-CM | POA: Diagnosis not present

## 2021-11-10 DIAGNOSIS — R1312 Dysphagia, oropharyngeal phase: Secondary | ICD-10-CM | POA: Diagnosis not present

## 2021-11-10 DIAGNOSIS — I69828 Other speech and language deficits following other cerebrovascular disease: Secondary | ICD-10-CM | POA: Diagnosis not present

## 2021-11-10 DIAGNOSIS — I69391 Dysphagia following cerebral infarction: Secondary | ICD-10-CM | POA: Diagnosis not present

## 2021-11-10 DIAGNOSIS — I509 Heart failure, unspecified: Secondary | ICD-10-CM | POA: Diagnosis not present

## 2021-11-10 DIAGNOSIS — M6281 Muscle weakness (generalized): Secondary | ICD-10-CM | POA: Diagnosis not present

## 2021-11-10 DIAGNOSIS — I69398 Other sequelae of cerebral infarction: Secondary | ICD-10-CM | POA: Diagnosis not present

## 2021-11-10 DIAGNOSIS — I69013 Psychomotor deficit following nontraumatic subarachnoid hemorrhage: Secondary | ICD-10-CM | POA: Diagnosis not present

## 2021-11-11 DIAGNOSIS — I110 Hypertensive heart disease with heart failure: Secondary | ICD-10-CM | POA: Diagnosis not present

## 2021-11-11 DIAGNOSIS — I69013 Psychomotor deficit following nontraumatic subarachnoid hemorrhage: Secondary | ICD-10-CM | POA: Diagnosis not present

## 2021-11-11 DIAGNOSIS — I69391 Dysphagia following cerebral infarction: Secondary | ICD-10-CM | POA: Diagnosis not present

## 2021-11-11 DIAGNOSIS — M6281 Muscle weakness (generalized): Secondary | ICD-10-CM | POA: Diagnosis not present

## 2021-11-11 DIAGNOSIS — I69828 Other speech and language deficits following other cerebrovascular disease: Secondary | ICD-10-CM | POA: Diagnosis not present

## 2021-11-11 DIAGNOSIS — I69351 Hemiplegia and hemiparesis following cerebral infarction affecting right dominant side: Secondary | ICD-10-CM | POA: Diagnosis not present

## 2021-11-11 DIAGNOSIS — R1312 Dysphagia, oropharyngeal phase: Secondary | ICD-10-CM | POA: Diagnosis not present

## 2021-11-11 DIAGNOSIS — I509 Heart failure, unspecified: Secondary | ICD-10-CM | POA: Diagnosis not present

## 2021-11-11 DIAGNOSIS — I69398 Other sequelae of cerebral infarction: Secondary | ICD-10-CM | POA: Diagnosis not present

## 2021-11-11 DIAGNOSIS — I69354 Hemiplegia and hemiparesis following cerebral infarction affecting left non-dominant side: Secondary | ICD-10-CM | POA: Diagnosis not present

## 2021-11-16 DIAGNOSIS — D518 Other vitamin B12 deficiency anemias: Secondary | ICD-10-CM | POA: Diagnosis not present

## 2021-11-16 DIAGNOSIS — E782 Mixed hyperlipidemia: Secondary | ICD-10-CM | POA: Diagnosis not present

## 2021-11-16 DIAGNOSIS — I110 Hypertensive heart disease with heart failure: Secondary | ICD-10-CM | POA: Diagnosis not present

## 2021-11-16 DIAGNOSIS — Z79899 Other long term (current) drug therapy: Secondary | ICD-10-CM | POA: Diagnosis not present

## 2021-11-16 DIAGNOSIS — I69013 Psychomotor deficit following nontraumatic subarachnoid hemorrhage: Secondary | ICD-10-CM | POA: Diagnosis not present

## 2021-11-16 DIAGNOSIS — E119 Type 2 diabetes mellitus without complications: Secondary | ICD-10-CM | POA: Diagnosis not present

## 2021-11-16 DIAGNOSIS — I509 Heart failure, unspecified: Secondary | ICD-10-CM | POA: Diagnosis not present

## 2021-11-16 DIAGNOSIS — E038 Other specified hypothyroidism: Secondary | ICD-10-CM | POA: Diagnosis not present

## 2021-11-16 DIAGNOSIS — I69828 Other speech and language deficits following other cerebrovascular disease: Secondary | ICD-10-CM | POA: Diagnosis not present

## 2021-11-16 DIAGNOSIS — R1312 Dysphagia, oropharyngeal phase: Secondary | ICD-10-CM | POA: Diagnosis not present

## 2021-11-16 DIAGNOSIS — I69354 Hemiplegia and hemiparesis following cerebral infarction affecting left non-dominant side: Secondary | ICD-10-CM | POA: Diagnosis not present

## 2021-11-16 DIAGNOSIS — I69398 Other sequelae of cerebral infarction: Secondary | ICD-10-CM | POA: Diagnosis not present

## 2021-11-16 DIAGNOSIS — M6281 Muscle weakness (generalized): Secondary | ICD-10-CM | POA: Diagnosis not present

## 2021-11-16 DIAGNOSIS — I69391 Dysphagia following cerebral infarction: Secondary | ICD-10-CM | POA: Diagnosis not present

## 2021-11-16 DIAGNOSIS — I69351 Hemiplegia and hemiparesis following cerebral infarction affecting right dominant side: Secondary | ICD-10-CM | POA: Diagnosis not present

## 2021-11-17 DIAGNOSIS — I69351 Hemiplegia and hemiparesis following cerebral infarction affecting right dominant side: Secondary | ICD-10-CM | POA: Diagnosis not present

## 2021-11-17 DIAGNOSIS — R1312 Dysphagia, oropharyngeal phase: Secondary | ICD-10-CM | POA: Diagnosis not present

## 2021-11-17 DIAGNOSIS — I69828 Other speech and language deficits following other cerebrovascular disease: Secondary | ICD-10-CM | POA: Diagnosis not present

## 2021-11-17 DIAGNOSIS — I69391 Dysphagia following cerebral infarction: Secondary | ICD-10-CM | POA: Diagnosis not present

## 2021-11-17 DIAGNOSIS — I509 Heart failure, unspecified: Secondary | ICD-10-CM | POA: Diagnosis not present

## 2021-11-17 DIAGNOSIS — M6281 Muscle weakness (generalized): Secondary | ICD-10-CM | POA: Diagnosis not present

## 2021-11-17 DIAGNOSIS — I69398 Other sequelae of cerebral infarction: Secondary | ICD-10-CM | POA: Diagnosis not present

## 2021-11-17 DIAGNOSIS — I69354 Hemiplegia and hemiparesis following cerebral infarction affecting left non-dominant side: Secondary | ICD-10-CM | POA: Diagnosis not present

## 2021-11-17 DIAGNOSIS — I69013 Psychomotor deficit following nontraumatic subarachnoid hemorrhage: Secondary | ICD-10-CM | POA: Diagnosis not present

## 2021-11-17 DIAGNOSIS — I110 Hypertensive heart disease with heart failure: Secondary | ICD-10-CM | POA: Diagnosis not present

## 2021-11-18 DIAGNOSIS — M6281 Muscle weakness (generalized): Secondary | ICD-10-CM | POA: Diagnosis not present

## 2021-11-18 DIAGNOSIS — I69398 Other sequelae of cerebral infarction: Secondary | ICD-10-CM | POA: Diagnosis not present

## 2021-11-18 DIAGNOSIS — I69828 Other speech and language deficits following other cerebrovascular disease: Secondary | ICD-10-CM | POA: Diagnosis not present

## 2021-11-18 DIAGNOSIS — I69351 Hemiplegia and hemiparesis following cerebral infarction affecting right dominant side: Secondary | ICD-10-CM | POA: Diagnosis not present

## 2021-11-18 DIAGNOSIS — I69354 Hemiplegia and hemiparesis following cerebral infarction affecting left non-dominant side: Secondary | ICD-10-CM | POA: Diagnosis not present

## 2021-11-18 DIAGNOSIS — R1312 Dysphagia, oropharyngeal phase: Secondary | ICD-10-CM | POA: Diagnosis not present

## 2021-11-18 DIAGNOSIS — I69013 Psychomotor deficit following nontraumatic subarachnoid hemorrhage: Secondary | ICD-10-CM | POA: Diagnosis not present

## 2021-11-18 DIAGNOSIS — I110 Hypertensive heart disease with heart failure: Secondary | ICD-10-CM | POA: Diagnosis not present

## 2021-11-18 DIAGNOSIS — I509 Heart failure, unspecified: Secondary | ICD-10-CM | POA: Diagnosis not present

## 2021-11-18 DIAGNOSIS — I69391 Dysphagia following cerebral infarction: Secondary | ICD-10-CM | POA: Diagnosis not present

## 2021-11-19 DIAGNOSIS — I69013 Psychomotor deficit following nontraumatic subarachnoid hemorrhage: Secondary | ICD-10-CM | POA: Diagnosis not present

## 2021-11-19 DIAGNOSIS — I69828 Other speech and language deficits following other cerebrovascular disease: Secondary | ICD-10-CM | POA: Diagnosis not present

## 2021-11-19 DIAGNOSIS — I110 Hypertensive heart disease with heart failure: Secondary | ICD-10-CM | POA: Diagnosis not present

## 2021-11-19 DIAGNOSIS — I69354 Hemiplegia and hemiparesis following cerebral infarction affecting left non-dominant side: Secondary | ICD-10-CM | POA: Diagnosis not present

## 2021-11-19 DIAGNOSIS — I69351 Hemiplegia and hemiparesis following cerebral infarction affecting right dominant side: Secondary | ICD-10-CM | POA: Diagnosis not present

## 2021-11-19 DIAGNOSIS — R1312 Dysphagia, oropharyngeal phase: Secondary | ICD-10-CM | POA: Diagnosis not present

## 2021-11-19 DIAGNOSIS — I509 Heart failure, unspecified: Secondary | ICD-10-CM | POA: Diagnosis not present

## 2021-11-19 DIAGNOSIS — I69398 Other sequelae of cerebral infarction: Secondary | ICD-10-CM | POA: Diagnosis not present

## 2021-11-19 DIAGNOSIS — I69391 Dysphagia following cerebral infarction: Secondary | ICD-10-CM | POA: Diagnosis not present

## 2021-11-19 DIAGNOSIS — M6281 Muscle weakness (generalized): Secondary | ICD-10-CM | POA: Diagnosis not present

## 2021-11-20 DIAGNOSIS — I639 Cerebral infarction, unspecified: Secondary | ICD-10-CM | POA: Diagnosis not present

## 2021-11-20 DIAGNOSIS — D518 Other vitamin B12 deficiency anemias: Secondary | ICD-10-CM | POA: Diagnosis not present

## 2021-11-20 DIAGNOSIS — E782 Mixed hyperlipidemia: Secondary | ICD-10-CM | POA: Diagnosis not present

## 2021-11-20 DIAGNOSIS — E559 Vitamin D deficiency, unspecified: Secondary | ICD-10-CM | POA: Diagnosis not present

## 2021-11-20 DIAGNOSIS — E038 Other specified hypothyroidism: Secondary | ICD-10-CM | POA: Diagnosis not present

## 2021-11-20 DIAGNOSIS — E119 Type 2 diabetes mellitus without complications: Secondary | ICD-10-CM | POA: Diagnosis not present

## 2021-11-20 DIAGNOSIS — I10 Essential (primary) hypertension: Secondary | ICD-10-CM | POA: Diagnosis not present

## 2021-11-20 DIAGNOSIS — I219 Acute myocardial infarction, unspecified: Secondary | ICD-10-CM | POA: Diagnosis not present

## 2021-11-23 DIAGNOSIS — I69013 Psychomotor deficit following nontraumatic subarachnoid hemorrhage: Secondary | ICD-10-CM | POA: Diagnosis not present

## 2021-11-23 DIAGNOSIS — M6281 Muscle weakness (generalized): Secondary | ICD-10-CM | POA: Diagnosis not present

## 2021-11-23 DIAGNOSIS — R1312 Dysphagia, oropharyngeal phase: Secondary | ICD-10-CM | POA: Diagnosis not present

## 2021-11-23 DIAGNOSIS — I69391 Dysphagia following cerebral infarction: Secondary | ICD-10-CM | POA: Diagnosis not present

## 2021-11-23 DIAGNOSIS — I509 Heart failure, unspecified: Secondary | ICD-10-CM | POA: Diagnosis not present

## 2021-11-23 DIAGNOSIS — I69398 Other sequelae of cerebral infarction: Secondary | ICD-10-CM | POA: Diagnosis not present

## 2021-11-23 DIAGNOSIS — I69828 Other speech and language deficits following other cerebrovascular disease: Secondary | ICD-10-CM | POA: Diagnosis not present

## 2021-11-23 DIAGNOSIS — I110 Hypertensive heart disease with heart failure: Secondary | ICD-10-CM | POA: Diagnosis not present

## 2021-11-23 DIAGNOSIS — I69351 Hemiplegia and hemiparesis following cerebral infarction affecting right dominant side: Secondary | ICD-10-CM | POA: Diagnosis not present

## 2021-11-23 DIAGNOSIS — I69354 Hemiplegia and hemiparesis following cerebral infarction affecting left non-dominant side: Secondary | ICD-10-CM | POA: Diagnosis not present

## 2021-11-26 DIAGNOSIS — I509 Heart failure, unspecified: Secondary | ICD-10-CM | POA: Diagnosis not present

## 2021-11-26 DIAGNOSIS — I69391 Dysphagia following cerebral infarction: Secondary | ICD-10-CM | POA: Diagnosis not present

## 2021-11-26 DIAGNOSIS — U071 COVID-19: Secondary | ICD-10-CM | POA: Diagnosis not present

## 2021-11-26 DIAGNOSIS — I110 Hypertensive heart disease with heart failure: Secondary | ICD-10-CM | POA: Diagnosis not present

## 2021-11-26 DIAGNOSIS — I69354 Hemiplegia and hemiparesis following cerebral infarction affecting left non-dominant side: Secondary | ICD-10-CM | POA: Diagnosis not present

## 2021-11-26 DIAGNOSIS — I69013 Psychomotor deficit following nontraumatic subarachnoid hemorrhage: Secondary | ICD-10-CM | POA: Diagnosis not present

## 2021-11-26 DIAGNOSIS — I69351 Hemiplegia and hemiparesis following cerebral infarction affecting right dominant side: Secondary | ICD-10-CM | POA: Diagnosis not present

## 2021-11-26 DIAGNOSIS — M6281 Muscle weakness (generalized): Secondary | ICD-10-CM | POA: Diagnosis not present

## 2021-11-26 DIAGNOSIS — R1312 Dysphagia, oropharyngeal phase: Secondary | ICD-10-CM | POA: Diagnosis not present

## 2021-11-26 DIAGNOSIS — I69398 Other sequelae of cerebral infarction: Secondary | ICD-10-CM | POA: Diagnosis not present

## 2021-11-27 DIAGNOSIS — J449 Chronic obstructive pulmonary disease, unspecified: Secondary | ICD-10-CM | POA: Diagnosis not present

## 2021-11-27 DIAGNOSIS — I69354 Hemiplegia and hemiparesis following cerebral infarction affecting left non-dominant side: Secondary | ICD-10-CM | POA: Diagnosis not present

## 2021-11-29 DIAGNOSIS — I110 Hypertensive heart disease with heart failure: Secondary | ICD-10-CM | POA: Diagnosis not present

## 2021-11-29 DIAGNOSIS — I69391 Dysphagia following cerebral infarction: Secondary | ICD-10-CM | POA: Diagnosis not present

## 2021-11-29 DIAGNOSIS — M6281 Muscle weakness (generalized): Secondary | ICD-10-CM | POA: Diagnosis not present

## 2021-11-29 DIAGNOSIS — I69351 Hemiplegia and hemiparesis following cerebral infarction affecting right dominant side: Secondary | ICD-10-CM | POA: Diagnosis not present

## 2021-11-29 DIAGNOSIS — I69354 Hemiplegia and hemiparesis following cerebral infarction affecting left non-dominant side: Secondary | ICD-10-CM | POA: Diagnosis not present

## 2021-11-29 DIAGNOSIS — I69398 Other sequelae of cerebral infarction: Secondary | ICD-10-CM | POA: Diagnosis not present

## 2021-11-29 DIAGNOSIS — U071 COVID-19: Secondary | ICD-10-CM | POA: Diagnosis not present

## 2021-11-29 DIAGNOSIS — R1312 Dysphagia, oropharyngeal phase: Secondary | ICD-10-CM | POA: Diagnosis not present

## 2021-11-29 DIAGNOSIS — I509 Heart failure, unspecified: Secondary | ICD-10-CM | POA: Diagnosis not present

## 2021-11-29 DIAGNOSIS — I69013 Psychomotor deficit following nontraumatic subarachnoid hemorrhage: Secondary | ICD-10-CM | POA: Diagnosis not present

## 2021-11-30 DIAGNOSIS — I69354 Hemiplegia and hemiparesis following cerebral infarction affecting left non-dominant side: Secondary | ICD-10-CM | POA: Diagnosis not present

## 2021-11-30 DIAGNOSIS — R1312 Dysphagia, oropharyngeal phase: Secondary | ICD-10-CM | POA: Diagnosis not present

## 2021-11-30 DIAGNOSIS — M6281 Muscle weakness (generalized): Secondary | ICD-10-CM | POA: Diagnosis not present

## 2021-11-30 DIAGNOSIS — I509 Heart failure, unspecified: Secondary | ICD-10-CM | POA: Diagnosis not present

## 2021-11-30 DIAGNOSIS — U071 COVID-19: Secondary | ICD-10-CM | POA: Diagnosis not present

## 2021-11-30 DIAGNOSIS — I69398 Other sequelae of cerebral infarction: Secondary | ICD-10-CM | POA: Diagnosis not present

## 2021-11-30 DIAGNOSIS — I110 Hypertensive heart disease with heart failure: Secondary | ICD-10-CM | POA: Diagnosis not present

## 2021-11-30 DIAGNOSIS — I69351 Hemiplegia and hemiparesis following cerebral infarction affecting right dominant side: Secondary | ICD-10-CM | POA: Diagnosis not present

## 2021-11-30 DIAGNOSIS — I69391 Dysphagia following cerebral infarction: Secondary | ICD-10-CM | POA: Diagnosis not present

## 2021-11-30 DIAGNOSIS — I69013 Psychomotor deficit following nontraumatic subarachnoid hemorrhage: Secondary | ICD-10-CM | POA: Diagnosis not present

## 2021-12-02 DIAGNOSIS — I69351 Hemiplegia and hemiparesis following cerebral infarction affecting right dominant side: Secondary | ICD-10-CM | POA: Diagnosis not present

## 2021-12-02 DIAGNOSIS — I509 Heart failure, unspecified: Secondary | ICD-10-CM | POA: Diagnosis not present

## 2021-12-02 DIAGNOSIS — R1312 Dysphagia, oropharyngeal phase: Secondary | ICD-10-CM | POA: Diagnosis not present

## 2021-12-02 DIAGNOSIS — U071 COVID-19: Secondary | ICD-10-CM | POA: Diagnosis not present

## 2021-12-02 DIAGNOSIS — I69354 Hemiplegia and hemiparesis following cerebral infarction affecting left non-dominant side: Secondary | ICD-10-CM | POA: Diagnosis not present

## 2021-12-02 DIAGNOSIS — I69398 Other sequelae of cerebral infarction: Secondary | ICD-10-CM | POA: Diagnosis not present

## 2021-12-02 DIAGNOSIS — I69391 Dysphagia following cerebral infarction: Secondary | ICD-10-CM | POA: Diagnosis not present

## 2021-12-02 DIAGNOSIS — M6281 Muscle weakness (generalized): Secondary | ICD-10-CM | POA: Diagnosis not present

## 2021-12-02 DIAGNOSIS — I69013 Psychomotor deficit following nontraumatic subarachnoid hemorrhage: Secondary | ICD-10-CM | POA: Diagnosis not present

## 2021-12-02 DIAGNOSIS — I110 Hypertensive heart disease with heart failure: Secondary | ICD-10-CM | POA: Diagnosis not present

## 2021-12-06 DIAGNOSIS — I69351 Hemiplegia and hemiparesis following cerebral infarction affecting right dominant side: Secondary | ICD-10-CM | POA: Diagnosis not present

## 2021-12-06 DIAGNOSIS — I69354 Hemiplegia and hemiparesis following cerebral infarction affecting left non-dominant side: Secondary | ICD-10-CM | POA: Diagnosis not present

## 2021-12-06 DIAGNOSIS — M6281 Muscle weakness (generalized): Secondary | ICD-10-CM | POA: Diagnosis not present

## 2021-12-06 DIAGNOSIS — U071 COVID-19: Secondary | ICD-10-CM | POA: Diagnosis not present

## 2021-12-06 DIAGNOSIS — I69391 Dysphagia following cerebral infarction: Secondary | ICD-10-CM | POA: Diagnosis not present

## 2021-12-06 DIAGNOSIS — I69013 Psychomotor deficit following nontraumatic subarachnoid hemorrhage: Secondary | ICD-10-CM | POA: Diagnosis not present

## 2021-12-06 DIAGNOSIS — I110 Hypertensive heart disease with heart failure: Secondary | ICD-10-CM | POA: Diagnosis not present

## 2021-12-06 DIAGNOSIS — R1312 Dysphagia, oropharyngeal phase: Secondary | ICD-10-CM | POA: Diagnosis not present

## 2021-12-06 DIAGNOSIS — I69398 Other sequelae of cerebral infarction: Secondary | ICD-10-CM | POA: Diagnosis not present

## 2021-12-06 DIAGNOSIS — I509 Heart failure, unspecified: Secondary | ICD-10-CM | POA: Diagnosis not present

## 2021-12-07 DIAGNOSIS — I69391 Dysphagia following cerebral infarction: Secondary | ICD-10-CM | POA: Diagnosis not present

## 2021-12-07 DIAGNOSIS — I69398 Other sequelae of cerebral infarction: Secondary | ICD-10-CM | POA: Diagnosis not present

## 2021-12-07 DIAGNOSIS — I69354 Hemiplegia and hemiparesis following cerebral infarction affecting left non-dominant side: Secondary | ICD-10-CM | POA: Diagnosis not present

## 2021-12-07 DIAGNOSIS — I69351 Hemiplegia and hemiparesis following cerebral infarction affecting right dominant side: Secondary | ICD-10-CM | POA: Diagnosis not present

## 2021-12-07 DIAGNOSIS — I110 Hypertensive heart disease with heart failure: Secondary | ICD-10-CM | POA: Diagnosis not present

## 2021-12-07 DIAGNOSIS — M6281 Muscle weakness (generalized): Secondary | ICD-10-CM | POA: Diagnosis not present

## 2021-12-07 DIAGNOSIS — U071 COVID-19: Secondary | ICD-10-CM | POA: Diagnosis not present

## 2021-12-07 DIAGNOSIS — I69013 Psychomotor deficit following nontraumatic subarachnoid hemorrhage: Secondary | ICD-10-CM | POA: Diagnosis not present

## 2021-12-07 DIAGNOSIS — I509 Heart failure, unspecified: Secondary | ICD-10-CM | POA: Diagnosis not present

## 2021-12-07 DIAGNOSIS — R1312 Dysphagia, oropharyngeal phase: Secondary | ICD-10-CM | POA: Diagnosis not present

## 2021-12-08 DIAGNOSIS — M79671 Pain in right foot: Secondary | ICD-10-CM | POA: Diagnosis not present

## 2021-12-08 DIAGNOSIS — L600 Ingrowing nail: Secondary | ICD-10-CM | POA: Diagnosis not present

## 2021-12-08 DIAGNOSIS — M79672 Pain in left foot: Secondary | ICD-10-CM | POA: Diagnosis not present

## 2021-12-08 DIAGNOSIS — B351 Tinea unguium: Secondary | ICD-10-CM | POA: Diagnosis not present

## 2021-12-09 DIAGNOSIS — M6281 Muscle weakness (generalized): Secondary | ICD-10-CM | POA: Diagnosis not present

## 2021-12-09 DIAGNOSIS — I69354 Hemiplegia and hemiparesis following cerebral infarction affecting left non-dominant side: Secondary | ICD-10-CM | POA: Diagnosis not present

## 2021-12-09 DIAGNOSIS — U071 COVID-19: Secondary | ICD-10-CM | POA: Diagnosis not present

## 2021-12-09 DIAGNOSIS — I69013 Psychomotor deficit following nontraumatic subarachnoid hemorrhage: Secondary | ICD-10-CM | POA: Diagnosis not present

## 2021-12-09 DIAGNOSIS — I69398 Other sequelae of cerebral infarction: Secondary | ICD-10-CM | POA: Diagnosis not present

## 2021-12-09 DIAGNOSIS — I69351 Hemiplegia and hemiparesis following cerebral infarction affecting right dominant side: Secondary | ICD-10-CM | POA: Diagnosis not present

## 2021-12-09 DIAGNOSIS — I509 Heart failure, unspecified: Secondary | ICD-10-CM | POA: Diagnosis not present

## 2021-12-09 DIAGNOSIS — I69391 Dysphagia following cerebral infarction: Secondary | ICD-10-CM | POA: Diagnosis not present

## 2021-12-09 DIAGNOSIS — I110 Hypertensive heart disease with heart failure: Secondary | ICD-10-CM | POA: Diagnosis not present

## 2021-12-09 DIAGNOSIS — R1312 Dysphagia, oropharyngeal phase: Secondary | ICD-10-CM | POA: Diagnosis not present

## 2021-12-10 DIAGNOSIS — I69398 Other sequelae of cerebral infarction: Secondary | ICD-10-CM | POA: Diagnosis not present

## 2021-12-10 DIAGNOSIS — I509 Heart failure, unspecified: Secondary | ICD-10-CM | POA: Diagnosis not present

## 2021-12-10 DIAGNOSIS — U071 COVID-19: Secondary | ICD-10-CM | POA: Diagnosis not present

## 2021-12-10 DIAGNOSIS — I69391 Dysphagia following cerebral infarction: Secondary | ICD-10-CM | POA: Diagnosis not present

## 2021-12-10 DIAGNOSIS — I69351 Hemiplegia and hemiparesis following cerebral infarction affecting right dominant side: Secondary | ICD-10-CM | POA: Diagnosis not present

## 2021-12-10 DIAGNOSIS — I69354 Hemiplegia and hemiparesis following cerebral infarction affecting left non-dominant side: Secondary | ICD-10-CM | POA: Diagnosis not present

## 2021-12-10 DIAGNOSIS — R1312 Dysphagia, oropharyngeal phase: Secondary | ICD-10-CM | POA: Diagnosis not present

## 2021-12-10 DIAGNOSIS — I110 Hypertensive heart disease with heart failure: Secondary | ICD-10-CM | POA: Diagnosis not present

## 2021-12-10 DIAGNOSIS — M6281 Muscle weakness (generalized): Secondary | ICD-10-CM | POA: Diagnosis not present

## 2021-12-10 DIAGNOSIS — I69013 Psychomotor deficit following nontraumatic subarachnoid hemorrhage: Secondary | ICD-10-CM | POA: Diagnosis not present

## 2021-12-13 DIAGNOSIS — I69398 Other sequelae of cerebral infarction: Secondary | ICD-10-CM | POA: Diagnosis not present

## 2021-12-13 DIAGNOSIS — I69354 Hemiplegia and hemiparesis following cerebral infarction affecting left non-dominant side: Secondary | ICD-10-CM | POA: Diagnosis not present

## 2021-12-13 DIAGNOSIS — I69013 Psychomotor deficit following nontraumatic subarachnoid hemorrhage: Secondary | ICD-10-CM | POA: Diagnosis not present

## 2021-12-13 DIAGNOSIS — I69391 Dysphagia following cerebral infarction: Secondary | ICD-10-CM | POA: Diagnosis not present

## 2021-12-13 DIAGNOSIS — I110 Hypertensive heart disease with heart failure: Secondary | ICD-10-CM | POA: Diagnosis not present

## 2021-12-13 DIAGNOSIS — I69351 Hemiplegia and hemiparesis following cerebral infarction affecting right dominant side: Secondary | ICD-10-CM | POA: Diagnosis not present

## 2021-12-13 DIAGNOSIS — U071 COVID-19: Secondary | ICD-10-CM | POA: Diagnosis not present

## 2021-12-13 DIAGNOSIS — I509 Heart failure, unspecified: Secondary | ICD-10-CM | POA: Diagnosis not present

## 2021-12-13 DIAGNOSIS — R1312 Dysphagia, oropharyngeal phase: Secondary | ICD-10-CM | POA: Diagnosis not present

## 2021-12-13 DIAGNOSIS — M6281 Muscle weakness (generalized): Secondary | ICD-10-CM | POA: Diagnosis not present

## 2021-12-14 DIAGNOSIS — I639 Cerebral infarction, unspecified: Secondary | ICD-10-CM | POA: Diagnosis not present

## 2021-12-14 DIAGNOSIS — U071 COVID-19: Secondary | ICD-10-CM | POA: Diagnosis not present

## 2021-12-14 DIAGNOSIS — R1312 Dysphagia, oropharyngeal phase: Secondary | ICD-10-CM | POA: Diagnosis not present

## 2021-12-14 DIAGNOSIS — I509 Heart failure, unspecified: Secondary | ICD-10-CM | POA: Diagnosis not present

## 2021-12-14 DIAGNOSIS — I10 Essential (primary) hypertension: Secondary | ICD-10-CM | POA: Diagnosis not present

## 2021-12-14 DIAGNOSIS — I69354 Hemiplegia and hemiparesis following cerebral infarction affecting left non-dominant side: Secondary | ICD-10-CM | POA: Diagnosis not present

## 2021-12-14 DIAGNOSIS — I110 Hypertensive heart disease with heart failure: Secondary | ICD-10-CM | POA: Diagnosis not present

## 2021-12-14 DIAGNOSIS — N1831 Chronic kidney disease, stage 3a: Secondary | ICD-10-CM | POA: Diagnosis not present

## 2021-12-14 DIAGNOSIS — I69398 Other sequelae of cerebral infarction: Secondary | ICD-10-CM | POA: Diagnosis not present

## 2021-12-14 DIAGNOSIS — K219 Gastro-esophageal reflux disease without esophagitis: Secondary | ICD-10-CM | POA: Diagnosis not present

## 2021-12-14 DIAGNOSIS — M6281 Muscle weakness (generalized): Secondary | ICD-10-CM | POA: Diagnosis not present

## 2021-12-14 DIAGNOSIS — I69351 Hemiplegia and hemiparesis following cerebral infarction affecting right dominant side: Secondary | ICD-10-CM | POA: Diagnosis not present

## 2021-12-14 DIAGNOSIS — I739 Peripheral vascular disease, unspecified: Secondary | ICD-10-CM | POA: Diagnosis not present

## 2021-12-14 DIAGNOSIS — I69013 Psychomotor deficit following nontraumatic subarachnoid hemorrhage: Secondary | ICD-10-CM | POA: Diagnosis not present

## 2021-12-14 DIAGNOSIS — I69391 Dysphagia following cerebral infarction: Secondary | ICD-10-CM | POA: Diagnosis not present

## 2021-12-15 DIAGNOSIS — R1312 Dysphagia, oropharyngeal phase: Secondary | ICD-10-CM | POA: Diagnosis not present

## 2021-12-15 DIAGNOSIS — I69013 Psychomotor deficit following nontraumatic subarachnoid hemorrhage: Secondary | ICD-10-CM | POA: Diagnosis not present

## 2021-12-15 DIAGNOSIS — U071 COVID-19: Secondary | ICD-10-CM | POA: Diagnosis not present

## 2021-12-15 DIAGNOSIS — I69354 Hemiplegia and hemiparesis following cerebral infarction affecting left non-dominant side: Secondary | ICD-10-CM | POA: Diagnosis not present

## 2021-12-15 DIAGNOSIS — I69398 Other sequelae of cerebral infarction: Secondary | ICD-10-CM | POA: Diagnosis not present

## 2021-12-15 DIAGNOSIS — M6281 Muscle weakness (generalized): Secondary | ICD-10-CM | POA: Diagnosis not present

## 2021-12-15 DIAGNOSIS — I110 Hypertensive heart disease with heart failure: Secondary | ICD-10-CM | POA: Diagnosis not present

## 2021-12-15 DIAGNOSIS — I69351 Hemiplegia and hemiparesis following cerebral infarction affecting right dominant side: Secondary | ICD-10-CM | POA: Diagnosis not present

## 2021-12-15 DIAGNOSIS — I69391 Dysphagia following cerebral infarction: Secondary | ICD-10-CM | POA: Diagnosis not present

## 2021-12-15 DIAGNOSIS — I509 Heart failure, unspecified: Secondary | ICD-10-CM | POA: Diagnosis not present

## 2021-12-19 DIAGNOSIS — D518 Other vitamin B12 deficiency anemias: Secondary | ICD-10-CM | POA: Diagnosis not present

## 2021-12-19 DIAGNOSIS — I639 Cerebral infarction, unspecified: Secondary | ICD-10-CM | POA: Diagnosis not present

## 2021-12-19 DIAGNOSIS — E038 Other specified hypothyroidism: Secondary | ICD-10-CM | POA: Diagnosis not present

## 2021-12-19 DIAGNOSIS — I10 Essential (primary) hypertension: Secondary | ICD-10-CM | POA: Diagnosis not present

## 2021-12-19 DIAGNOSIS — E119 Type 2 diabetes mellitus without complications: Secondary | ICD-10-CM | POA: Diagnosis not present

## 2021-12-19 DIAGNOSIS — E782 Mixed hyperlipidemia: Secondary | ICD-10-CM | POA: Diagnosis not present

## 2021-12-19 DIAGNOSIS — E559 Vitamin D deficiency, unspecified: Secondary | ICD-10-CM | POA: Diagnosis not present

## 2021-12-19 DIAGNOSIS — I219 Acute myocardial infarction, unspecified: Secondary | ICD-10-CM | POA: Diagnosis not present

## 2021-12-21 DIAGNOSIS — I69013 Psychomotor deficit following nontraumatic subarachnoid hemorrhage: Secondary | ICD-10-CM | POA: Diagnosis not present

## 2021-12-21 DIAGNOSIS — M6281 Muscle weakness (generalized): Secondary | ICD-10-CM | POA: Diagnosis not present

## 2021-12-21 DIAGNOSIS — I110 Hypertensive heart disease with heart failure: Secondary | ICD-10-CM | POA: Diagnosis not present

## 2021-12-21 DIAGNOSIS — I69351 Hemiplegia and hemiparesis following cerebral infarction affecting right dominant side: Secondary | ICD-10-CM | POA: Diagnosis not present

## 2021-12-21 DIAGNOSIS — R1312 Dysphagia, oropharyngeal phase: Secondary | ICD-10-CM | POA: Diagnosis not present

## 2021-12-21 DIAGNOSIS — I69391 Dysphagia following cerebral infarction: Secondary | ICD-10-CM | POA: Diagnosis not present

## 2021-12-21 DIAGNOSIS — I69354 Hemiplegia and hemiparesis following cerebral infarction affecting left non-dominant side: Secondary | ICD-10-CM | POA: Diagnosis not present

## 2021-12-21 DIAGNOSIS — I509 Heart failure, unspecified: Secondary | ICD-10-CM | POA: Diagnosis not present

## 2021-12-21 DIAGNOSIS — U071 COVID-19: Secondary | ICD-10-CM | POA: Diagnosis not present

## 2021-12-21 DIAGNOSIS — I69398 Other sequelae of cerebral infarction: Secondary | ICD-10-CM | POA: Diagnosis not present

## 2021-12-28 DIAGNOSIS — I509 Heart failure, unspecified: Secondary | ICD-10-CM | POA: Diagnosis not present

## 2021-12-28 DIAGNOSIS — I69351 Hemiplegia and hemiparesis following cerebral infarction affecting right dominant side: Secondary | ICD-10-CM | POA: Diagnosis not present

## 2021-12-28 DIAGNOSIS — R1312 Dysphagia, oropharyngeal phase: Secondary | ICD-10-CM | POA: Diagnosis not present

## 2021-12-28 DIAGNOSIS — I69013 Psychomotor deficit following nontraumatic subarachnoid hemorrhage: Secondary | ICD-10-CM | POA: Diagnosis not present

## 2021-12-28 DIAGNOSIS — M6281 Muscle weakness (generalized): Secondary | ICD-10-CM | POA: Diagnosis not present

## 2021-12-28 DIAGNOSIS — U071 COVID-19: Secondary | ICD-10-CM | POA: Diagnosis not present

## 2021-12-28 DIAGNOSIS — I69354 Hemiplegia and hemiparesis following cerebral infarction affecting left non-dominant side: Secondary | ICD-10-CM | POA: Diagnosis not present

## 2021-12-28 DIAGNOSIS — I110 Hypertensive heart disease with heart failure: Secondary | ICD-10-CM | POA: Diagnosis not present

## 2021-12-28 DIAGNOSIS — I69398 Other sequelae of cerebral infarction: Secondary | ICD-10-CM | POA: Diagnosis not present

## 2021-12-28 DIAGNOSIS — I69391 Dysphagia following cerebral infarction: Secondary | ICD-10-CM | POA: Diagnosis not present

## 2021-12-29 DIAGNOSIS — U071 COVID-19: Secondary | ICD-10-CM | POA: Diagnosis not present

## 2021-12-29 DIAGNOSIS — M6281 Muscle weakness (generalized): Secondary | ICD-10-CM | POA: Diagnosis not present

## 2021-12-29 DIAGNOSIS — I69398 Other sequelae of cerebral infarction: Secondary | ICD-10-CM | POA: Diagnosis not present

## 2021-12-29 DIAGNOSIS — I509 Heart failure, unspecified: Secondary | ICD-10-CM | POA: Diagnosis not present

## 2021-12-29 DIAGNOSIS — I110 Hypertensive heart disease with heart failure: Secondary | ICD-10-CM | POA: Diagnosis not present

## 2021-12-29 DIAGNOSIS — I69013 Psychomotor deficit following nontraumatic subarachnoid hemorrhage: Secondary | ICD-10-CM | POA: Diagnosis not present

## 2021-12-29 DIAGNOSIS — I69351 Hemiplegia and hemiparesis following cerebral infarction affecting right dominant side: Secondary | ICD-10-CM | POA: Diagnosis not present

## 2021-12-29 DIAGNOSIS — I69391 Dysphagia following cerebral infarction: Secondary | ICD-10-CM | POA: Diagnosis not present

## 2021-12-29 DIAGNOSIS — R1312 Dysphagia, oropharyngeal phase: Secondary | ICD-10-CM | POA: Diagnosis not present

## 2021-12-29 DIAGNOSIS — I69354 Hemiplegia and hemiparesis following cerebral infarction affecting left non-dominant side: Secondary | ICD-10-CM | POA: Diagnosis not present

## 2022-01-03 DIAGNOSIS — M6281 Muscle weakness (generalized): Secondary | ICD-10-CM | POA: Diagnosis not present

## 2022-01-03 DIAGNOSIS — R1312 Dysphagia, oropharyngeal phase: Secondary | ICD-10-CM | POA: Diagnosis not present

## 2022-01-03 DIAGNOSIS — I69354 Hemiplegia and hemiparesis following cerebral infarction affecting left non-dominant side: Secondary | ICD-10-CM | POA: Diagnosis not present

## 2022-01-03 DIAGNOSIS — U071 COVID-19: Secondary | ICD-10-CM | POA: Diagnosis not present

## 2022-01-03 DIAGNOSIS — I110 Hypertensive heart disease with heart failure: Secondary | ICD-10-CM | POA: Diagnosis not present

## 2022-01-03 DIAGNOSIS — I509 Heart failure, unspecified: Secondary | ICD-10-CM | POA: Diagnosis not present

## 2022-01-03 DIAGNOSIS — I69398 Other sequelae of cerebral infarction: Secondary | ICD-10-CM | POA: Diagnosis not present

## 2022-01-03 DIAGNOSIS — I69391 Dysphagia following cerebral infarction: Secondary | ICD-10-CM | POA: Diagnosis not present

## 2022-01-03 DIAGNOSIS — I69351 Hemiplegia and hemiparesis following cerebral infarction affecting right dominant side: Secondary | ICD-10-CM | POA: Diagnosis not present

## 2022-01-03 DIAGNOSIS — I69013 Psychomotor deficit following nontraumatic subarachnoid hemorrhage: Secondary | ICD-10-CM | POA: Diagnosis not present

## 2022-01-06 DIAGNOSIS — R918 Other nonspecific abnormal finding of lung field: Secondary | ICD-10-CM | POA: Diagnosis not present

## 2022-01-07 DIAGNOSIS — R1312 Dysphagia, oropharyngeal phase: Secondary | ICD-10-CM | POA: Diagnosis not present

## 2022-01-07 DIAGNOSIS — I10 Essential (primary) hypertension: Secondary | ICD-10-CM | POA: Diagnosis not present

## 2022-01-07 DIAGNOSIS — I69398 Other sequelae of cerebral infarction: Secondary | ICD-10-CM | POA: Diagnosis not present

## 2022-01-07 DIAGNOSIS — U071 COVID-19: Secondary | ICD-10-CM | POA: Diagnosis not present

## 2022-01-07 DIAGNOSIS — I69354 Hemiplegia and hemiparesis following cerebral infarction affecting left non-dominant side: Secondary | ICD-10-CM | POA: Diagnosis not present

## 2022-01-07 DIAGNOSIS — M6281 Muscle weakness (generalized): Secondary | ICD-10-CM | POA: Diagnosis not present

## 2022-01-07 DIAGNOSIS — I69351 Hemiplegia and hemiparesis following cerebral infarction affecting right dominant side: Secondary | ICD-10-CM | POA: Diagnosis not present

## 2022-01-07 DIAGNOSIS — I110 Hypertensive heart disease with heart failure: Secondary | ICD-10-CM | POA: Diagnosis not present

## 2022-01-07 DIAGNOSIS — I509 Heart failure, unspecified: Secondary | ICD-10-CM | POA: Diagnosis not present

## 2022-01-07 DIAGNOSIS — I69013 Psychomotor deficit following nontraumatic subarachnoid hemorrhage: Secondary | ICD-10-CM | POA: Diagnosis not present

## 2022-01-07 DIAGNOSIS — I69391 Dysphagia following cerebral infarction: Secondary | ICD-10-CM | POA: Diagnosis not present

## 2022-01-11 DIAGNOSIS — K219 Gastro-esophageal reflux disease without esophagitis: Secondary | ICD-10-CM | POA: Diagnosis not present

## 2022-01-11 DIAGNOSIS — I110 Hypertensive heart disease with heart failure: Secondary | ICD-10-CM | POA: Diagnosis not present

## 2022-01-11 DIAGNOSIS — I69351 Hemiplegia and hemiparesis following cerebral infarction affecting right dominant side: Secondary | ICD-10-CM | POA: Diagnosis not present

## 2022-01-11 DIAGNOSIS — I69391 Dysphagia following cerebral infarction: Secondary | ICD-10-CM | POA: Diagnosis not present

## 2022-01-11 DIAGNOSIS — G8194 Hemiplegia, unspecified affecting left nondominant side: Secondary | ICD-10-CM | POA: Diagnosis not present

## 2022-01-11 DIAGNOSIS — I69398 Other sequelae of cerebral infarction: Secondary | ICD-10-CM | POA: Diagnosis not present

## 2022-01-11 DIAGNOSIS — U071 COVID-19: Secondary | ICD-10-CM | POA: Diagnosis not present

## 2022-01-11 DIAGNOSIS — I639 Cerebral infarction, unspecified: Secondary | ICD-10-CM | POA: Diagnosis not present

## 2022-01-11 DIAGNOSIS — M6281 Muscle weakness (generalized): Secondary | ICD-10-CM | POA: Diagnosis not present

## 2022-01-11 DIAGNOSIS — I69013 Psychomotor deficit following nontraumatic subarachnoid hemorrhage: Secondary | ICD-10-CM | POA: Diagnosis not present

## 2022-01-11 DIAGNOSIS — R1312 Dysphagia, oropharyngeal phase: Secondary | ICD-10-CM | POA: Diagnosis not present

## 2022-01-11 DIAGNOSIS — I69354 Hemiplegia and hemiparesis following cerebral infarction affecting left non-dominant side: Secondary | ICD-10-CM | POA: Diagnosis not present

## 2022-01-11 DIAGNOSIS — I509 Heart failure, unspecified: Secondary | ICD-10-CM | POA: Diagnosis not present

## 2022-01-14 DIAGNOSIS — I69391 Dysphagia following cerebral infarction: Secondary | ICD-10-CM | POA: Diagnosis not present

## 2022-01-14 DIAGNOSIS — M6281 Muscle weakness (generalized): Secondary | ICD-10-CM | POA: Diagnosis not present

## 2022-01-14 DIAGNOSIS — U071 COVID-19: Secondary | ICD-10-CM | POA: Diagnosis not present

## 2022-01-14 DIAGNOSIS — I69351 Hemiplegia and hemiparesis following cerebral infarction affecting right dominant side: Secondary | ICD-10-CM | POA: Diagnosis not present

## 2022-01-14 DIAGNOSIS — I110 Hypertensive heart disease with heart failure: Secondary | ICD-10-CM | POA: Diagnosis not present

## 2022-01-14 DIAGNOSIS — R1312 Dysphagia, oropharyngeal phase: Secondary | ICD-10-CM | POA: Diagnosis not present

## 2022-01-14 DIAGNOSIS — I69354 Hemiplegia and hemiparesis following cerebral infarction affecting left non-dominant side: Secondary | ICD-10-CM | POA: Diagnosis not present

## 2022-01-14 DIAGNOSIS — I509 Heart failure, unspecified: Secondary | ICD-10-CM | POA: Diagnosis not present

## 2022-01-14 DIAGNOSIS — I69013 Psychomotor deficit following nontraumatic subarachnoid hemorrhage: Secondary | ICD-10-CM | POA: Diagnosis not present

## 2022-01-14 DIAGNOSIS — I69398 Other sequelae of cerebral infarction: Secondary | ICD-10-CM | POA: Diagnosis not present

## 2022-01-18 DIAGNOSIS — E038 Other specified hypothyroidism: Secondary | ICD-10-CM | POA: Diagnosis not present

## 2022-01-18 DIAGNOSIS — E782 Mixed hyperlipidemia: Secondary | ICD-10-CM | POA: Diagnosis not present

## 2022-01-18 DIAGNOSIS — Z79899 Other long term (current) drug therapy: Secondary | ICD-10-CM | POA: Diagnosis not present

## 2022-01-18 DIAGNOSIS — E119 Type 2 diabetes mellitus without complications: Secondary | ICD-10-CM | POA: Diagnosis not present

## 2022-01-18 DIAGNOSIS — D518 Other vitamin B12 deficiency anemias: Secondary | ICD-10-CM | POA: Diagnosis not present

## 2022-01-19 DIAGNOSIS — I69391 Dysphagia following cerebral infarction: Secondary | ICD-10-CM | POA: Diagnosis not present

## 2022-01-19 DIAGNOSIS — U071 COVID-19: Secondary | ICD-10-CM | POA: Diagnosis not present

## 2022-01-19 DIAGNOSIS — I219 Acute myocardial infarction, unspecified: Secondary | ICD-10-CM | POA: Diagnosis not present

## 2022-01-19 DIAGNOSIS — E559 Vitamin D deficiency, unspecified: Secondary | ICD-10-CM | POA: Diagnosis not present

## 2022-01-19 DIAGNOSIS — I69351 Hemiplegia and hemiparesis following cerebral infarction affecting right dominant side: Secondary | ICD-10-CM | POA: Diagnosis not present

## 2022-01-19 DIAGNOSIS — I69354 Hemiplegia and hemiparesis following cerebral infarction affecting left non-dominant side: Secondary | ICD-10-CM | POA: Diagnosis not present

## 2022-01-19 DIAGNOSIS — I69013 Psychomotor deficit following nontraumatic subarachnoid hemorrhage: Secondary | ICD-10-CM | POA: Diagnosis not present

## 2022-01-19 DIAGNOSIS — M6281 Muscle weakness (generalized): Secondary | ICD-10-CM | POA: Diagnosis not present

## 2022-01-19 DIAGNOSIS — E038 Other specified hypothyroidism: Secondary | ICD-10-CM | POA: Diagnosis not present

## 2022-01-19 DIAGNOSIS — I69398 Other sequelae of cerebral infarction: Secondary | ICD-10-CM | POA: Diagnosis not present

## 2022-01-19 DIAGNOSIS — E119 Type 2 diabetes mellitus without complications: Secondary | ICD-10-CM | POA: Diagnosis not present

## 2022-01-19 DIAGNOSIS — E782 Mixed hyperlipidemia: Secondary | ICD-10-CM | POA: Diagnosis not present

## 2022-01-19 DIAGNOSIS — I639 Cerebral infarction, unspecified: Secondary | ICD-10-CM | POA: Diagnosis not present

## 2022-01-19 DIAGNOSIS — I110 Hypertensive heart disease with heart failure: Secondary | ICD-10-CM | POA: Diagnosis not present

## 2022-01-19 DIAGNOSIS — I10 Essential (primary) hypertension: Secondary | ICD-10-CM | POA: Diagnosis not present

## 2022-01-19 DIAGNOSIS — D518 Other vitamin B12 deficiency anemias: Secondary | ICD-10-CM | POA: Diagnosis not present

## 2022-01-19 DIAGNOSIS — I509 Heart failure, unspecified: Secondary | ICD-10-CM | POA: Diagnosis not present

## 2022-01-19 DIAGNOSIS — R1312 Dysphagia, oropharyngeal phase: Secondary | ICD-10-CM | POA: Diagnosis not present

## 2022-01-25 DIAGNOSIS — R011 Cardiac murmur, unspecified: Secondary | ICD-10-CM | POA: Diagnosis not present

## 2022-01-31 DIAGNOSIS — R0989 Other specified symptoms and signs involving the circulatory and respiratory systems: Secondary | ICD-10-CM | POA: Diagnosis not present

## 2022-01-31 DIAGNOSIS — I6523 Occlusion and stenosis of bilateral carotid arteries: Secondary | ICD-10-CM | POA: Diagnosis not present

## 2022-02-07 DIAGNOSIS — I10 Essential (primary) hypertension: Secondary | ICD-10-CM | POA: Diagnosis not present

## 2022-02-09 DIAGNOSIS — I77811 Abdominal aortic ectasia: Secondary | ICD-10-CM | POA: Diagnosis not present

## 2022-02-15 DIAGNOSIS — I10 Essential (primary) hypertension: Secondary | ICD-10-CM | POA: Diagnosis not present

## 2022-02-15 DIAGNOSIS — N1831 Chronic kidney disease, stage 3a: Secondary | ICD-10-CM | POA: Diagnosis not present

## 2022-02-15 DIAGNOSIS — G8194 Hemiplegia, unspecified affecting left nondominant side: Secondary | ICD-10-CM | POA: Diagnosis not present

## 2022-02-15 DIAGNOSIS — I639 Cerebral infarction, unspecified: Secondary | ICD-10-CM | POA: Diagnosis not present

## 2022-02-15 DIAGNOSIS — I82409 Acute embolism and thrombosis of unspecified deep veins of unspecified lower extremity: Secondary | ICD-10-CM | POA: Diagnosis not present

## 2022-02-15 DIAGNOSIS — K219 Gastro-esophageal reflux disease without esophagitis: Secondary | ICD-10-CM | POA: Diagnosis not present

## 2022-02-16 DIAGNOSIS — I70223 Atherosclerosis of native arteries of extremities with rest pain, bilateral legs: Secondary | ICD-10-CM | POA: Diagnosis not present

## 2022-03-08 DIAGNOSIS — I739 Peripheral vascular disease, unspecified: Secondary | ICD-10-CM | POA: Diagnosis not present

## 2022-03-08 DIAGNOSIS — I639 Cerebral infarction, unspecified: Secondary | ICD-10-CM | POA: Diagnosis not present

## 2022-03-08 DIAGNOSIS — I10 Essential (primary) hypertension: Secondary | ICD-10-CM | POA: Diagnosis not present

## 2022-03-08 DIAGNOSIS — K219 Gastro-esophageal reflux disease without esophagitis: Secondary | ICD-10-CM | POA: Diagnosis not present

## 2022-03-08 DIAGNOSIS — N1831 Chronic kidney disease, stage 3a: Secondary | ICD-10-CM | POA: Diagnosis not present

## 2022-03-09 DIAGNOSIS — I10 Essential (primary) hypertension: Secondary | ICD-10-CM | POA: Diagnosis not present

## 2022-03-10 DIAGNOSIS — I639 Cerebral infarction, unspecified: Secondary | ICD-10-CM | POA: Diagnosis not present

## 2022-03-10 DIAGNOSIS — I219 Acute myocardial infarction, unspecified: Secondary | ICD-10-CM | POA: Diagnosis not present

## 2022-03-10 DIAGNOSIS — E038 Other specified hypothyroidism: Secondary | ICD-10-CM | POA: Diagnosis not present

## 2022-03-10 DIAGNOSIS — I10 Essential (primary) hypertension: Secondary | ICD-10-CM | POA: Diagnosis not present

## 2022-03-10 DIAGNOSIS — E782 Mixed hyperlipidemia: Secondary | ICD-10-CM | POA: Diagnosis not present

## 2022-03-10 DIAGNOSIS — D518 Other vitamin B12 deficiency anemias: Secondary | ICD-10-CM | POA: Diagnosis not present

## 2022-03-10 DIAGNOSIS — E119 Type 2 diabetes mellitus without complications: Secondary | ICD-10-CM | POA: Diagnosis not present

## 2022-03-10 DIAGNOSIS — E559 Vitamin D deficiency, unspecified: Secondary | ICD-10-CM | POA: Diagnosis not present

## 2022-03-30 DIAGNOSIS — E559 Vitamin D deficiency, unspecified: Secondary | ICD-10-CM | POA: Diagnosis not present

## 2022-03-30 DIAGNOSIS — E782 Mixed hyperlipidemia: Secondary | ICD-10-CM | POA: Diagnosis not present

## 2022-03-30 DIAGNOSIS — E038 Other specified hypothyroidism: Secondary | ICD-10-CM | POA: Diagnosis not present

## 2022-03-30 DIAGNOSIS — I219 Acute myocardial infarction, unspecified: Secondary | ICD-10-CM | POA: Diagnosis not present

## 2022-03-30 DIAGNOSIS — D518 Other vitamin B12 deficiency anemias: Secondary | ICD-10-CM | POA: Diagnosis not present

## 2022-03-30 DIAGNOSIS — E119 Type 2 diabetes mellitus without complications: Secondary | ICD-10-CM | POA: Diagnosis not present

## 2022-03-30 DIAGNOSIS — I639 Cerebral infarction, unspecified: Secondary | ICD-10-CM | POA: Diagnosis not present

## 2022-03-30 DIAGNOSIS — I10 Essential (primary) hypertension: Secondary | ICD-10-CM | POA: Diagnosis not present

## 2022-03-31 DIAGNOSIS — N1831 Chronic kidney disease, stage 3a: Secondary | ICD-10-CM | POA: Diagnosis not present

## 2022-03-31 DIAGNOSIS — I739 Peripheral vascular disease, unspecified: Secondary | ICD-10-CM | POA: Diagnosis not present

## 2022-03-31 DIAGNOSIS — I129 Hypertensive chronic kidney disease with stage 1 through stage 4 chronic kidney disease, or unspecified chronic kidney disease: Secondary | ICD-10-CM | POA: Diagnosis not present

## 2022-03-31 DIAGNOSIS — E785 Hyperlipidemia, unspecified: Secondary | ICD-10-CM | POA: Diagnosis not present

## 2022-03-31 DIAGNOSIS — I69952 Hemiplegia and hemiparesis following unspecified cerebrovascular disease affecting left dominant side: Secondary | ICD-10-CM | POA: Diagnosis not present

## 2022-03-31 DIAGNOSIS — Z86718 Personal history of other venous thrombosis and embolism: Secondary | ICD-10-CM | POA: Diagnosis not present

## 2022-03-31 DIAGNOSIS — Z7982 Long term (current) use of aspirin: Secondary | ICD-10-CM | POA: Diagnosis not present

## 2022-03-31 DIAGNOSIS — K219 Gastro-esophageal reflux disease without esophagitis: Secondary | ICD-10-CM | POA: Diagnosis not present

## 2022-03-31 DIAGNOSIS — Z7902 Long term (current) use of antithrombotics/antiplatelets: Secondary | ICD-10-CM | POA: Diagnosis not present

## 2022-04-05 DIAGNOSIS — E785 Hyperlipidemia, unspecified: Secondary | ICD-10-CM | POA: Diagnosis not present

## 2022-04-05 DIAGNOSIS — Z7982 Long term (current) use of aspirin: Secondary | ICD-10-CM | POA: Diagnosis not present

## 2022-04-05 DIAGNOSIS — N1831 Chronic kidney disease, stage 3a: Secondary | ICD-10-CM | POA: Diagnosis not present

## 2022-04-05 DIAGNOSIS — Z86718 Personal history of other venous thrombosis and embolism: Secondary | ICD-10-CM | POA: Diagnosis not present

## 2022-04-05 DIAGNOSIS — I69952 Hemiplegia and hemiparesis following unspecified cerebrovascular disease affecting left dominant side: Secondary | ICD-10-CM | POA: Diagnosis not present

## 2022-04-05 DIAGNOSIS — I739 Peripheral vascular disease, unspecified: Secondary | ICD-10-CM | POA: Diagnosis not present

## 2022-04-05 DIAGNOSIS — K219 Gastro-esophageal reflux disease without esophagitis: Secondary | ICD-10-CM | POA: Diagnosis not present

## 2022-04-05 DIAGNOSIS — Z7902 Long term (current) use of antithrombotics/antiplatelets: Secondary | ICD-10-CM | POA: Diagnosis not present

## 2022-04-05 DIAGNOSIS — I129 Hypertensive chronic kidney disease with stage 1 through stage 4 chronic kidney disease, or unspecified chronic kidney disease: Secondary | ICD-10-CM | POA: Diagnosis not present

## 2022-04-06 DIAGNOSIS — Z7902 Long term (current) use of antithrombotics/antiplatelets: Secondary | ICD-10-CM | POA: Diagnosis not present

## 2022-04-06 DIAGNOSIS — N1831 Chronic kidney disease, stage 3a: Secondary | ICD-10-CM | POA: Diagnosis not present

## 2022-04-06 DIAGNOSIS — I69952 Hemiplegia and hemiparesis following unspecified cerebrovascular disease affecting left dominant side: Secondary | ICD-10-CM | POA: Diagnosis not present

## 2022-04-06 DIAGNOSIS — I129 Hypertensive chronic kidney disease with stage 1 through stage 4 chronic kidney disease, or unspecified chronic kidney disease: Secondary | ICD-10-CM | POA: Diagnosis not present

## 2022-04-06 DIAGNOSIS — Z86718 Personal history of other venous thrombosis and embolism: Secondary | ICD-10-CM | POA: Diagnosis not present

## 2022-04-06 DIAGNOSIS — K219 Gastro-esophageal reflux disease without esophagitis: Secondary | ICD-10-CM | POA: Diagnosis not present

## 2022-04-06 DIAGNOSIS — I739 Peripheral vascular disease, unspecified: Secondary | ICD-10-CM | POA: Diagnosis not present

## 2022-04-06 DIAGNOSIS — E785 Hyperlipidemia, unspecified: Secondary | ICD-10-CM | POA: Diagnosis not present

## 2022-04-06 DIAGNOSIS — Z7982 Long term (current) use of aspirin: Secondary | ICD-10-CM | POA: Diagnosis not present

## 2022-04-07 DIAGNOSIS — E782 Mixed hyperlipidemia: Secondary | ICD-10-CM | POA: Diagnosis not present

## 2022-04-07 DIAGNOSIS — Z955 Presence of coronary angioplasty implant and graft: Secondary | ICD-10-CM | POA: Diagnosis not present

## 2022-04-07 DIAGNOSIS — I25118 Atherosclerotic heart disease of native coronary artery with other forms of angina pectoris: Secondary | ICD-10-CM | POA: Diagnosis not present

## 2022-04-07 DIAGNOSIS — I10 Essential (primary) hypertension: Secondary | ICD-10-CM | POA: Diagnosis not present

## 2022-04-07 DIAGNOSIS — I5043 Acute on chronic combined systolic (congestive) and diastolic (congestive) heart failure: Secondary | ICD-10-CM | POA: Diagnosis not present

## 2022-04-07 DIAGNOSIS — I6930 Unspecified sequelae of cerebral infarction: Secondary | ICD-10-CM | POA: Diagnosis not present

## 2022-04-07 DIAGNOSIS — I2102 ST elevation (STEMI) myocardial infarction involving left anterior descending coronary artery: Secondary | ICD-10-CM | POA: Diagnosis not present

## 2022-04-07 DIAGNOSIS — I739 Peripheral vascular disease, unspecified: Secondary | ICD-10-CM | POA: Diagnosis not present

## 2022-04-09 DIAGNOSIS — I10 Essential (primary) hypertension: Secondary | ICD-10-CM | POA: Diagnosis not present

## 2022-04-11 DIAGNOSIS — Z86718 Personal history of other venous thrombosis and embolism: Secondary | ICD-10-CM | POA: Diagnosis not present

## 2022-04-11 DIAGNOSIS — Z7902 Long term (current) use of antithrombotics/antiplatelets: Secondary | ICD-10-CM | POA: Diagnosis not present

## 2022-04-11 DIAGNOSIS — N1831 Chronic kidney disease, stage 3a: Secondary | ICD-10-CM | POA: Diagnosis not present

## 2022-04-11 DIAGNOSIS — K219 Gastro-esophageal reflux disease without esophagitis: Secondary | ICD-10-CM | POA: Diagnosis not present

## 2022-04-11 DIAGNOSIS — I129 Hypertensive chronic kidney disease with stage 1 through stage 4 chronic kidney disease, or unspecified chronic kidney disease: Secondary | ICD-10-CM | POA: Diagnosis not present

## 2022-04-11 DIAGNOSIS — I69952 Hemiplegia and hemiparesis following unspecified cerebrovascular disease affecting left dominant side: Secondary | ICD-10-CM | POA: Diagnosis not present

## 2022-04-11 DIAGNOSIS — E785 Hyperlipidemia, unspecified: Secondary | ICD-10-CM | POA: Diagnosis not present

## 2022-04-11 DIAGNOSIS — Z7982 Long term (current) use of aspirin: Secondary | ICD-10-CM | POA: Diagnosis not present

## 2022-04-11 DIAGNOSIS — I739 Peripheral vascular disease, unspecified: Secondary | ICD-10-CM | POA: Diagnosis not present

## 2022-04-12 DIAGNOSIS — G8194 Hemiplegia, unspecified affecting left nondominant side: Secondary | ICD-10-CM | POA: Diagnosis not present

## 2022-04-12 DIAGNOSIS — I639 Cerebral infarction, unspecified: Secondary | ICD-10-CM | POA: Diagnosis not present

## 2022-04-12 DIAGNOSIS — I82409 Acute embolism and thrombosis of unspecified deep veins of unspecified lower extremity: Secondary | ICD-10-CM | POA: Diagnosis not present

## 2022-04-12 DIAGNOSIS — K219 Gastro-esophageal reflux disease without esophagitis: Secondary | ICD-10-CM | POA: Diagnosis not present

## 2022-04-12 DIAGNOSIS — I739 Peripheral vascular disease, unspecified: Secondary | ICD-10-CM | POA: Diagnosis not present

## 2022-04-12 DIAGNOSIS — I10 Essential (primary) hypertension: Secondary | ICD-10-CM | POA: Diagnosis not present

## 2022-04-13 DIAGNOSIS — Z7982 Long term (current) use of aspirin: Secondary | ICD-10-CM | POA: Diagnosis not present

## 2022-04-13 DIAGNOSIS — L600 Ingrowing nail: Secondary | ICD-10-CM | POA: Diagnosis not present

## 2022-04-13 DIAGNOSIS — I69952 Hemiplegia and hemiparesis following unspecified cerebrovascular disease affecting left dominant side: Secondary | ICD-10-CM | POA: Diagnosis not present

## 2022-04-13 DIAGNOSIS — E785 Hyperlipidemia, unspecified: Secondary | ICD-10-CM | POA: Diagnosis not present

## 2022-04-13 DIAGNOSIS — I129 Hypertensive chronic kidney disease with stage 1 through stage 4 chronic kidney disease, or unspecified chronic kidney disease: Secondary | ICD-10-CM | POA: Diagnosis not present

## 2022-04-13 DIAGNOSIS — Z86718 Personal history of other venous thrombosis and embolism: Secondary | ICD-10-CM | POA: Diagnosis not present

## 2022-04-13 DIAGNOSIS — M79672 Pain in left foot: Secondary | ICD-10-CM | POA: Diagnosis not present

## 2022-04-13 DIAGNOSIS — B351 Tinea unguium: Secondary | ICD-10-CM | POA: Diagnosis not present

## 2022-04-13 DIAGNOSIS — M79671 Pain in right foot: Secondary | ICD-10-CM | POA: Diagnosis not present

## 2022-04-13 DIAGNOSIS — I739 Peripheral vascular disease, unspecified: Secondary | ICD-10-CM | POA: Diagnosis not present

## 2022-04-13 DIAGNOSIS — Z7902 Long term (current) use of antithrombotics/antiplatelets: Secondary | ICD-10-CM | POA: Diagnosis not present

## 2022-04-13 DIAGNOSIS — N1831 Chronic kidney disease, stage 3a: Secondary | ICD-10-CM | POA: Diagnosis not present

## 2022-04-13 DIAGNOSIS — K219 Gastro-esophageal reflux disease without esophagitis: Secondary | ICD-10-CM | POA: Diagnosis not present

## 2022-04-19 DIAGNOSIS — I739 Peripheral vascular disease, unspecified: Secondary | ICD-10-CM | POA: Diagnosis not present

## 2022-04-19 DIAGNOSIS — I69952 Hemiplegia and hemiparesis following unspecified cerebrovascular disease affecting left dominant side: Secondary | ICD-10-CM | POA: Diagnosis not present

## 2022-04-19 DIAGNOSIS — K219 Gastro-esophageal reflux disease without esophagitis: Secondary | ICD-10-CM | POA: Diagnosis not present

## 2022-04-19 DIAGNOSIS — Z7902 Long term (current) use of antithrombotics/antiplatelets: Secondary | ICD-10-CM | POA: Diagnosis not present

## 2022-04-19 DIAGNOSIS — I129 Hypertensive chronic kidney disease with stage 1 through stage 4 chronic kidney disease, or unspecified chronic kidney disease: Secondary | ICD-10-CM | POA: Diagnosis not present

## 2022-04-19 DIAGNOSIS — N1831 Chronic kidney disease, stage 3a: Secondary | ICD-10-CM | POA: Diagnosis not present

## 2022-04-19 DIAGNOSIS — Z7982 Long term (current) use of aspirin: Secondary | ICD-10-CM | POA: Diagnosis not present

## 2022-04-19 DIAGNOSIS — Z86718 Personal history of other venous thrombosis and embolism: Secondary | ICD-10-CM | POA: Diagnosis not present

## 2022-04-19 DIAGNOSIS — E785 Hyperlipidemia, unspecified: Secondary | ICD-10-CM | POA: Diagnosis not present

## 2022-04-21 DIAGNOSIS — Z7982 Long term (current) use of aspirin: Secondary | ICD-10-CM | POA: Diagnosis not present

## 2022-04-21 DIAGNOSIS — Z7902 Long term (current) use of antithrombotics/antiplatelets: Secondary | ICD-10-CM | POA: Diagnosis not present

## 2022-04-21 DIAGNOSIS — I129 Hypertensive chronic kidney disease with stage 1 through stage 4 chronic kidney disease, or unspecified chronic kidney disease: Secondary | ICD-10-CM | POA: Diagnosis not present

## 2022-04-21 DIAGNOSIS — E785 Hyperlipidemia, unspecified: Secondary | ICD-10-CM | POA: Diagnosis not present

## 2022-04-21 DIAGNOSIS — K219 Gastro-esophageal reflux disease without esophagitis: Secondary | ICD-10-CM | POA: Diagnosis not present

## 2022-04-21 DIAGNOSIS — Z86718 Personal history of other venous thrombosis and embolism: Secondary | ICD-10-CM | POA: Diagnosis not present

## 2022-04-21 DIAGNOSIS — I69952 Hemiplegia and hemiparesis following unspecified cerebrovascular disease affecting left dominant side: Secondary | ICD-10-CM | POA: Diagnosis not present

## 2022-04-21 DIAGNOSIS — N1831 Chronic kidney disease, stage 3a: Secondary | ICD-10-CM | POA: Diagnosis not present

## 2022-04-21 DIAGNOSIS — I739 Peripheral vascular disease, unspecified: Secondary | ICD-10-CM | POA: Diagnosis not present

## 2022-04-26 DIAGNOSIS — I69952 Hemiplegia and hemiparesis following unspecified cerebrovascular disease affecting left dominant side: Secondary | ICD-10-CM | POA: Diagnosis not present

## 2022-04-26 DIAGNOSIS — I739 Peripheral vascular disease, unspecified: Secondary | ICD-10-CM | POA: Diagnosis not present

## 2022-04-26 DIAGNOSIS — Z86718 Personal history of other venous thrombosis and embolism: Secondary | ICD-10-CM | POA: Diagnosis not present

## 2022-04-26 DIAGNOSIS — K219 Gastro-esophageal reflux disease without esophagitis: Secondary | ICD-10-CM | POA: Diagnosis not present

## 2022-04-26 DIAGNOSIS — I129 Hypertensive chronic kidney disease with stage 1 through stage 4 chronic kidney disease, or unspecified chronic kidney disease: Secondary | ICD-10-CM | POA: Diagnosis not present

## 2022-04-26 DIAGNOSIS — Z7902 Long term (current) use of antithrombotics/antiplatelets: Secondary | ICD-10-CM | POA: Diagnosis not present

## 2022-04-26 DIAGNOSIS — E785 Hyperlipidemia, unspecified: Secondary | ICD-10-CM | POA: Diagnosis not present

## 2022-04-26 DIAGNOSIS — N1831 Chronic kidney disease, stage 3a: Secondary | ICD-10-CM | POA: Diagnosis not present

## 2022-04-26 DIAGNOSIS — Z7982 Long term (current) use of aspirin: Secondary | ICD-10-CM | POA: Diagnosis not present

## 2022-04-28 DIAGNOSIS — I739 Peripheral vascular disease, unspecified: Secondary | ICD-10-CM | POA: Diagnosis not present

## 2022-04-28 DIAGNOSIS — N1831 Chronic kidney disease, stage 3a: Secondary | ICD-10-CM | POA: Diagnosis not present

## 2022-04-28 DIAGNOSIS — I129 Hypertensive chronic kidney disease with stage 1 through stage 4 chronic kidney disease, or unspecified chronic kidney disease: Secondary | ICD-10-CM | POA: Diagnosis not present

## 2022-04-28 DIAGNOSIS — K219 Gastro-esophageal reflux disease without esophagitis: Secondary | ICD-10-CM | POA: Diagnosis not present

## 2022-04-28 DIAGNOSIS — Z86718 Personal history of other venous thrombosis and embolism: Secondary | ICD-10-CM | POA: Diagnosis not present

## 2022-04-28 DIAGNOSIS — I69952 Hemiplegia and hemiparesis following unspecified cerebrovascular disease affecting left dominant side: Secondary | ICD-10-CM | POA: Diagnosis not present

## 2022-04-28 DIAGNOSIS — E785 Hyperlipidemia, unspecified: Secondary | ICD-10-CM | POA: Diagnosis not present

## 2022-04-28 DIAGNOSIS — Z7902 Long term (current) use of antithrombotics/antiplatelets: Secondary | ICD-10-CM | POA: Diagnosis not present

## 2022-04-28 DIAGNOSIS — Z7982 Long term (current) use of aspirin: Secondary | ICD-10-CM | POA: Diagnosis not present

## 2022-05-02 DIAGNOSIS — E782 Mixed hyperlipidemia: Secondary | ICD-10-CM | POA: Diagnosis not present

## 2022-05-02 DIAGNOSIS — E038 Other specified hypothyroidism: Secondary | ICD-10-CM | POA: Diagnosis not present

## 2022-05-02 DIAGNOSIS — D518 Other vitamin B12 deficiency anemias: Secondary | ICD-10-CM | POA: Diagnosis not present

## 2022-05-02 DIAGNOSIS — Z79899 Other long term (current) drug therapy: Secondary | ICD-10-CM | POA: Diagnosis not present

## 2022-05-02 DIAGNOSIS — E119 Type 2 diabetes mellitus without complications: Secondary | ICD-10-CM | POA: Diagnosis not present

## 2022-05-03 DIAGNOSIS — K219 Gastro-esophageal reflux disease without esophagitis: Secondary | ICD-10-CM | POA: Diagnosis not present

## 2022-05-03 DIAGNOSIS — N1831 Chronic kidney disease, stage 3a: Secondary | ICD-10-CM | POA: Diagnosis not present

## 2022-05-03 DIAGNOSIS — Z7902 Long term (current) use of antithrombotics/antiplatelets: Secondary | ICD-10-CM | POA: Diagnosis not present

## 2022-05-03 DIAGNOSIS — Z86718 Personal history of other venous thrombosis and embolism: Secondary | ICD-10-CM | POA: Diagnosis not present

## 2022-05-03 DIAGNOSIS — I129 Hypertensive chronic kidney disease with stage 1 through stage 4 chronic kidney disease, or unspecified chronic kidney disease: Secondary | ICD-10-CM | POA: Diagnosis not present

## 2022-05-03 DIAGNOSIS — I69952 Hemiplegia and hemiparesis following unspecified cerebrovascular disease affecting left dominant side: Secondary | ICD-10-CM | POA: Diagnosis not present

## 2022-05-03 DIAGNOSIS — Z7982 Long term (current) use of aspirin: Secondary | ICD-10-CM | POA: Diagnosis not present

## 2022-05-03 DIAGNOSIS — E785 Hyperlipidemia, unspecified: Secondary | ICD-10-CM | POA: Diagnosis not present

## 2022-05-03 DIAGNOSIS — I739 Peripheral vascular disease, unspecified: Secondary | ICD-10-CM | POA: Diagnosis not present

## 2022-05-05 DIAGNOSIS — I739 Peripheral vascular disease, unspecified: Secondary | ICD-10-CM | POA: Diagnosis not present

## 2022-05-05 DIAGNOSIS — Z7902 Long term (current) use of antithrombotics/antiplatelets: Secondary | ICD-10-CM | POA: Diagnosis not present

## 2022-05-05 DIAGNOSIS — D518 Other vitamin B12 deficiency anemias: Secondary | ICD-10-CM | POA: Diagnosis not present

## 2022-05-05 DIAGNOSIS — K219 Gastro-esophageal reflux disease without esophagitis: Secondary | ICD-10-CM | POA: Diagnosis not present

## 2022-05-05 DIAGNOSIS — I10 Essential (primary) hypertension: Secondary | ICD-10-CM | POA: Diagnosis not present

## 2022-05-05 DIAGNOSIS — E119 Type 2 diabetes mellitus without complications: Secondary | ICD-10-CM | POA: Diagnosis not present

## 2022-05-05 DIAGNOSIS — E782 Mixed hyperlipidemia: Secondary | ICD-10-CM | POA: Diagnosis not present

## 2022-05-05 DIAGNOSIS — I69952 Hemiplegia and hemiparesis following unspecified cerebrovascular disease affecting left dominant side: Secondary | ICD-10-CM | POA: Diagnosis not present

## 2022-05-05 DIAGNOSIS — E559 Vitamin D deficiency, unspecified: Secondary | ICD-10-CM | POA: Diagnosis not present

## 2022-05-05 DIAGNOSIS — E038 Other specified hypothyroidism: Secondary | ICD-10-CM | POA: Diagnosis not present

## 2022-05-05 DIAGNOSIS — Z7982 Long term (current) use of aspirin: Secondary | ICD-10-CM | POA: Diagnosis not present

## 2022-05-05 DIAGNOSIS — N1831 Chronic kidney disease, stage 3a: Secondary | ICD-10-CM | POA: Diagnosis not present

## 2022-05-05 DIAGNOSIS — I219 Acute myocardial infarction, unspecified: Secondary | ICD-10-CM | POA: Diagnosis not present

## 2022-05-05 DIAGNOSIS — I639 Cerebral infarction, unspecified: Secondary | ICD-10-CM | POA: Diagnosis not present

## 2022-05-05 DIAGNOSIS — Z86718 Personal history of other venous thrombosis and embolism: Secondary | ICD-10-CM | POA: Diagnosis not present

## 2022-05-05 DIAGNOSIS — I129 Hypertensive chronic kidney disease with stage 1 through stage 4 chronic kidney disease, or unspecified chronic kidney disease: Secondary | ICD-10-CM | POA: Diagnosis not present

## 2022-05-05 DIAGNOSIS — E785 Hyperlipidemia, unspecified: Secondary | ICD-10-CM | POA: Diagnosis not present

## 2022-05-09 DIAGNOSIS — Z7902 Long term (current) use of antithrombotics/antiplatelets: Secondary | ICD-10-CM | POA: Diagnosis not present

## 2022-05-09 DIAGNOSIS — N1831 Chronic kidney disease, stage 3a: Secondary | ICD-10-CM | POA: Diagnosis not present

## 2022-05-09 DIAGNOSIS — I129 Hypertensive chronic kidney disease with stage 1 through stage 4 chronic kidney disease, or unspecified chronic kidney disease: Secondary | ICD-10-CM | POA: Diagnosis not present

## 2022-05-09 DIAGNOSIS — Z7982 Long term (current) use of aspirin: Secondary | ICD-10-CM | POA: Diagnosis not present

## 2022-05-09 DIAGNOSIS — Z86718 Personal history of other venous thrombosis and embolism: Secondary | ICD-10-CM | POA: Diagnosis not present

## 2022-05-09 DIAGNOSIS — E785 Hyperlipidemia, unspecified: Secondary | ICD-10-CM | POA: Diagnosis not present

## 2022-05-09 DIAGNOSIS — I69952 Hemiplegia and hemiparesis following unspecified cerebrovascular disease affecting left dominant side: Secondary | ICD-10-CM | POA: Diagnosis not present

## 2022-05-09 DIAGNOSIS — K219 Gastro-esophageal reflux disease without esophagitis: Secondary | ICD-10-CM | POA: Diagnosis not present

## 2022-05-09 DIAGNOSIS — I739 Peripheral vascular disease, unspecified: Secondary | ICD-10-CM | POA: Diagnosis not present

## 2022-05-10 DIAGNOSIS — N1831 Chronic kidney disease, stage 3a: Secondary | ICD-10-CM | POA: Diagnosis not present

## 2022-05-10 DIAGNOSIS — I739 Peripheral vascular disease, unspecified: Secondary | ICD-10-CM | POA: Diagnosis not present

## 2022-05-10 DIAGNOSIS — Z86718 Personal history of other venous thrombosis and embolism: Secondary | ICD-10-CM | POA: Diagnosis not present

## 2022-05-10 DIAGNOSIS — I129 Hypertensive chronic kidney disease with stage 1 through stage 4 chronic kidney disease, or unspecified chronic kidney disease: Secondary | ICD-10-CM | POA: Diagnosis not present

## 2022-05-10 DIAGNOSIS — K219 Gastro-esophageal reflux disease without esophagitis: Secondary | ICD-10-CM | POA: Diagnosis not present

## 2022-05-10 DIAGNOSIS — E785 Hyperlipidemia, unspecified: Secondary | ICD-10-CM | POA: Diagnosis not present

## 2022-05-10 DIAGNOSIS — Z7902 Long term (current) use of antithrombotics/antiplatelets: Secondary | ICD-10-CM | POA: Diagnosis not present

## 2022-05-10 DIAGNOSIS — I69952 Hemiplegia and hemiparesis following unspecified cerebrovascular disease affecting left dominant side: Secondary | ICD-10-CM | POA: Diagnosis not present

## 2022-05-10 DIAGNOSIS — I10 Essential (primary) hypertension: Secondary | ICD-10-CM | POA: Diagnosis not present

## 2022-05-10 DIAGNOSIS — Z7982 Long term (current) use of aspirin: Secondary | ICD-10-CM | POA: Diagnosis not present

## 2022-05-11 DIAGNOSIS — N1831 Chronic kidney disease, stage 3a: Secondary | ICD-10-CM | POA: Diagnosis not present

## 2022-05-11 DIAGNOSIS — I129 Hypertensive chronic kidney disease with stage 1 through stage 4 chronic kidney disease, or unspecified chronic kidney disease: Secondary | ICD-10-CM | POA: Diagnosis not present

## 2022-05-11 DIAGNOSIS — Z7982 Long term (current) use of aspirin: Secondary | ICD-10-CM | POA: Diagnosis not present

## 2022-05-11 DIAGNOSIS — Z86718 Personal history of other venous thrombosis and embolism: Secondary | ICD-10-CM | POA: Diagnosis not present

## 2022-05-11 DIAGNOSIS — K219 Gastro-esophageal reflux disease without esophagitis: Secondary | ICD-10-CM | POA: Diagnosis not present

## 2022-05-11 DIAGNOSIS — I69952 Hemiplegia and hemiparesis following unspecified cerebrovascular disease affecting left dominant side: Secondary | ICD-10-CM | POA: Diagnosis not present

## 2022-05-11 DIAGNOSIS — E785 Hyperlipidemia, unspecified: Secondary | ICD-10-CM | POA: Diagnosis not present

## 2022-05-11 DIAGNOSIS — Z7902 Long term (current) use of antithrombotics/antiplatelets: Secondary | ICD-10-CM | POA: Diagnosis not present

## 2022-05-11 DIAGNOSIS — I739 Peripheral vascular disease, unspecified: Secondary | ICD-10-CM | POA: Diagnosis not present

## 2022-05-12 DIAGNOSIS — Z7982 Long term (current) use of aspirin: Secondary | ICD-10-CM | POA: Diagnosis not present

## 2022-05-12 DIAGNOSIS — I739 Peripheral vascular disease, unspecified: Secondary | ICD-10-CM | POA: Diagnosis not present

## 2022-05-12 DIAGNOSIS — K219 Gastro-esophageal reflux disease without esophagitis: Secondary | ICD-10-CM | POA: Diagnosis not present

## 2022-05-12 DIAGNOSIS — E785 Hyperlipidemia, unspecified: Secondary | ICD-10-CM | POA: Diagnosis not present

## 2022-05-12 DIAGNOSIS — I69952 Hemiplegia and hemiparesis following unspecified cerebrovascular disease affecting left dominant side: Secondary | ICD-10-CM | POA: Diagnosis not present

## 2022-05-12 DIAGNOSIS — N1831 Chronic kidney disease, stage 3a: Secondary | ICD-10-CM | POA: Diagnosis not present

## 2022-05-12 DIAGNOSIS — Z86718 Personal history of other venous thrombosis and embolism: Secondary | ICD-10-CM | POA: Diagnosis not present

## 2022-05-12 DIAGNOSIS — Z7902 Long term (current) use of antithrombotics/antiplatelets: Secondary | ICD-10-CM | POA: Diagnosis not present

## 2022-05-12 DIAGNOSIS — I129 Hypertensive chronic kidney disease with stage 1 through stage 4 chronic kidney disease, or unspecified chronic kidney disease: Secondary | ICD-10-CM | POA: Diagnosis not present

## 2022-05-16 DIAGNOSIS — I69952 Hemiplegia and hemiparesis following unspecified cerebrovascular disease affecting left dominant side: Secondary | ICD-10-CM | POA: Diagnosis not present

## 2022-05-16 DIAGNOSIS — I129 Hypertensive chronic kidney disease with stage 1 through stage 4 chronic kidney disease, or unspecified chronic kidney disease: Secondary | ICD-10-CM | POA: Diagnosis not present

## 2022-05-16 DIAGNOSIS — N1831 Chronic kidney disease, stage 3a: Secondary | ICD-10-CM | POA: Diagnosis not present

## 2022-05-16 DIAGNOSIS — E785 Hyperlipidemia, unspecified: Secondary | ICD-10-CM | POA: Diagnosis not present

## 2022-05-16 DIAGNOSIS — Z86718 Personal history of other venous thrombosis and embolism: Secondary | ICD-10-CM | POA: Diagnosis not present

## 2022-05-16 DIAGNOSIS — K219 Gastro-esophageal reflux disease without esophagitis: Secondary | ICD-10-CM | POA: Diagnosis not present

## 2022-05-16 DIAGNOSIS — I739 Peripheral vascular disease, unspecified: Secondary | ICD-10-CM | POA: Diagnosis not present

## 2022-05-16 DIAGNOSIS — Z7902 Long term (current) use of antithrombotics/antiplatelets: Secondary | ICD-10-CM | POA: Diagnosis not present

## 2022-05-16 DIAGNOSIS — Z7982 Long term (current) use of aspirin: Secondary | ICD-10-CM | POA: Diagnosis not present

## 2022-05-18 DIAGNOSIS — I739 Peripheral vascular disease, unspecified: Secondary | ICD-10-CM | POA: Diagnosis not present

## 2022-05-18 DIAGNOSIS — Z7902 Long term (current) use of antithrombotics/antiplatelets: Secondary | ICD-10-CM | POA: Diagnosis not present

## 2022-05-18 DIAGNOSIS — Z7982 Long term (current) use of aspirin: Secondary | ICD-10-CM | POA: Diagnosis not present

## 2022-05-18 DIAGNOSIS — I69952 Hemiplegia and hemiparesis following unspecified cerebrovascular disease affecting left dominant side: Secondary | ICD-10-CM | POA: Diagnosis not present

## 2022-05-18 DIAGNOSIS — N1831 Chronic kidney disease, stage 3a: Secondary | ICD-10-CM | POA: Diagnosis not present

## 2022-05-18 DIAGNOSIS — K219 Gastro-esophageal reflux disease without esophagitis: Secondary | ICD-10-CM | POA: Diagnosis not present

## 2022-05-18 DIAGNOSIS — I129 Hypertensive chronic kidney disease with stage 1 through stage 4 chronic kidney disease, or unspecified chronic kidney disease: Secondary | ICD-10-CM | POA: Diagnosis not present

## 2022-05-18 DIAGNOSIS — E785 Hyperlipidemia, unspecified: Secondary | ICD-10-CM | POA: Diagnosis not present

## 2022-05-18 DIAGNOSIS — Z86718 Personal history of other venous thrombosis and embolism: Secondary | ICD-10-CM | POA: Diagnosis not present

## 2022-05-19 DIAGNOSIS — K219 Gastro-esophageal reflux disease without esophagitis: Secondary | ICD-10-CM | POA: Diagnosis not present

## 2022-05-19 DIAGNOSIS — G8194 Hemiplegia, unspecified affecting left nondominant side: Secondary | ICD-10-CM | POA: Diagnosis not present

## 2022-05-19 DIAGNOSIS — I639 Cerebral infarction, unspecified: Secondary | ICD-10-CM | POA: Diagnosis not present

## 2022-05-19 DIAGNOSIS — I10 Essential (primary) hypertension: Secondary | ICD-10-CM | POA: Diagnosis not present

## 2022-05-24 DIAGNOSIS — I69952 Hemiplegia and hemiparesis following unspecified cerebrovascular disease affecting left dominant side: Secondary | ICD-10-CM | POA: Diagnosis not present

## 2022-05-24 DIAGNOSIS — Z86718 Personal history of other venous thrombosis and embolism: Secondary | ICD-10-CM | POA: Diagnosis not present

## 2022-05-24 DIAGNOSIS — I739 Peripheral vascular disease, unspecified: Secondary | ICD-10-CM | POA: Diagnosis not present

## 2022-05-24 DIAGNOSIS — Z7902 Long term (current) use of antithrombotics/antiplatelets: Secondary | ICD-10-CM | POA: Diagnosis not present

## 2022-05-24 DIAGNOSIS — N1831 Chronic kidney disease, stage 3a: Secondary | ICD-10-CM | POA: Diagnosis not present

## 2022-05-24 DIAGNOSIS — I129 Hypertensive chronic kidney disease with stage 1 through stage 4 chronic kidney disease, or unspecified chronic kidney disease: Secondary | ICD-10-CM | POA: Diagnosis not present

## 2022-05-24 DIAGNOSIS — Z7982 Long term (current) use of aspirin: Secondary | ICD-10-CM | POA: Diagnosis not present

## 2022-05-24 DIAGNOSIS — K219 Gastro-esophageal reflux disease without esophagitis: Secondary | ICD-10-CM | POA: Diagnosis not present

## 2022-05-24 DIAGNOSIS — E785 Hyperlipidemia, unspecified: Secondary | ICD-10-CM | POA: Diagnosis not present

## 2022-05-25 DIAGNOSIS — Z86718 Personal history of other venous thrombosis and embolism: Secondary | ICD-10-CM | POA: Diagnosis not present

## 2022-05-25 DIAGNOSIS — I739 Peripheral vascular disease, unspecified: Secondary | ICD-10-CM | POA: Diagnosis not present

## 2022-05-25 DIAGNOSIS — Z7902 Long term (current) use of antithrombotics/antiplatelets: Secondary | ICD-10-CM | POA: Diagnosis not present

## 2022-05-25 DIAGNOSIS — I69952 Hemiplegia and hemiparesis following unspecified cerebrovascular disease affecting left dominant side: Secondary | ICD-10-CM | POA: Diagnosis not present

## 2022-05-25 DIAGNOSIS — I129 Hypertensive chronic kidney disease with stage 1 through stage 4 chronic kidney disease, or unspecified chronic kidney disease: Secondary | ICD-10-CM | POA: Diagnosis not present

## 2022-05-25 DIAGNOSIS — N1831 Chronic kidney disease, stage 3a: Secondary | ICD-10-CM | POA: Diagnosis not present

## 2022-05-25 DIAGNOSIS — Z7982 Long term (current) use of aspirin: Secondary | ICD-10-CM | POA: Diagnosis not present

## 2022-05-25 DIAGNOSIS — K219 Gastro-esophageal reflux disease without esophagitis: Secondary | ICD-10-CM | POA: Diagnosis not present

## 2022-05-25 DIAGNOSIS — E785 Hyperlipidemia, unspecified: Secondary | ICD-10-CM | POA: Diagnosis not present

## 2022-05-26 DIAGNOSIS — I129 Hypertensive chronic kidney disease with stage 1 through stage 4 chronic kidney disease, or unspecified chronic kidney disease: Secondary | ICD-10-CM | POA: Diagnosis not present

## 2022-05-26 DIAGNOSIS — N1831 Chronic kidney disease, stage 3a: Secondary | ICD-10-CM | POA: Diagnosis not present

## 2022-05-26 DIAGNOSIS — E785 Hyperlipidemia, unspecified: Secondary | ICD-10-CM | POA: Diagnosis not present

## 2022-05-26 DIAGNOSIS — Z7982 Long term (current) use of aspirin: Secondary | ICD-10-CM | POA: Diagnosis not present

## 2022-05-26 DIAGNOSIS — Z7902 Long term (current) use of antithrombotics/antiplatelets: Secondary | ICD-10-CM | POA: Diagnosis not present

## 2022-05-26 DIAGNOSIS — I739 Peripheral vascular disease, unspecified: Secondary | ICD-10-CM | POA: Diagnosis not present

## 2022-05-26 DIAGNOSIS — K219 Gastro-esophageal reflux disease without esophagitis: Secondary | ICD-10-CM | POA: Diagnosis not present

## 2022-05-26 DIAGNOSIS — I69952 Hemiplegia and hemiparesis following unspecified cerebrovascular disease affecting left dominant side: Secondary | ICD-10-CM | POA: Diagnosis not present

## 2022-05-26 DIAGNOSIS — Z86718 Personal history of other venous thrombosis and embolism: Secondary | ICD-10-CM | POA: Diagnosis not present

## 2022-05-27 DIAGNOSIS — I639 Cerebral infarction, unspecified: Secondary | ICD-10-CM | POA: Diagnosis not present

## 2022-05-27 DIAGNOSIS — E559 Vitamin D deficiency, unspecified: Secondary | ICD-10-CM | POA: Diagnosis not present

## 2022-05-27 DIAGNOSIS — I219 Acute myocardial infarction, unspecified: Secondary | ICD-10-CM | POA: Diagnosis not present

## 2022-05-27 DIAGNOSIS — I10 Essential (primary) hypertension: Secondary | ICD-10-CM | POA: Diagnosis not present

## 2022-05-27 DIAGNOSIS — E038 Other specified hypothyroidism: Secondary | ICD-10-CM | POA: Diagnosis not present

## 2022-05-27 DIAGNOSIS — E7849 Other hyperlipidemia: Secondary | ICD-10-CM | POA: Diagnosis not present

## 2022-05-27 DIAGNOSIS — D518 Other vitamin B12 deficiency anemias: Secondary | ICD-10-CM | POA: Diagnosis not present

## 2022-05-27 DIAGNOSIS — E782 Mixed hyperlipidemia: Secondary | ICD-10-CM | POA: Diagnosis not present

## 2022-05-27 DIAGNOSIS — G8194 Hemiplegia, unspecified affecting left nondominant side: Secondary | ICD-10-CM | POA: Diagnosis not present

## 2022-05-27 DIAGNOSIS — I70223 Atherosclerosis of native arteries of extremities with rest pain, bilateral legs: Secondary | ICD-10-CM | POA: Diagnosis not present

## 2022-05-27 DIAGNOSIS — E119 Type 2 diabetes mellitus without complications: Secondary | ICD-10-CM | POA: Diagnosis not present

## 2022-05-30 DIAGNOSIS — Z7982 Long term (current) use of aspirin: Secondary | ICD-10-CM | POA: Diagnosis not present

## 2022-05-30 DIAGNOSIS — E785 Hyperlipidemia, unspecified: Secondary | ICD-10-CM | POA: Diagnosis not present

## 2022-05-30 DIAGNOSIS — K219 Gastro-esophageal reflux disease without esophagitis: Secondary | ICD-10-CM | POA: Diagnosis not present

## 2022-05-30 DIAGNOSIS — N1831 Chronic kidney disease, stage 3a: Secondary | ICD-10-CM | POA: Diagnosis not present

## 2022-05-30 DIAGNOSIS — I69952 Hemiplegia and hemiparesis following unspecified cerebrovascular disease affecting left dominant side: Secondary | ICD-10-CM | POA: Diagnosis not present

## 2022-05-30 DIAGNOSIS — Z7902 Long term (current) use of antithrombotics/antiplatelets: Secondary | ICD-10-CM | POA: Diagnosis not present

## 2022-05-30 DIAGNOSIS — Z86718 Personal history of other venous thrombosis and embolism: Secondary | ICD-10-CM | POA: Diagnosis not present

## 2022-05-30 DIAGNOSIS — I739 Peripheral vascular disease, unspecified: Secondary | ICD-10-CM | POA: Diagnosis not present

## 2022-05-30 DIAGNOSIS — I129 Hypertensive chronic kidney disease with stage 1 through stage 4 chronic kidney disease, or unspecified chronic kidney disease: Secondary | ICD-10-CM | POA: Diagnosis not present

## 2022-06-02 DIAGNOSIS — I69952 Hemiplegia and hemiparesis following unspecified cerebrovascular disease affecting left dominant side: Secondary | ICD-10-CM | POA: Diagnosis not present

## 2022-06-02 DIAGNOSIS — K219 Gastro-esophageal reflux disease without esophagitis: Secondary | ICD-10-CM | POA: Diagnosis not present

## 2022-06-02 DIAGNOSIS — I129 Hypertensive chronic kidney disease with stage 1 through stage 4 chronic kidney disease, or unspecified chronic kidney disease: Secondary | ICD-10-CM | POA: Diagnosis not present

## 2022-06-02 DIAGNOSIS — Z7902 Long term (current) use of antithrombotics/antiplatelets: Secondary | ICD-10-CM | POA: Diagnosis not present

## 2022-06-02 DIAGNOSIS — N1831 Chronic kidney disease, stage 3a: Secondary | ICD-10-CM | POA: Diagnosis not present

## 2022-06-02 DIAGNOSIS — Z7982 Long term (current) use of aspirin: Secondary | ICD-10-CM | POA: Diagnosis not present

## 2022-06-02 DIAGNOSIS — I739 Peripheral vascular disease, unspecified: Secondary | ICD-10-CM | POA: Diagnosis not present

## 2022-06-02 DIAGNOSIS — E785 Hyperlipidemia, unspecified: Secondary | ICD-10-CM | POA: Diagnosis not present

## 2022-06-02 DIAGNOSIS — Z86718 Personal history of other venous thrombosis and embolism: Secondary | ICD-10-CM | POA: Diagnosis not present

## 2022-06-06 DIAGNOSIS — Z86718 Personal history of other venous thrombosis and embolism: Secondary | ICD-10-CM | POA: Diagnosis not present

## 2022-06-06 DIAGNOSIS — Z7902 Long term (current) use of antithrombotics/antiplatelets: Secondary | ICD-10-CM | POA: Diagnosis not present

## 2022-06-06 DIAGNOSIS — I739 Peripheral vascular disease, unspecified: Secondary | ICD-10-CM | POA: Diagnosis not present

## 2022-06-06 DIAGNOSIS — I129 Hypertensive chronic kidney disease with stage 1 through stage 4 chronic kidney disease, or unspecified chronic kidney disease: Secondary | ICD-10-CM | POA: Diagnosis not present

## 2022-06-06 DIAGNOSIS — E785 Hyperlipidemia, unspecified: Secondary | ICD-10-CM | POA: Diagnosis not present

## 2022-06-06 DIAGNOSIS — K219 Gastro-esophageal reflux disease without esophagitis: Secondary | ICD-10-CM | POA: Diagnosis not present

## 2022-06-06 DIAGNOSIS — Z7982 Long term (current) use of aspirin: Secondary | ICD-10-CM | POA: Diagnosis not present

## 2022-06-06 DIAGNOSIS — I69952 Hemiplegia and hemiparesis following unspecified cerebrovascular disease affecting left dominant side: Secondary | ICD-10-CM | POA: Diagnosis not present

## 2022-06-06 DIAGNOSIS — N1831 Chronic kidney disease, stage 3a: Secondary | ICD-10-CM | POA: Diagnosis not present

## 2022-06-08 DIAGNOSIS — I69952 Hemiplegia and hemiparesis following unspecified cerebrovascular disease affecting left dominant side: Secondary | ICD-10-CM | POA: Diagnosis not present

## 2022-06-08 DIAGNOSIS — K219 Gastro-esophageal reflux disease without esophagitis: Secondary | ICD-10-CM | POA: Diagnosis not present

## 2022-06-08 DIAGNOSIS — I739 Peripheral vascular disease, unspecified: Secondary | ICD-10-CM | POA: Diagnosis not present

## 2022-06-08 DIAGNOSIS — I129 Hypertensive chronic kidney disease with stage 1 through stage 4 chronic kidney disease, or unspecified chronic kidney disease: Secondary | ICD-10-CM | POA: Diagnosis not present

## 2022-06-08 DIAGNOSIS — E785 Hyperlipidemia, unspecified: Secondary | ICD-10-CM | POA: Diagnosis not present

## 2022-06-08 DIAGNOSIS — Z7982 Long term (current) use of aspirin: Secondary | ICD-10-CM | POA: Diagnosis not present

## 2022-06-08 DIAGNOSIS — Z86718 Personal history of other venous thrombosis and embolism: Secondary | ICD-10-CM | POA: Diagnosis not present

## 2022-06-08 DIAGNOSIS — N1831 Chronic kidney disease, stage 3a: Secondary | ICD-10-CM | POA: Diagnosis not present

## 2022-06-08 DIAGNOSIS — Z7902 Long term (current) use of antithrombotics/antiplatelets: Secondary | ICD-10-CM | POA: Diagnosis not present

## 2022-06-14 DIAGNOSIS — I739 Peripheral vascular disease, unspecified: Secondary | ICD-10-CM | POA: Diagnosis not present

## 2022-06-14 DIAGNOSIS — Z7982 Long term (current) use of aspirin: Secondary | ICD-10-CM | POA: Diagnosis not present

## 2022-06-14 DIAGNOSIS — N1831 Chronic kidney disease, stage 3a: Secondary | ICD-10-CM | POA: Diagnosis not present

## 2022-06-14 DIAGNOSIS — I129 Hypertensive chronic kidney disease with stage 1 through stage 4 chronic kidney disease, or unspecified chronic kidney disease: Secondary | ICD-10-CM | POA: Diagnosis not present

## 2022-06-14 DIAGNOSIS — E785 Hyperlipidemia, unspecified: Secondary | ICD-10-CM | POA: Diagnosis not present

## 2022-06-14 DIAGNOSIS — I69952 Hemiplegia and hemiparesis following unspecified cerebrovascular disease affecting left dominant side: Secondary | ICD-10-CM | POA: Diagnosis not present

## 2022-06-14 DIAGNOSIS — Z7902 Long term (current) use of antithrombotics/antiplatelets: Secondary | ICD-10-CM | POA: Diagnosis not present

## 2022-06-14 DIAGNOSIS — Z86718 Personal history of other venous thrombosis and embolism: Secondary | ICD-10-CM | POA: Diagnosis not present

## 2022-06-14 DIAGNOSIS — K219 Gastro-esophageal reflux disease without esophagitis: Secondary | ICD-10-CM | POA: Diagnosis not present

## 2022-06-16 DIAGNOSIS — E038 Other specified hypothyroidism: Secondary | ICD-10-CM | POA: Diagnosis not present

## 2022-06-16 DIAGNOSIS — I10 Essential (primary) hypertension: Secondary | ICD-10-CM | POA: Diagnosis not present

## 2022-06-16 DIAGNOSIS — E7849 Other hyperlipidemia: Secondary | ICD-10-CM | POA: Diagnosis not present

## 2022-06-16 DIAGNOSIS — E782 Mixed hyperlipidemia: Secondary | ICD-10-CM | POA: Diagnosis not present

## 2022-06-16 DIAGNOSIS — I70223 Atherosclerosis of native arteries of extremities with rest pain, bilateral legs: Secondary | ICD-10-CM | POA: Diagnosis not present

## 2022-06-16 DIAGNOSIS — E559 Vitamin D deficiency, unspecified: Secondary | ICD-10-CM | POA: Diagnosis not present

## 2022-06-16 DIAGNOSIS — D518 Other vitamin B12 deficiency anemias: Secondary | ICD-10-CM | POA: Diagnosis not present

## 2022-06-16 DIAGNOSIS — I219 Acute myocardial infarction, unspecified: Secondary | ICD-10-CM | POA: Diagnosis not present

## 2022-06-16 DIAGNOSIS — G8194 Hemiplegia, unspecified affecting left nondominant side: Secondary | ICD-10-CM | POA: Diagnosis not present

## 2022-06-16 DIAGNOSIS — E119 Type 2 diabetes mellitus without complications: Secondary | ICD-10-CM | POA: Diagnosis not present

## 2022-06-16 DIAGNOSIS — I639 Cerebral infarction, unspecified: Secondary | ICD-10-CM | POA: Diagnosis not present

## 2022-06-20 DIAGNOSIS — K219 Gastro-esophageal reflux disease without esophagitis: Secondary | ICD-10-CM | POA: Diagnosis not present

## 2022-06-20 DIAGNOSIS — I129 Hypertensive chronic kidney disease with stage 1 through stage 4 chronic kidney disease, or unspecified chronic kidney disease: Secondary | ICD-10-CM | POA: Diagnosis not present

## 2022-06-20 DIAGNOSIS — N1831 Chronic kidney disease, stage 3a: Secondary | ICD-10-CM | POA: Diagnosis not present

## 2022-06-20 DIAGNOSIS — Z7902 Long term (current) use of antithrombotics/antiplatelets: Secondary | ICD-10-CM | POA: Diagnosis not present

## 2022-06-20 DIAGNOSIS — E785 Hyperlipidemia, unspecified: Secondary | ICD-10-CM | POA: Diagnosis not present

## 2022-06-20 DIAGNOSIS — I69952 Hemiplegia and hemiparesis following unspecified cerebrovascular disease affecting left dominant side: Secondary | ICD-10-CM | POA: Diagnosis not present

## 2022-06-20 DIAGNOSIS — Z7982 Long term (current) use of aspirin: Secondary | ICD-10-CM | POA: Diagnosis not present

## 2022-06-20 DIAGNOSIS — I739 Peripheral vascular disease, unspecified: Secondary | ICD-10-CM | POA: Diagnosis not present

## 2022-06-20 DIAGNOSIS — Z86718 Personal history of other venous thrombosis and embolism: Secondary | ICD-10-CM | POA: Diagnosis not present

## 2022-06-21 DIAGNOSIS — I82409 Acute embolism and thrombosis of unspecified deep veins of unspecified lower extremity: Secondary | ICD-10-CM | POA: Diagnosis not present

## 2022-06-21 DIAGNOSIS — N1831 Chronic kidney disease, stage 3a: Secondary | ICD-10-CM | POA: Diagnosis not present

## 2022-06-21 DIAGNOSIS — I10 Essential (primary) hypertension: Secondary | ICD-10-CM | POA: Diagnosis not present

## 2022-06-21 DIAGNOSIS — G8194 Hemiplegia, unspecified affecting left nondominant side: Secondary | ICD-10-CM | POA: Diagnosis not present

## 2022-06-21 DIAGNOSIS — K219 Gastro-esophageal reflux disease without esophagitis: Secondary | ICD-10-CM | POA: Diagnosis not present

## 2022-06-22 DIAGNOSIS — K219 Gastro-esophageal reflux disease without esophagitis: Secondary | ICD-10-CM | POA: Diagnosis not present

## 2022-06-22 DIAGNOSIS — I69952 Hemiplegia and hemiparesis following unspecified cerebrovascular disease affecting left dominant side: Secondary | ICD-10-CM | POA: Diagnosis not present

## 2022-06-22 DIAGNOSIS — E785 Hyperlipidemia, unspecified: Secondary | ICD-10-CM | POA: Diagnosis not present

## 2022-06-22 DIAGNOSIS — I739 Peripheral vascular disease, unspecified: Secondary | ICD-10-CM | POA: Diagnosis not present

## 2022-06-22 DIAGNOSIS — I129 Hypertensive chronic kidney disease with stage 1 through stage 4 chronic kidney disease, or unspecified chronic kidney disease: Secondary | ICD-10-CM | POA: Diagnosis not present

## 2022-06-22 DIAGNOSIS — Z7982 Long term (current) use of aspirin: Secondary | ICD-10-CM | POA: Diagnosis not present

## 2022-06-22 DIAGNOSIS — N1831 Chronic kidney disease, stage 3a: Secondary | ICD-10-CM | POA: Diagnosis not present

## 2022-06-22 DIAGNOSIS — Z7902 Long term (current) use of antithrombotics/antiplatelets: Secondary | ICD-10-CM | POA: Diagnosis not present

## 2022-06-22 DIAGNOSIS — Z86718 Personal history of other venous thrombosis and embolism: Secondary | ICD-10-CM | POA: Diagnosis not present

## 2022-06-27 DIAGNOSIS — I739 Peripheral vascular disease, unspecified: Secondary | ICD-10-CM | POA: Diagnosis not present

## 2022-06-27 DIAGNOSIS — N1831 Chronic kidney disease, stage 3a: Secondary | ICD-10-CM | POA: Diagnosis not present

## 2022-06-27 DIAGNOSIS — K219 Gastro-esophageal reflux disease without esophagitis: Secondary | ICD-10-CM | POA: Diagnosis not present

## 2022-06-27 DIAGNOSIS — E785 Hyperlipidemia, unspecified: Secondary | ICD-10-CM | POA: Diagnosis not present

## 2022-06-27 DIAGNOSIS — Z7982 Long term (current) use of aspirin: Secondary | ICD-10-CM | POA: Diagnosis not present

## 2022-06-27 DIAGNOSIS — I69952 Hemiplegia and hemiparesis following unspecified cerebrovascular disease affecting left dominant side: Secondary | ICD-10-CM | POA: Diagnosis not present

## 2022-06-27 DIAGNOSIS — Z86718 Personal history of other venous thrombosis and embolism: Secondary | ICD-10-CM | POA: Diagnosis not present

## 2022-06-27 DIAGNOSIS — Z7902 Long term (current) use of antithrombotics/antiplatelets: Secondary | ICD-10-CM | POA: Diagnosis not present

## 2022-06-27 DIAGNOSIS — I129 Hypertensive chronic kidney disease with stage 1 through stage 4 chronic kidney disease, or unspecified chronic kidney disease: Secondary | ICD-10-CM | POA: Diagnosis not present

## 2022-06-30 DIAGNOSIS — Z86718 Personal history of other venous thrombosis and embolism: Secondary | ICD-10-CM | POA: Diagnosis not present

## 2022-06-30 DIAGNOSIS — Z7982 Long term (current) use of aspirin: Secondary | ICD-10-CM | POA: Diagnosis not present

## 2022-06-30 DIAGNOSIS — N1831 Chronic kidney disease, stage 3a: Secondary | ICD-10-CM | POA: Diagnosis not present

## 2022-06-30 DIAGNOSIS — I739 Peripheral vascular disease, unspecified: Secondary | ICD-10-CM | POA: Diagnosis not present

## 2022-06-30 DIAGNOSIS — I69952 Hemiplegia and hemiparesis following unspecified cerebrovascular disease affecting left dominant side: Secondary | ICD-10-CM | POA: Diagnosis not present

## 2022-06-30 DIAGNOSIS — E785 Hyperlipidemia, unspecified: Secondary | ICD-10-CM | POA: Diagnosis not present

## 2022-06-30 DIAGNOSIS — K219 Gastro-esophageal reflux disease without esophagitis: Secondary | ICD-10-CM | POA: Diagnosis not present

## 2022-06-30 DIAGNOSIS — Z7902 Long term (current) use of antithrombotics/antiplatelets: Secondary | ICD-10-CM | POA: Diagnosis not present

## 2022-06-30 DIAGNOSIS — I129 Hypertensive chronic kidney disease with stage 1 through stage 4 chronic kidney disease, or unspecified chronic kidney disease: Secondary | ICD-10-CM | POA: Diagnosis not present

## 2022-07-04 DIAGNOSIS — K219 Gastro-esophageal reflux disease without esophagitis: Secondary | ICD-10-CM | POA: Diagnosis not present

## 2022-07-04 DIAGNOSIS — Z7902 Long term (current) use of antithrombotics/antiplatelets: Secondary | ICD-10-CM | POA: Diagnosis not present

## 2022-07-04 DIAGNOSIS — I739 Peripheral vascular disease, unspecified: Secondary | ICD-10-CM | POA: Diagnosis not present

## 2022-07-04 DIAGNOSIS — I129 Hypertensive chronic kidney disease with stage 1 through stage 4 chronic kidney disease, or unspecified chronic kidney disease: Secondary | ICD-10-CM | POA: Diagnosis not present

## 2022-07-04 DIAGNOSIS — Z86718 Personal history of other venous thrombosis and embolism: Secondary | ICD-10-CM | POA: Diagnosis not present

## 2022-07-04 DIAGNOSIS — I69952 Hemiplegia and hemiparesis following unspecified cerebrovascular disease affecting left dominant side: Secondary | ICD-10-CM | POA: Diagnosis not present

## 2022-07-04 DIAGNOSIS — E785 Hyperlipidemia, unspecified: Secondary | ICD-10-CM | POA: Diagnosis not present

## 2022-07-04 DIAGNOSIS — Z7982 Long term (current) use of aspirin: Secondary | ICD-10-CM | POA: Diagnosis not present

## 2022-07-04 DIAGNOSIS — N1831 Chronic kidney disease, stage 3a: Secondary | ICD-10-CM | POA: Diagnosis not present

## 2022-07-06 DIAGNOSIS — I129 Hypertensive chronic kidney disease with stage 1 through stage 4 chronic kidney disease, or unspecified chronic kidney disease: Secondary | ICD-10-CM | POA: Diagnosis not present

## 2022-07-06 DIAGNOSIS — Z7982 Long term (current) use of aspirin: Secondary | ICD-10-CM | POA: Diagnosis not present

## 2022-07-06 DIAGNOSIS — N1831 Chronic kidney disease, stage 3a: Secondary | ICD-10-CM | POA: Diagnosis not present

## 2022-07-06 DIAGNOSIS — I69952 Hemiplegia and hemiparesis following unspecified cerebrovascular disease affecting left dominant side: Secondary | ICD-10-CM | POA: Diagnosis not present

## 2022-07-06 DIAGNOSIS — Z86718 Personal history of other venous thrombosis and embolism: Secondary | ICD-10-CM | POA: Diagnosis not present

## 2022-07-06 DIAGNOSIS — E785 Hyperlipidemia, unspecified: Secondary | ICD-10-CM | POA: Diagnosis not present

## 2022-07-06 DIAGNOSIS — Z7902 Long term (current) use of antithrombotics/antiplatelets: Secondary | ICD-10-CM | POA: Diagnosis not present

## 2022-07-06 DIAGNOSIS — K219 Gastro-esophageal reflux disease without esophagitis: Secondary | ICD-10-CM | POA: Diagnosis not present

## 2022-07-06 DIAGNOSIS — I739 Peripheral vascular disease, unspecified: Secondary | ICD-10-CM | POA: Diagnosis not present

## 2022-07-11 DIAGNOSIS — K219 Gastro-esophageal reflux disease without esophagitis: Secondary | ICD-10-CM | POA: Diagnosis not present

## 2022-07-11 DIAGNOSIS — Z7982 Long term (current) use of aspirin: Secondary | ICD-10-CM | POA: Diagnosis not present

## 2022-07-11 DIAGNOSIS — I129 Hypertensive chronic kidney disease with stage 1 through stage 4 chronic kidney disease, or unspecified chronic kidney disease: Secondary | ICD-10-CM | POA: Diagnosis not present

## 2022-07-11 DIAGNOSIS — Z7902 Long term (current) use of antithrombotics/antiplatelets: Secondary | ICD-10-CM | POA: Diagnosis not present

## 2022-07-11 DIAGNOSIS — E785 Hyperlipidemia, unspecified: Secondary | ICD-10-CM | POA: Diagnosis not present

## 2022-07-11 DIAGNOSIS — I69952 Hemiplegia and hemiparesis following unspecified cerebrovascular disease affecting left dominant side: Secondary | ICD-10-CM | POA: Diagnosis not present

## 2022-07-11 DIAGNOSIS — Z86718 Personal history of other venous thrombosis and embolism: Secondary | ICD-10-CM | POA: Diagnosis not present

## 2022-07-11 DIAGNOSIS — I739 Peripheral vascular disease, unspecified: Secondary | ICD-10-CM | POA: Diagnosis not present

## 2022-07-11 DIAGNOSIS — N1831 Chronic kidney disease, stage 3a: Secondary | ICD-10-CM | POA: Diagnosis not present

## 2022-07-13 DIAGNOSIS — Z7902 Long term (current) use of antithrombotics/antiplatelets: Secondary | ICD-10-CM | POA: Diagnosis not present

## 2022-07-13 DIAGNOSIS — K219 Gastro-esophageal reflux disease without esophagitis: Secondary | ICD-10-CM | POA: Diagnosis not present

## 2022-07-13 DIAGNOSIS — I739 Peripheral vascular disease, unspecified: Secondary | ICD-10-CM | POA: Diagnosis not present

## 2022-07-13 DIAGNOSIS — E785 Hyperlipidemia, unspecified: Secondary | ICD-10-CM | POA: Diagnosis not present

## 2022-07-13 DIAGNOSIS — I69952 Hemiplegia and hemiparesis following unspecified cerebrovascular disease affecting left dominant side: Secondary | ICD-10-CM | POA: Diagnosis not present

## 2022-07-13 DIAGNOSIS — I129 Hypertensive chronic kidney disease with stage 1 through stage 4 chronic kidney disease, or unspecified chronic kidney disease: Secondary | ICD-10-CM | POA: Diagnosis not present

## 2022-07-13 DIAGNOSIS — Z7982 Long term (current) use of aspirin: Secondary | ICD-10-CM | POA: Diagnosis not present

## 2022-07-13 DIAGNOSIS — N1831 Chronic kidney disease, stage 3a: Secondary | ICD-10-CM | POA: Diagnosis not present

## 2022-07-13 DIAGNOSIS — Z86718 Personal history of other venous thrombosis and embolism: Secondary | ICD-10-CM | POA: Diagnosis not present

## 2022-07-18 DIAGNOSIS — I69952 Hemiplegia and hemiparesis following unspecified cerebrovascular disease affecting left dominant side: Secondary | ICD-10-CM | POA: Diagnosis not present

## 2022-07-18 DIAGNOSIS — N1831 Chronic kidney disease, stage 3a: Secondary | ICD-10-CM | POA: Diagnosis not present

## 2022-07-18 DIAGNOSIS — Z7982 Long term (current) use of aspirin: Secondary | ICD-10-CM | POA: Diagnosis not present

## 2022-07-18 DIAGNOSIS — Z86718 Personal history of other venous thrombosis and embolism: Secondary | ICD-10-CM | POA: Diagnosis not present

## 2022-07-18 DIAGNOSIS — K219 Gastro-esophageal reflux disease without esophagitis: Secondary | ICD-10-CM | POA: Diagnosis not present

## 2022-07-18 DIAGNOSIS — I739 Peripheral vascular disease, unspecified: Secondary | ICD-10-CM | POA: Diagnosis not present

## 2022-07-18 DIAGNOSIS — E785 Hyperlipidemia, unspecified: Secondary | ICD-10-CM | POA: Diagnosis not present

## 2022-07-18 DIAGNOSIS — I129 Hypertensive chronic kidney disease with stage 1 through stage 4 chronic kidney disease, or unspecified chronic kidney disease: Secondary | ICD-10-CM | POA: Diagnosis not present

## 2022-07-18 DIAGNOSIS — Z7902 Long term (current) use of antithrombotics/antiplatelets: Secondary | ICD-10-CM | POA: Diagnosis not present

## 2022-07-21 DIAGNOSIS — I639 Cerebral infarction, unspecified: Secondary | ICD-10-CM | POA: Diagnosis not present

## 2022-07-21 DIAGNOSIS — I82409 Acute embolism and thrombosis of unspecified deep veins of unspecified lower extremity: Secondary | ICD-10-CM | POA: Diagnosis not present

## 2022-07-21 DIAGNOSIS — I10 Essential (primary) hypertension: Secondary | ICD-10-CM | POA: Diagnosis not present

## 2022-07-21 DIAGNOSIS — G8194 Hemiplegia, unspecified affecting left nondominant side: Secondary | ICD-10-CM | POA: Diagnosis not present

## 2022-07-21 DIAGNOSIS — N1831 Chronic kidney disease, stage 3a: Secondary | ICD-10-CM | POA: Diagnosis not present

## 2022-07-22 DIAGNOSIS — I69952 Hemiplegia and hemiparesis following unspecified cerebrovascular disease affecting left dominant side: Secondary | ICD-10-CM | POA: Diagnosis not present

## 2022-07-22 DIAGNOSIS — I739 Peripheral vascular disease, unspecified: Secondary | ICD-10-CM | POA: Diagnosis not present

## 2022-07-22 DIAGNOSIS — K219 Gastro-esophageal reflux disease without esophagitis: Secondary | ICD-10-CM | POA: Diagnosis not present

## 2022-07-22 DIAGNOSIS — E785 Hyperlipidemia, unspecified: Secondary | ICD-10-CM | POA: Diagnosis not present

## 2022-07-22 DIAGNOSIS — Z86718 Personal history of other venous thrombosis and embolism: Secondary | ICD-10-CM | POA: Diagnosis not present

## 2022-07-22 DIAGNOSIS — Z7902 Long term (current) use of antithrombotics/antiplatelets: Secondary | ICD-10-CM | POA: Diagnosis not present

## 2022-07-22 DIAGNOSIS — N1831 Chronic kidney disease, stage 3a: Secondary | ICD-10-CM | POA: Diagnosis not present

## 2022-07-22 DIAGNOSIS — I129 Hypertensive chronic kidney disease with stage 1 through stage 4 chronic kidney disease, or unspecified chronic kidney disease: Secondary | ICD-10-CM | POA: Diagnosis not present

## 2022-07-22 DIAGNOSIS — Z7982 Long term (current) use of aspirin: Secondary | ICD-10-CM | POA: Diagnosis not present

## 2022-07-25 DIAGNOSIS — I129 Hypertensive chronic kidney disease with stage 1 through stage 4 chronic kidney disease, or unspecified chronic kidney disease: Secondary | ICD-10-CM | POA: Diagnosis not present

## 2022-07-25 DIAGNOSIS — K219 Gastro-esophageal reflux disease without esophagitis: Secondary | ICD-10-CM | POA: Diagnosis not present

## 2022-07-25 DIAGNOSIS — Z7902 Long term (current) use of antithrombotics/antiplatelets: Secondary | ICD-10-CM | POA: Diagnosis not present

## 2022-07-25 DIAGNOSIS — I69952 Hemiplegia and hemiparesis following unspecified cerebrovascular disease affecting left dominant side: Secondary | ICD-10-CM | POA: Diagnosis not present

## 2022-07-25 DIAGNOSIS — E785 Hyperlipidemia, unspecified: Secondary | ICD-10-CM | POA: Diagnosis not present

## 2022-07-25 DIAGNOSIS — Z86718 Personal history of other venous thrombosis and embolism: Secondary | ICD-10-CM | POA: Diagnosis not present

## 2022-07-25 DIAGNOSIS — N1831 Chronic kidney disease, stage 3a: Secondary | ICD-10-CM | POA: Diagnosis not present

## 2022-07-25 DIAGNOSIS — I739 Peripheral vascular disease, unspecified: Secondary | ICD-10-CM | POA: Diagnosis not present

## 2022-07-25 DIAGNOSIS — Z7982 Long term (current) use of aspirin: Secondary | ICD-10-CM | POA: Diagnosis not present

## 2022-07-28 DIAGNOSIS — K219 Gastro-esophageal reflux disease without esophagitis: Secondary | ICD-10-CM | POA: Diagnosis not present

## 2022-07-28 DIAGNOSIS — N1831 Chronic kidney disease, stage 3a: Secondary | ICD-10-CM | POA: Diagnosis not present

## 2022-07-28 DIAGNOSIS — Z7902 Long term (current) use of antithrombotics/antiplatelets: Secondary | ICD-10-CM | POA: Diagnosis not present

## 2022-07-28 DIAGNOSIS — E785 Hyperlipidemia, unspecified: Secondary | ICD-10-CM | POA: Diagnosis not present

## 2022-07-28 DIAGNOSIS — Z86718 Personal history of other venous thrombosis and embolism: Secondary | ICD-10-CM | POA: Diagnosis not present

## 2022-07-28 DIAGNOSIS — I69952 Hemiplegia and hemiparesis following unspecified cerebrovascular disease affecting left dominant side: Secondary | ICD-10-CM | POA: Diagnosis not present

## 2022-07-28 DIAGNOSIS — Z7982 Long term (current) use of aspirin: Secondary | ICD-10-CM | POA: Diagnosis not present

## 2022-07-28 DIAGNOSIS — I129 Hypertensive chronic kidney disease with stage 1 through stage 4 chronic kidney disease, or unspecified chronic kidney disease: Secondary | ICD-10-CM | POA: Diagnosis not present

## 2022-07-28 DIAGNOSIS — I739 Peripheral vascular disease, unspecified: Secondary | ICD-10-CM | POA: Diagnosis not present

## 2022-08-01 DIAGNOSIS — G8194 Hemiplegia, unspecified affecting left nondominant side: Secondary | ICD-10-CM | POA: Diagnosis not present

## 2022-08-01 DIAGNOSIS — I10 Essential (primary) hypertension: Secondary | ICD-10-CM | POA: Diagnosis not present

## 2022-08-01 DIAGNOSIS — Z86718 Personal history of other venous thrombosis and embolism: Secondary | ICD-10-CM | POA: Diagnosis not present

## 2022-08-01 DIAGNOSIS — N1831 Chronic kidney disease, stage 3a: Secondary | ICD-10-CM | POA: Diagnosis not present

## 2022-08-01 DIAGNOSIS — Z79899 Other long term (current) drug therapy: Secondary | ICD-10-CM | POA: Diagnosis not present

## 2022-08-01 DIAGNOSIS — I639 Cerebral infarction, unspecified: Secondary | ICD-10-CM | POA: Diagnosis not present

## 2022-08-01 DIAGNOSIS — I70223 Atherosclerosis of native arteries of extremities with rest pain, bilateral legs: Secondary | ICD-10-CM | POA: Diagnosis not present

## 2022-08-01 DIAGNOSIS — E038 Other specified hypothyroidism: Secondary | ICD-10-CM | POA: Diagnosis not present

## 2022-08-01 DIAGNOSIS — E785 Hyperlipidemia, unspecified: Secondary | ICD-10-CM | POA: Diagnosis not present

## 2022-08-01 DIAGNOSIS — D518 Other vitamin B12 deficiency anemias: Secondary | ICD-10-CM | POA: Diagnosis not present

## 2022-08-01 DIAGNOSIS — Z7982 Long term (current) use of aspirin: Secondary | ICD-10-CM | POA: Diagnosis not present

## 2022-08-01 DIAGNOSIS — I219 Acute myocardial infarction, unspecified: Secondary | ICD-10-CM | POA: Diagnosis not present

## 2022-08-01 DIAGNOSIS — E7849 Other hyperlipidemia: Secondary | ICD-10-CM | POA: Diagnosis not present

## 2022-08-01 DIAGNOSIS — I69952 Hemiplegia and hemiparesis following unspecified cerebrovascular disease affecting left dominant side: Secondary | ICD-10-CM | POA: Diagnosis not present

## 2022-08-01 DIAGNOSIS — K219 Gastro-esophageal reflux disease without esophagitis: Secondary | ICD-10-CM | POA: Diagnosis not present

## 2022-08-01 DIAGNOSIS — E119 Type 2 diabetes mellitus without complications: Secondary | ICD-10-CM | POA: Diagnosis not present

## 2022-08-01 DIAGNOSIS — Z7902 Long term (current) use of antithrombotics/antiplatelets: Secondary | ICD-10-CM | POA: Diagnosis not present

## 2022-08-01 DIAGNOSIS — E559 Vitamin D deficiency, unspecified: Secondary | ICD-10-CM | POA: Diagnosis not present

## 2022-08-01 DIAGNOSIS — E782 Mixed hyperlipidemia: Secondary | ICD-10-CM | POA: Diagnosis not present

## 2022-08-01 DIAGNOSIS — I739 Peripheral vascular disease, unspecified: Secondary | ICD-10-CM | POA: Diagnosis not present

## 2022-08-01 DIAGNOSIS — I129 Hypertensive chronic kidney disease with stage 1 through stage 4 chronic kidney disease, or unspecified chronic kidney disease: Secondary | ICD-10-CM | POA: Diagnosis not present

## 2022-08-03 DIAGNOSIS — E785 Hyperlipidemia, unspecified: Secondary | ICD-10-CM | POA: Diagnosis not present

## 2022-08-03 DIAGNOSIS — Z7982 Long term (current) use of aspirin: Secondary | ICD-10-CM | POA: Diagnosis not present

## 2022-08-03 DIAGNOSIS — N1831 Chronic kidney disease, stage 3a: Secondary | ICD-10-CM | POA: Diagnosis not present

## 2022-08-03 DIAGNOSIS — K219 Gastro-esophageal reflux disease without esophagitis: Secondary | ICD-10-CM | POA: Diagnosis not present

## 2022-08-03 DIAGNOSIS — I129 Hypertensive chronic kidney disease with stage 1 through stage 4 chronic kidney disease, or unspecified chronic kidney disease: Secondary | ICD-10-CM | POA: Diagnosis not present

## 2022-08-03 DIAGNOSIS — I69952 Hemiplegia and hemiparesis following unspecified cerebrovascular disease affecting left dominant side: Secondary | ICD-10-CM | POA: Diagnosis not present

## 2022-08-03 DIAGNOSIS — Z7902 Long term (current) use of antithrombotics/antiplatelets: Secondary | ICD-10-CM | POA: Diagnosis not present

## 2022-08-03 DIAGNOSIS — Z86718 Personal history of other venous thrombosis and embolism: Secondary | ICD-10-CM | POA: Diagnosis not present

## 2022-08-03 DIAGNOSIS — I739 Peripheral vascular disease, unspecified: Secondary | ICD-10-CM | POA: Diagnosis not present

## 2022-08-08 DIAGNOSIS — I10 Essential (primary) hypertension: Secondary | ICD-10-CM | POA: Diagnosis not present

## 2022-08-10 DIAGNOSIS — E785 Hyperlipidemia, unspecified: Secondary | ICD-10-CM | POA: Diagnosis not present

## 2022-08-10 DIAGNOSIS — N1831 Chronic kidney disease, stage 3a: Secondary | ICD-10-CM | POA: Diagnosis not present

## 2022-08-10 DIAGNOSIS — Z86718 Personal history of other venous thrombosis and embolism: Secondary | ICD-10-CM | POA: Diagnosis not present

## 2022-08-10 DIAGNOSIS — Z7982 Long term (current) use of aspirin: Secondary | ICD-10-CM | POA: Diagnosis not present

## 2022-08-10 DIAGNOSIS — I739 Peripheral vascular disease, unspecified: Secondary | ICD-10-CM | POA: Diagnosis not present

## 2022-08-10 DIAGNOSIS — I69952 Hemiplegia and hemiparesis following unspecified cerebrovascular disease affecting left dominant side: Secondary | ICD-10-CM | POA: Diagnosis not present

## 2022-08-10 DIAGNOSIS — I129 Hypertensive chronic kidney disease with stage 1 through stage 4 chronic kidney disease, or unspecified chronic kidney disease: Secondary | ICD-10-CM | POA: Diagnosis not present

## 2022-08-10 DIAGNOSIS — K219 Gastro-esophageal reflux disease without esophagitis: Secondary | ICD-10-CM | POA: Diagnosis not present

## 2022-08-10 DIAGNOSIS — Z7902 Long term (current) use of antithrombotics/antiplatelets: Secondary | ICD-10-CM | POA: Diagnosis not present

## 2022-08-11 DIAGNOSIS — I69952 Hemiplegia and hemiparesis following unspecified cerebrovascular disease affecting left dominant side: Secondary | ICD-10-CM | POA: Diagnosis not present

## 2022-08-11 DIAGNOSIS — E785 Hyperlipidemia, unspecified: Secondary | ICD-10-CM | POA: Diagnosis not present

## 2022-08-11 DIAGNOSIS — Z7982 Long term (current) use of aspirin: Secondary | ICD-10-CM | POA: Diagnosis not present

## 2022-08-11 DIAGNOSIS — I129 Hypertensive chronic kidney disease with stage 1 through stage 4 chronic kidney disease, or unspecified chronic kidney disease: Secondary | ICD-10-CM | POA: Diagnosis not present

## 2022-08-11 DIAGNOSIS — Z86718 Personal history of other venous thrombosis and embolism: Secondary | ICD-10-CM | POA: Diagnosis not present

## 2022-08-11 DIAGNOSIS — I739 Peripheral vascular disease, unspecified: Secondary | ICD-10-CM | POA: Diagnosis not present

## 2022-08-11 DIAGNOSIS — N1831 Chronic kidney disease, stage 3a: Secondary | ICD-10-CM | POA: Diagnosis not present

## 2022-08-11 DIAGNOSIS — Z7902 Long term (current) use of antithrombotics/antiplatelets: Secondary | ICD-10-CM | POA: Diagnosis not present

## 2022-08-11 DIAGNOSIS — K219 Gastro-esophageal reflux disease without esophagitis: Secondary | ICD-10-CM | POA: Diagnosis not present

## 2022-08-15 DIAGNOSIS — I739 Peripheral vascular disease, unspecified: Secondary | ICD-10-CM | POA: Diagnosis not present

## 2022-08-15 DIAGNOSIS — K219 Gastro-esophageal reflux disease without esophagitis: Secondary | ICD-10-CM | POA: Diagnosis not present

## 2022-08-15 DIAGNOSIS — E785 Hyperlipidemia, unspecified: Secondary | ICD-10-CM | POA: Diagnosis not present

## 2022-08-15 DIAGNOSIS — Z7982 Long term (current) use of aspirin: Secondary | ICD-10-CM | POA: Diagnosis not present

## 2022-08-15 DIAGNOSIS — I69952 Hemiplegia and hemiparesis following unspecified cerebrovascular disease affecting left dominant side: Secondary | ICD-10-CM | POA: Diagnosis not present

## 2022-08-15 DIAGNOSIS — N1831 Chronic kidney disease, stage 3a: Secondary | ICD-10-CM | POA: Diagnosis not present

## 2022-08-15 DIAGNOSIS — Z86718 Personal history of other venous thrombosis and embolism: Secondary | ICD-10-CM | POA: Diagnosis not present

## 2022-08-15 DIAGNOSIS — I129 Hypertensive chronic kidney disease with stage 1 through stage 4 chronic kidney disease, or unspecified chronic kidney disease: Secondary | ICD-10-CM | POA: Diagnosis not present

## 2022-08-15 DIAGNOSIS — Z7902 Long term (current) use of antithrombotics/antiplatelets: Secondary | ICD-10-CM | POA: Diagnosis not present

## 2022-08-17 DIAGNOSIS — Z7982 Long term (current) use of aspirin: Secondary | ICD-10-CM | POA: Diagnosis not present

## 2022-08-17 DIAGNOSIS — I129 Hypertensive chronic kidney disease with stage 1 through stage 4 chronic kidney disease, or unspecified chronic kidney disease: Secondary | ICD-10-CM | POA: Diagnosis not present

## 2022-08-17 DIAGNOSIS — Z86718 Personal history of other venous thrombosis and embolism: Secondary | ICD-10-CM | POA: Diagnosis not present

## 2022-08-17 DIAGNOSIS — N1831 Chronic kidney disease, stage 3a: Secondary | ICD-10-CM | POA: Diagnosis not present

## 2022-08-17 DIAGNOSIS — I739 Peripheral vascular disease, unspecified: Secondary | ICD-10-CM | POA: Diagnosis not present

## 2022-08-17 DIAGNOSIS — Z7902 Long term (current) use of antithrombotics/antiplatelets: Secondary | ICD-10-CM | POA: Diagnosis not present

## 2022-08-17 DIAGNOSIS — K219 Gastro-esophageal reflux disease without esophagitis: Secondary | ICD-10-CM | POA: Diagnosis not present

## 2022-08-17 DIAGNOSIS — I69952 Hemiplegia and hemiparesis following unspecified cerebrovascular disease affecting left dominant side: Secondary | ICD-10-CM | POA: Diagnosis not present

## 2022-08-17 DIAGNOSIS — E785 Hyperlipidemia, unspecified: Secondary | ICD-10-CM | POA: Diagnosis not present

## 2022-08-22 DIAGNOSIS — Z86718 Personal history of other venous thrombosis and embolism: Secondary | ICD-10-CM | POA: Diagnosis not present

## 2022-08-22 DIAGNOSIS — E785 Hyperlipidemia, unspecified: Secondary | ICD-10-CM | POA: Diagnosis not present

## 2022-08-22 DIAGNOSIS — I739 Peripheral vascular disease, unspecified: Secondary | ICD-10-CM | POA: Diagnosis not present

## 2022-08-22 DIAGNOSIS — I129 Hypertensive chronic kidney disease with stage 1 through stage 4 chronic kidney disease, or unspecified chronic kidney disease: Secondary | ICD-10-CM | POA: Diagnosis not present

## 2022-08-22 DIAGNOSIS — N1831 Chronic kidney disease, stage 3a: Secondary | ICD-10-CM | POA: Diagnosis not present

## 2022-08-22 DIAGNOSIS — Z7982 Long term (current) use of aspirin: Secondary | ICD-10-CM | POA: Diagnosis not present

## 2022-08-22 DIAGNOSIS — I69952 Hemiplegia and hemiparesis following unspecified cerebrovascular disease affecting left dominant side: Secondary | ICD-10-CM | POA: Diagnosis not present

## 2022-08-22 DIAGNOSIS — Z7902 Long term (current) use of antithrombotics/antiplatelets: Secondary | ICD-10-CM | POA: Diagnosis not present

## 2022-08-22 DIAGNOSIS — K219 Gastro-esophageal reflux disease without esophagitis: Secondary | ICD-10-CM | POA: Diagnosis not present

## 2022-08-25 DIAGNOSIS — I129 Hypertensive chronic kidney disease with stage 1 through stage 4 chronic kidney disease, or unspecified chronic kidney disease: Secondary | ICD-10-CM | POA: Diagnosis not present

## 2022-08-25 DIAGNOSIS — K219 Gastro-esophageal reflux disease without esophagitis: Secondary | ICD-10-CM | POA: Diagnosis not present

## 2022-08-25 DIAGNOSIS — E785 Hyperlipidemia, unspecified: Secondary | ICD-10-CM | POA: Diagnosis not present

## 2022-08-25 DIAGNOSIS — Z86718 Personal history of other venous thrombosis and embolism: Secondary | ICD-10-CM | POA: Diagnosis not present

## 2022-08-25 DIAGNOSIS — Z7982 Long term (current) use of aspirin: Secondary | ICD-10-CM | POA: Diagnosis not present

## 2022-08-25 DIAGNOSIS — I69952 Hemiplegia and hemiparesis following unspecified cerebrovascular disease affecting left dominant side: Secondary | ICD-10-CM | POA: Diagnosis not present

## 2022-08-25 DIAGNOSIS — I739 Peripheral vascular disease, unspecified: Secondary | ICD-10-CM | POA: Diagnosis not present

## 2022-08-25 DIAGNOSIS — N1831 Chronic kidney disease, stage 3a: Secondary | ICD-10-CM | POA: Diagnosis not present

## 2022-08-25 DIAGNOSIS — Z7902 Long term (current) use of antithrombotics/antiplatelets: Secondary | ICD-10-CM | POA: Diagnosis not present

## 2022-08-30 DIAGNOSIS — K219 Gastro-esophageal reflux disease without esophagitis: Secondary | ICD-10-CM | POA: Diagnosis not present

## 2022-08-30 DIAGNOSIS — E782 Mixed hyperlipidemia: Secondary | ICD-10-CM | POA: Diagnosis not present

## 2022-08-30 DIAGNOSIS — I69952 Hemiplegia and hemiparesis following unspecified cerebrovascular disease affecting left dominant side: Secondary | ICD-10-CM | POA: Diagnosis not present

## 2022-08-30 DIAGNOSIS — I739 Peripheral vascular disease, unspecified: Secondary | ICD-10-CM | POA: Diagnosis not present

## 2022-08-30 DIAGNOSIS — Z86718 Personal history of other venous thrombosis and embolism: Secondary | ICD-10-CM | POA: Diagnosis not present

## 2022-08-30 DIAGNOSIS — I219 Acute myocardial infarction, unspecified: Secondary | ICD-10-CM | POA: Diagnosis not present

## 2022-08-30 DIAGNOSIS — E559 Vitamin D deficiency, unspecified: Secondary | ICD-10-CM | POA: Diagnosis not present

## 2022-08-30 DIAGNOSIS — E119 Type 2 diabetes mellitus without complications: Secondary | ICD-10-CM | POA: Diagnosis not present

## 2022-08-30 DIAGNOSIS — E038 Other specified hypothyroidism: Secondary | ICD-10-CM | POA: Diagnosis not present

## 2022-08-30 DIAGNOSIS — I70223 Atherosclerosis of native arteries of extremities with rest pain, bilateral legs: Secondary | ICD-10-CM | POA: Diagnosis not present

## 2022-08-30 DIAGNOSIS — I639 Cerebral infarction, unspecified: Secondary | ICD-10-CM | POA: Diagnosis not present

## 2022-08-30 DIAGNOSIS — Z7982 Long term (current) use of aspirin: Secondary | ICD-10-CM | POA: Diagnosis not present

## 2022-08-30 DIAGNOSIS — G8194 Hemiplegia, unspecified affecting left nondominant side: Secondary | ICD-10-CM | POA: Diagnosis not present

## 2022-08-30 DIAGNOSIS — E785 Hyperlipidemia, unspecified: Secondary | ICD-10-CM | POA: Diagnosis not present

## 2022-08-30 DIAGNOSIS — N1831 Chronic kidney disease, stage 3a: Secondary | ICD-10-CM | POA: Diagnosis not present

## 2022-08-30 DIAGNOSIS — E7849 Other hyperlipidemia: Secondary | ICD-10-CM | POA: Diagnosis not present

## 2022-08-30 DIAGNOSIS — I129 Hypertensive chronic kidney disease with stage 1 through stage 4 chronic kidney disease, or unspecified chronic kidney disease: Secondary | ICD-10-CM | POA: Diagnosis not present

## 2022-08-30 DIAGNOSIS — Z7902 Long term (current) use of antithrombotics/antiplatelets: Secondary | ICD-10-CM | POA: Diagnosis not present

## 2022-08-30 DIAGNOSIS — I10 Essential (primary) hypertension: Secondary | ICD-10-CM | POA: Diagnosis not present

## 2022-08-30 DIAGNOSIS — D518 Other vitamin B12 deficiency anemias: Secondary | ICD-10-CM | POA: Diagnosis not present

## 2022-09-06 DIAGNOSIS — I69952 Hemiplegia and hemiparesis following unspecified cerebrovascular disease affecting left dominant side: Secondary | ICD-10-CM | POA: Diagnosis not present

## 2022-09-06 DIAGNOSIS — K219 Gastro-esophageal reflux disease without esophagitis: Secondary | ICD-10-CM | POA: Diagnosis not present

## 2022-09-06 DIAGNOSIS — E785 Hyperlipidemia, unspecified: Secondary | ICD-10-CM | POA: Diagnosis not present

## 2022-09-06 DIAGNOSIS — I129 Hypertensive chronic kidney disease with stage 1 through stage 4 chronic kidney disease, or unspecified chronic kidney disease: Secondary | ICD-10-CM | POA: Diagnosis not present

## 2022-09-06 DIAGNOSIS — I739 Peripheral vascular disease, unspecified: Secondary | ICD-10-CM | POA: Diagnosis not present

## 2022-09-06 DIAGNOSIS — Z7982 Long term (current) use of aspirin: Secondary | ICD-10-CM | POA: Diagnosis not present

## 2022-09-06 DIAGNOSIS — N1831 Chronic kidney disease, stage 3a: Secondary | ICD-10-CM | POA: Diagnosis not present

## 2022-09-06 DIAGNOSIS — Z7902 Long term (current) use of antithrombotics/antiplatelets: Secondary | ICD-10-CM | POA: Diagnosis not present

## 2022-09-06 DIAGNOSIS — Z86718 Personal history of other venous thrombosis and embolism: Secondary | ICD-10-CM | POA: Diagnosis not present

## 2022-09-08 DIAGNOSIS — I10 Essential (primary) hypertension: Secondary | ICD-10-CM | POA: Diagnosis not present

## 2022-09-19 DIAGNOSIS — Z7902 Long term (current) use of antithrombotics/antiplatelets: Secondary | ICD-10-CM | POA: Diagnosis not present

## 2022-09-19 DIAGNOSIS — Z7982 Long term (current) use of aspirin: Secondary | ICD-10-CM | POA: Diagnosis not present

## 2022-09-19 DIAGNOSIS — E785 Hyperlipidemia, unspecified: Secondary | ICD-10-CM | POA: Diagnosis not present

## 2022-09-19 DIAGNOSIS — I69952 Hemiplegia and hemiparesis following unspecified cerebrovascular disease affecting left dominant side: Secondary | ICD-10-CM | POA: Diagnosis not present

## 2022-09-19 DIAGNOSIS — N1831 Chronic kidney disease, stage 3a: Secondary | ICD-10-CM | POA: Diagnosis not present

## 2022-09-19 DIAGNOSIS — I129 Hypertensive chronic kidney disease with stage 1 through stage 4 chronic kidney disease, or unspecified chronic kidney disease: Secondary | ICD-10-CM | POA: Diagnosis not present

## 2022-09-19 DIAGNOSIS — Z86718 Personal history of other venous thrombosis and embolism: Secondary | ICD-10-CM | POA: Diagnosis not present

## 2022-09-19 DIAGNOSIS — K219 Gastro-esophageal reflux disease without esophagitis: Secondary | ICD-10-CM | POA: Diagnosis not present

## 2022-09-19 DIAGNOSIS — I739 Peripheral vascular disease, unspecified: Secondary | ICD-10-CM | POA: Diagnosis not present

## 2022-09-23 DIAGNOSIS — Z86718 Personal history of other venous thrombosis and embolism: Secondary | ICD-10-CM | POA: Diagnosis not present

## 2022-09-23 DIAGNOSIS — Z7902 Long term (current) use of antithrombotics/antiplatelets: Secondary | ICD-10-CM | POA: Diagnosis not present

## 2022-09-23 DIAGNOSIS — Z7982 Long term (current) use of aspirin: Secondary | ICD-10-CM | POA: Diagnosis not present

## 2022-09-23 DIAGNOSIS — I739 Peripheral vascular disease, unspecified: Secondary | ICD-10-CM | POA: Diagnosis not present

## 2022-09-23 DIAGNOSIS — K219 Gastro-esophageal reflux disease without esophagitis: Secondary | ICD-10-CM | POA: Diagnosis not present

## 2022-09-23 DIAGNOSIS — I129 Hypertensive chronic kidney disease with stage 1 through stage 4 chronic kidney disease, or unspecified chronic kidney disease: Secondary | ICD-10-CM | POA: Diagnosis not present

## 2022-09-23 DIAGNOSIS — I69952 Hemiplegia and hemiparesis following unspecified cerebrovascular disease affecting left dominant side: Secondary | ICD-10-CM | POA: Diagnosis not present

## 2022-09-23 DIAGNOSIS — E785 Hyperlipidemia, unspecified: Secondary | ICD-10-CM | POA: Diagnosis not present

## 2022-09-23 DIAGNOSIS — N1831 Chronic kidney disease, stage 3a: Secondary | ICD-10-CM | POA: Diagnosis not present

## 2022-09-26 DIAGNOSIS — G8194 Hemiplegia, unspecified affecting left nondominant side: Secondary | ICD-10-CM | POA: Diagnosis not present

## 2022-09-26 DIAGNOSIS — I639 Cerebral infarction, unspecified: Secondary | ICD-10-CM | POA: Diagnosis not present

## 2022-09-26 DIAGNOSIS — D518 Other vitamin B12 deficiency anemias: Secondary | ICD-10-CM | POA: Diagnosis not present

## 2022-09-26 DIAGNOSIS — E7849 Other hyperlipidemia: Secondary | ICD-10-CM | POA: Diagnosis not present

## 2022-09-26 DIAGNOSIS — E038 Other specified hypothyroidism: Secondary | ICD-10-CM | POA: Diagnosis not present

## 2022-09-26 DIAGNOSIS — I219 Acute myocardial infarction, unspecified: Secondary | ICD-10-CM | POA: Diagnosis not present

## 2022-09-26 DIAGNOSIS — E559 Vitamin D deficiency, unspecified: Secondary | ICD-10-CM | POA: Diagnosis not present

## 2022-09-26 DIAGNOSIS — E782 Mixed hyperlipidemia: Secondary | ICD-10-CM | POA: Diagnosis not present

## 2022-09-26 DIAGNOSIS — I10 Essential (primary) hypertension: Secondary | ICD-10-CM | POA: Diagnosis not present

## 2022-09-26 DIAGNOSIS — E119 Type 2 diabetes mellitus without complications: Secondary | ICD-10-CM | POA: Diagnosis not present

## 2022-09-26 DIAGNOSIS — I70223 Atherosclerosis of native arteries of extremities with rest pain, bilateral legs: Secondary | ICD-10-CM | POA: Diagnosis not present

## 2022-09-27 DIAGNOSIS — I69952 Hemiplegia and hemiparesis following unspecified cerebrovascular disease affecting left dominant side: Secondary | ICD-10-CM | POA: Diagnosis not present

## 2022-09-27 DIAGNOSIS — Z86718 Personal history of other venous thrombosis and embolism: Secondary | ICD-10-CM | POA: Diagnosis not present

## 2022-09-27 DIAGNOSIS — E785 Hyperlipidemia, unspecified: Secondary | ICD-10-CM | POA: Diagnosis not present

## 2022-09-27 DIAGNOSIS — I129 Hypertensive chronic kidney disease with stage 1 through stage 4 chronic kidney disease, or unspecified chronic kidney disease: Secondary | ICD-10-CM | POA: Diagnosis not present

## 2022-09-27 DIAGNOSIS — I739 Peripheral vascular disease, unspecified: Secondary | ICD-10-CM | POA: Diagnosis not present

## 2022-09-27 DIAGNOSIS — K219 Gastro-esophageal reflux disease without esophagitis: Secondary | ICD-10-CM | POA: Diagnosis not present

## 2022-09-27 DIAGNOSIS — N1831 Chronic kidney disease, stage 3a: Secondary | ICD-10-CM | POA: Diagnosis not present

## 2022-09-27 DIAGNOSIS — Z7902 Long term (current) use of antithrombotics/antiplatelets: Secondary | ICD-10-CM | POA: Diagnosis not present

## 2022-09-27 DIAGNOSIS — Z7982 Long term (current) use of aspirin: Secondary | ICD-10-CM | POA: Diagnosis not present

## 2022-09-28 DIAGNOSIS — I739 Peripheral vascular disease, unspecified: Secondary | ICD-10-CM | POA: Diagnosis not present

## 2022-09-28 DIAGNOSIS — Z7902 Long term (current) use of antithrombotics/antiplatelets: Secondary | ICD-10-CM | POA: Diagnosis not present

## 2022-09-28 DIAGNOSIS — Z7982 Long term (current) use of aspirin: Secondary | ICD-10-CM | POA: Diagnosis not present

## 2022-09-28 DIAGNOSIS — I129 Hypertensive chronic kidney disease with stage 1 through stage 4 chronic kidney disease, or unspecified chronic kidney disease: Secondary | ICD-10-CM | POA: Diagnosis not present

## 2022-09-28 DIAGNOSIS — I69952 Hemiplegia and hemiparesis following unspecified cerebrovascular disease affecting left dominant side: Secondary | ICD-10-CM | POA: Diagnosis not present

## 2022-09-28 DIAGNOSIS — N1831 Chronic kidney disease, stage 3a: Secondary | ICD-10-CM | POA: Diagnosis not present

## 2022-09-28 DIAGNOSIS — E785 Hyperlipidemia, unspecified: Secondary | ICD-10-CM | POA: Diagnosis not present

## 2022-09-28 DIAGNOSIS — K219 Gastro-esophageal reflux disease without esophagitis: Secondary | ICD-10-CM | POA: Diagnosis not present

## 2022-09-28 DIAGNOSIS — Z86718 Personal history of other venous thrombosis and embolism: Secondary | ICD-10-CM | POA: Diagnosis not present

## 2022-10-03 DIAGNOSIS — Z7902 Long term (current) use of antithrombotics/antiplatelets: Secondary | ICD-10-CM | POA: Diagnosis not present

## 2022-10-03 DIAGNOSIS — I739 Peripheral vascular disease, unspecified: Secondary | ICD-10-CM | POA: Diagnosis not present

## 2022-10-03 DIAGNOSIS — K219 Gastro-esophageal reflux disease without esophagitis: Secondary | ICD-10-CM | POA: Diagnosis not present

## 2022-10-03 DIAGNOSIS — E785 Hyperlipidemia, unspecified: Secondary | ICD-10-CM | POA: Diagnosis not present

## 2022-10-03 DIAGNOSIS — N1831 Chronic kidney disease, stage 3a: Secondary | ICD-10-CM | POA: Diagnosis not present

## 2022-10-03 DIAGNOSIS — I129 Hypertensive chronic kidney disease with stage 1 through stage 4 chronic kidney disease, or unspecified chronic kidney disease: Secondary | ICD-10-CM | POA: Diagnosis not present

## 2022-10-03 DIAGNOSIS — I69952 Hemiplegia and hemiparesis following unspecified cerebrovascular disease affecting left dominant side: Secondary | ICD-10-CM | POA: Diagnosis not present

## 2022-10-03 DIAGNOSIS — Z7982 Long term (current) use of aspirin: Secondary | ICD-10-CM | POA: Diagnosis not present

## 2022-10-03 DIAGNOSIS — Z86718 Personal history of other venous thrombosis and embolism: Secondary | ICD-10-CM | POA: Diagnosis not present

## 2022-10-05 DIAGNOSIS — E785 Hyperlipidemia, unspecified: Secondary | ICD-10-CM | POA: Diagnosis not present

## 2022-10-05 DIAGNOSIS — Z7982 Long term (current) use of aspirin: Secondary | ICD-10-CM | POA: Diagnosis not present

## 2022-10-05 DIAGNOSIS — N1831 Chronic kidney disease, stage 3a: Secondary | ICD-10-CM | POA: Diagnosis not present

## 2022-10-05 DIAGNOSIS — I69952 Hemiplegia and hemiparesis following unspecified cerebrovascular disease affecting left dominant side: Secondary | ICD-10-CM | POA: Diagnosis not present

## 2022-10-05 DIAGNOSIS — I129 Hypertensive chronic kidney disease with stage 1 through stage 4 chronic kidney disease, or unspecified chronic kidney disease: Secondary | ICD-10-CM | POA: Diagnosis not present

## 2022-10-05 DIAGNOSIS — Z86718 Personal history of other venous thrombosis and embolism: Secondary | ICD-10-CM | POA: Diagnosis not present

## 2022-10-05 DIAGNOSIS — I739 Peripheral vascular disease, unspecified: Secondary | ICD-10-CM | POA: Diagnosis not present

## 2022-10-05 DIAGNOSIS — Z7902 Long term (current) use of antithrombotics/antiplatelets: Secondary | ICD-10-CM | POA: Diagnosis not present

## 2022-10-05 DIAGNOSIS — K219 Gastro-esophageal reflux disease without esophagitis: Secondary | ICD-10-CM | POA: Diagnosis not present

## 2022-10-08 DIAGNOSIS — I10 Essential (primary) hypertension: Secondary | ICD-10-CM | POA: Diagnosis not present

## 2022-10-10 DIAGNOSIS — I69952 Hemiplegia and hemiparesis following unspecified cerebrovascular disease affecting left dominant side: Secondary | ICD-10-CM | POA: Diagnosis not present

## 2022-10-10 DIAGNOSIS — E785 Hyperlipidemia, unspecified: Secondary | ICD-10-CM | POA: Diagnosis not present

## 2022-10-10 DIAGNOSIS — I739 Peripheral vascular disease, unspecified: Secondary | ICD-10-CM | POA: Diagnosis not present

## 2022-10-10 DIAGNOSIS — N1831 Chronic kidney disease, stage 3a: Secondary | ICD-10-CM | POA: Diagnosis not present

## 2022-10-10 DIAGNOSIS — I129 Hypertensive chronic kidney disease with stage 1 through stage 4 chronic kidney disease, or unspecified chronic kidney disease: Secondary | ICD-10-CM | POA: Diagnosis not present

## 2022-10-10 DIAGNOSIS — K219 Gastro-esophageal reflux disease without esophagitis: Secondary | ICD-10-CM | POA: Diagnosis not present

## 2022-10-10 DIAGNOSIS — Z86718 Personal history of other venous thrombosis and embolism: Secondary | ICD-10-CM | POA: Diagnosis not present

## 2022-10-10 DIAGNOSIS — Z7982 Long term (current) use of aspirin: Secondary | ICD-10-CM | POA: Diagnosis not present

## 2022-10-10 DIAGNOSIS — Z7902 Long term (current) use of antithrombotics/antiplatelets: Secondary | ICD-10-CM | POA: Diagnosis not present

## 2022-10-12 DIAGNOSIS — I69952 Hemiplegia and hemiparesis following unspecified cerebrovascular disease affecting left dominant side: Secondary | ICD-10-CM | POA: Diagnosis not present

## 2022-10-12 DIAGNOSIS — K219 Gastro-esophageal reflux disease without esophagitis: Secondary | ICD-10-CM | POA: Diagnosis not present

## 2022-10-12 DIAGNOSIS — E785 Hyperlipidemia, unspecified: Secondary | ICD-10-CM | POA: Diagnosis not present

## 2022-10-12 DIAGNOSIS — I739 Peripheral vascular disease, unspecified: Secondary | ICD-10-CM | POA: Diagnosis not present

## 2022-10-12 DIAGNOSIS — Z86718 Personal history of other venous thrombosis and embolism: Secondary | ICD-10-CM | POA: Diagnosis not present

## 2022-10-12 DIAGNOSIS — Z7982 Long term (current) use of aspirin: Secondary | ICD-10-CM | POA: Diagnosis not present

## 2022-10-12 DIAGNOSIS — Z7902 Long term (current) use of antithrombotics/antiplatelets: Secondary | ICD-10-CM | POA: Diagnosis not present

## 2022-10-12 DIAGNOSIS — N1831 Chronic kidney disease, stage 3a: Secondary | ICD-10-CM | POA: Diagnosis not present

## 2022-10-12 DIAGNOSIS — I129 Hypertensive chronic kidney disease with stage 1 through stage 4 chronic kidney disease, or unspecified chronic kidney disease: Secondary | ICD-10-CM | POA: Diagnosis not present

## 2022-10-19 DIAGNOSIS — I739 Peripheral vascular disease, unspecified: Secondary | ICD-10-CM | POA: Diagnosis not present

## 2022-10-19 DIAGNOSIS — Z86718 Personal history of other venous thrombosis and embolism: Secondary | ICD-10-CM | POA: Diagnosis not present

## 2022-10-19 DIAGNOSIS — I69952 Hemiplegia and hemiparesis following unspecified cerebrovascular disease affecting left dominant side: Secondary | ICD-10-CM | POA: Diagnosis not present

## 2022-10-19 DIAGNOSIS — K219 Gastro-esophageal reflux disease without esophagitis: Secondary | ICD-10-CM | POA: Diagnosis not present

## 2022-10-19 DIAGNOSIS — Z7902 Long term (current) use of antithrombotics/antiplatelets: Secondary | ICD-10-CM | POA: Diagnosis not present

## 2022-10-19 DIAGNOSIS — E785 Hyperlipidemia, unspecified: Secondary | ICD-10-CM | POA: Diagnosis not present

## 2022-10-19 DIAGNOSIS — N1831 Chronic kidney disease, stage 3a: Secondary | ICD-10-CM | POA: Diagnosis not present

## 2022-10-19 DIAGNOSIS — I129 Hypertensive chronic kidney disease with stage 1 through stage 4 chronic kidney disease, or unspecified chronic kidney disease: Secondary | ICD-10-CM | POA: Diagnosis not present

## 2022-10-19 DIAGNOSIS — Z7982 Long term (current) use of aspirin: Secondary | ICD-10-CM | POA: Diagnosis not present

## 2022-10-26 DIAGNOSIS — I69952 Hemiplegia and hemiparesis following unspecified cerebrovascular disease affecting left dominant side: Secondary | ICD-10-CM | POA: Diagnosis not present

## 2022-10-26 DIAGNOSIS — N1831 Chronic kidney disease, stage 3a: Secondary | ICD-10-CM | POA: Diagnosis not present

## 2022-10-26 DIAGNOSIS — I739 Peripheral vascular disease, unspecified: Secondary | ICD-10-CM | POA: Diagnosis not present

## 2022-10-26 DIAGNOSIS — K219 Gastro-esophageal reflux disease without esophagitis: Secondary | ICD-10-CM | POA: Diagnosis not present

## 2022-10-26 DIAGNOSIS — E785 Hyperlipidemia, unspecified: Secondary | ICD-10-CM | POA: Diagnosis not present

## 2022-10-26 DIAGNOSIS — Z7902 Long term (current) use of antithrombotics/antiplatelets: Secondary | ICD-10-CM | POA: Diagnosis not present

## 2022-10-26 DIAGNOSIS — Z86718 Personal history of other venous thrombosis and embolism: Secondary | ICD-10-CM | POA: Diagnosis not present

## 2022-10-26 DIAGNOSIS — I129 Hypertensive chronic kidney disease with stage 1 through stage 4 chronic kidney disease, or unspecified chronic kidney disease: Secondary | ICD-10-CM | POA: Diagnosis not present

## 2022-10-26 DIAGNOSIS — Z7982 Long term (current) use of aspirin: Secondary | ICD-10-CM | POA: Diagnosis not present

## 2022-11-04 DIAGNOSIS — E785 Hyperlipidemia, unspecified: Secondary | ICD-10-CM | POA: Diagnosis not present

## 2022-11-04 DIAGNOSIS — I129 Hypertensive chronic kidney disease with stage 1 through stage 4 chronic kidney disease, or unspecified chronic kidney disease: Secondary | ICD-10-CM | POA: Diagnosis not present

## 2022-11-04 DIAGNOSIS — E559 Vitamin D deficiency, unspecified: Secondary | ICD-10-CM | POA: Diagnosis not present

## 2022-11-04 DIAGNOSIS — E7849 Other hyperlipidemia: Secondary | ICD-10-CM | POA: Diagnosis not present

## 2022-11-04 DIAGNOSIS — I70223 Atherosclerosis of native arteries of extremities with rest pain, bilateral legs: Secondary | ICD-10-CM | POA: Diagnosis not present

## 2022-11-04 DIAGNOSIS — I639 Cerebral infarction, unspecified: Secondary | ICD-10-CM | POA: Diagnosis not present

## 2022-11-04 DIAGNOSIS — K219 Gastro-esophageal reflux disease without esophagitis: Secondary | ICD-10-CM | POA: Diagnosis not present

## 2022-11-04 DIAGNOSIS — I219 Acute myocardial infarction, unspecified: Secondary | ICD-10-CM | POA: Diagnosis not present

## 2022-11-04 DIAGNOSIS — Z7902 Long term (current) use of antithrombotics/antiplatelets: Secondary | ICD-10-CM | POA: Diagnosis not present

## 2022-11-04 DIAGNOSIS — E782 Mixed hyperlipidemia: Secondary | ICD-10-CM | POA: Diagnosis not present

## 2022-11-04 DIAGNOSIS — I739 Peripheral vascular disease, unspecified: Secondary | ICD-10-CM | POA: Diagnosis not present

## 2022-11-04 DIAGNOSIS — E038 Other specified hypothyroidism: Secondary | ICD-10-CM | POA: Diagnosis not present

## 2022-11-04 DIAGNOSIS — I69952 Hemiplegia and hemiparesis following unspecified cerebrovascular disease affecting left dominant side: Secondary | ICD-10-CM | POA: Diagnosis not present

## 2022-11-04 DIAGNOSIS — N1831 Chronic kidney disease, stage 3a: Secondary | ICD-10-CM | POA: Diagnosis not present

## 2022-11-04 DIAGNOSIS — I10 Essential (primary) hypertension: Secondary | ICD-10-CM | POA: Diagnosis not present

## 2022-11-04 DIAGNOSIS — D518 Other vitamin B12 deficiency anemias: Secondary | ICD-10-CM | POA: Diagnosis not present

## 2022-11-04 DIAGNOSIS — E119 Type 2 diabetes mellitus without complications: Secondary | ICD-10-CM | POA: Diagnosis not present

## 2022-11-04 DIAGNOSIS — Z7982 Long term (current) use of aspirin: Secondary | ICD-10-CM | POA: Diagnosis not present

## 2022-11-04 DIAGNOSIS — Z86718 Personal history of other venous thrombosis and embolism: Secondary | ICD-10-CM | POA: Diagnosis not present

## 2022-11-04 DIAGNOSIS — G8194 Hemiplegia, unspecified affecting left nondominant side: Secondary | ICD-10-CM | POA: Diagnosis not present

## 2022-11-07 DIAGNOSIS — Z79899 Other long term (current) drug therapy: Secondary | ICD-10-CM | POA: Diagnosis not present

## 2022-11-07 DIAGNOSIS — D518 Other vitamin B12 deficiency anemias: Secondary | ICD-10-CM | POA: Diagnosis not present

## 2022-11-07 DIAGNOSIS — E782 Mixed hyperlipidemia: Secondary | ICD-10-CM | POA: Diagnosis not present

## 2022-11-07 DIAGNOSIS — E119 Type 2 diabetes mellitus without complications: Secondary | ICD-10-CM | POA: Diagnosis not present

## 2022-11-07 DIAGNOSIS — E038 Other specified hypothyroidism: Secondary | ICD-10-CM | POA: Diagnosis not present

## 2022-11-08 DIAGNOSIS — I10 Essential (primary) hypertension: Secondary | ICD-10-CM | POA: Diagnosis not present

## 2022-11-22 DIAGNOSIS — I7091 Generalized atherosclerosis: Secondary | ICD-10-CM | POA: Diagnosis not present

## 2022-11-22 DIAGNOSIS — B351 Tinea unguium: Secondary | ICD-10-CM | POA: Diagnosis not present

## 2022-11-30 DIAGNOSIS — I5043 Acute on chronic combined systolic (congestive) and diastolic (congestive) heart failure: Secondary | ICD-10-CM | POA: Diagnosis not present

## 2022-11-30 DIAGNOSIS — I6930 Unspecified sequelae of cerebral infarction: Secondary | ICD-10-CM | POA: Diagnosis not present

## 2022-11-30 DIAGNOSIS — I2102 ST elevation (STEMI) myocardial infarction involving left anterior descending coronary artery: Secondary | ICD-10-CM | POA: Diagnosis not present

## 2022-11-30 DIAGNOSIS — I739 Peripheral vascular disease, unspecified: Secondary | ICD-10-CM | POA: Diagnosis not present

## 2022-11-30 DIAGNOSIS — I10 Essential (primary) hypertension: Secondary | ICD-10-CM | POA: Diagnosis not present

## 2022-11-30 DIAGNOSIS — Z955 Presence of coronary angioplasty implant and graft: Secondary | ICD-10-CM | POA: Diagnosis not present

## 2022-11-30 DIAGNOSIS — E782 Mixed hyperlipidemia: Secondary | ICD-10-CM | POA: Diagnosis not present

## 2022-11-30 DIAGNOSIS — R7303 Prediabetes: Secondary | ICD-10-CM | POA: Diagnosis not present

## 2022-11-30 DIAGNOSIS — I25118 Atherosclerotic heart disease of native coronary artery with other forms of angina pectoris: Secondary | ICD-10-CM | POA: Diagnosis not present

## 2022-12-02 DIAGNOSIS — E119 Type 2 diabetes mellitus without complications: Secondary | ICD-10-CM | POA: Diagnosis not present

## 2022-12-02 DIAGNOSIS — E7849 Other hyperlipidemia: Secondary | ICD-10-CM | POA: Diagnosis not present

## 2022-12-02 DIAGNOSIS — G8194 Hemiplegia, unspecified affecting left nondominant side: Secondary | ICD-10-CM | POA: Diagnosis not present

## 2022-12-02 DIAGNOSIS — D518 Other vitamin B12 deficiency anemias: Secondary | ICD-10-CM | POA: Diagnosis not present

## 2022-12-02 DIAGNOSIS — E038 Other specified hypothyroidism: Secondary | ICD-10-CM | POA: Diagnosis not present

## 2022-12-02 DIAGNOSIS — E782 Mixed hyperlipidemia: Secondary | ICD-10-CM | POA: Diagnosis not present

## 2022-12-02 DIAGNOSIS — I639 Cerebral infarction, unspecified: Secondary | ICD-10-CM | POA: Diagnosis not present

## 2022-12-02 DIAGNOSIS — I70223 Atherosclerosis of native arteries of extremities with rest pain, bilateral legs: Secondary | ICD-10-CM | POA: Diagnosis not present

## 2022-12-02 DIAGNOSIS — I10 Essential (primary) hypertension: Secondary | ICD-10-CM | POA: Diagnosis not present

## 2022-12-02 DIAGNOSIS — E559 Vitamin D deficiency, unspecified: Secondary | ICD-10-CM | POA: Diagnosis not present

## 2022-12-02 DIAGNOSIS — I219 Acute myocardial infarction, unspecified: Secondary | ICD-10-CM | POA: Diagnosis not present

## 2022-12-08 ENCOUNTER — Other Ambulatory Visit: Payer: Self-pay

## 2022-12-08 ENCOUNTER — Encounter: Payer: Self-pay | Admitting: Emergency Medicine

## 2022-12-08 ENCOUNTER — Emergency Department
Admission: EM | Admit: 2022-12-08 | Discharge: 2022-12-08 | Disposition: A | Discharge status: Home / Self Care | Payer: 59 | Attending: Emergency Medicine | Admitting: Emergency Medicine

## 2022-12-08 DIAGNOSIS — N390 Urinary tract infection, site not specified: Secondary | ICD-10-CM | POA: Diagnosis not present

## 2022-12-08 DIAGNOSIS — R82998 Other abnormal findings in urine: Secondary | ICD-10-CM | POA: Diagnosis present

## 2022-12-08 DIAGNOSIS — R279 Unspecified lack of coordination: Secondary | ICD-10-CM | POA: Diagnosis not present

## 2022-12-08 DIAGNOSIS — Z743 Need for continuous supervision: Secondary | ICD-10-CM | POA: Diagnosis not present

## 2022-12-08 LAB — URINALYSIS, ROUTINE W REFLEX MICROSCOPIC
Bilirubin Urine: NEGATIVE
Glucose, UA: NEGATIVE mg/dL
Ketones, ur: NEGATIVE mg/dL
Nitrite: POSITIVE — AB
Protein, ur: 30 mg/dL — AB
RBC / HPF: >50 RBC/hpf (ref 0–5)
Specific Gravity, Urine: 1.024 (ref 1.005–1.030)
pH: 5 (ref 5.0–8.0)

## 2022-12-08 LAB — BASIC METABOLIC PANEL
Anion gap: 7 (ref 5–15)
BUN: 27 mg/dL — ABNORMAL HIGH (ref 8–23)
CO2: 29 mmol/L (ref 22–32)
Calcium: 9.6 mg/dL (ref 8.9–10.3)
Chloride: 102 mmol/L (ref 98–111)
Creatinine, Ser: 1.29 mg/dL — ABNORMAL HIGH (ref 0.61–1.24)
GFR, Estimated: 54 mL/min — ABNORMAL LOW (ref >60)
Glucose, Bld: 118 mg/dL — ABNORMAL HIGH (ref 70–99)
Potassium: 3.6 mmol/L (ref 3.5–5.1)
Sodium: 138 mmol/L (ref 135–145)

## 2022-12-08 LAB — CBC
HCT: 38 % — ABNORMAL LOW (ref 39.0–52.0)
Hemoglobin: 12.1 g/dL — ABNORMAL LOW (ref 13.0–17.0)
MCH: 30.4 pg (ref 26.0–34.0)
MCHC: 31.8 g/dL (ref 30.0–36.0)
MCV: 95.5 fL (ref 80.0–100.0)
Platelets: 152 10*3/uL (ref 150–400)
RBC: 3.98 MIL/uL — ABNORMAL LOW (ref 4.22–5.81)
RDW: 14.3 % (ref 11.5–15.5)
WBC: 4.2 10*3/uL (ref 4.0–10.5)
nRBC: 0 % (ref 0.0–0.2)

## 2022-12-08 MED ORDER — CIPROFLOXACIN HCL 500 MG PO TABS
500.0000 mg | ORAL_TABLET | Freq: Once | ORAL | Status: AC
  Administered 2022-12-08: 500 mg via ORAL
  Filled 2022-12-08: qty 1

## 2022-12-08 MED ORDER — CIPROFLOXACIN HCL 500 MG PO TABS: 500.0000 mg | ORAL_TABLET | Freq: Two times a day (BID) | ORAL | 0 refills | Status: DC

## 2022-12-08 NOTE — ED Provider Notes (Signed)
[facility] Provider Note    Event Date/Time   First MD Initiated Contact with Patient 12/08/22 1501     (approximate)   History   Dark urine  HPI  Hunter Bailey is a 88 y.o. male from nursing home where he was noticed that his urine appeared darker than typical.  Concern for possible blood in the urine.  Afebrile, no other complaints.  Patient feels well and has no complaints at this time     Physical Exam   Triage Vital Signs: ED Triage Vitals  Encounter Vitals Group     BP 12/08/22 1433 (!) 120/58     Systolic BP Percentile --      Diastolic BP Percentile --      Pulse Rate 12/08/22 1433 62     Resp 12/08/22 1433 16     Temp 12/08/22 1433 99.1 F (37.3 C)     Temp Source 12/08/22 1433 Oral     SpO2 12/08/22 1433 99 %     Weight 12/08/22 1435 61.7 kg (136 lb)     Height 12/08/22 1435 1.727 m (5\' 8" )     Head Circumference --      Peak Flow --      Pain Score 12/08/22 1434 0     Pain Loc --      Pain Education --      Exclude from Growth Chart --     Most recent vital signs: Vitals:   12/08/22 1433 12/08/22 1852  BP: (!) 120/58 122/60  Pulse: 62 60  Resp: 16 16  Temp: 99.1 F (37.3 C) 98 F (36.7 C)  SpO2: 99% 99%     General: Awake, no distress.  CV:  Good peripheral perfusion.  Resp:  Normal effort.  Abd:  No distention.  Soft, nontender, no CVA tenderness Other:     ED Results / Procedures / Treatments   Labs (all labs ordered are listed, but only abnormal results are displayed) Labs Reviewed  CBC - Abnormal; Notable for the following components:      Result Value   RBC 3.98 (*)    Hemoglobin 12.1 (*)    HCT 38.0 (*)    All other components within normal limits  BASIC METABOLIC PANEL - Abnormal; Notable for the following components:   Glucose, Bld 118 (*)    BUN 27 (*)    Creatinine, Ser 1.29 (*)    GFR, Estimated 54 (*)    All other components within normal limits  URINALYSIS, ROUTINE W REFLEX  MICROSCOPIC - Abnormal; Notable for the following components:   Color, Urine YELLOW (*)    APPearance CLOUDY (*)    Hgb urine dipstick LARGE (*)    Protein, ur 30 (*)    Nitrite POSITIVE (*)    Leukocytes,Ua SMALL (*)    Bacteria, UA RARE (*)    All other components within normal limits     EKG     RADIOLOGY     PROCEDURES:  Critical Care performed:   Procedures   MEDICATIONS ORDERED IN ED: Medications  ciprofloxacin (CIPRO) tablet 500 mg (500 mg Oral Given 12/08/22 1755)     IMPRESSION / MDM / ASSESSMENT AND PLAN / ED COURSE  I reviewed the triage vital signs and the nursing notes. Patient's presentation is most consistent with acute complicated illness / injury requiring diagnostic workup.  Patient with hematuria as detailed above, lab work obtained which is overall reassuring, mild dehydration urinalysis is  consistent with UTI  No evidence of sepsis, will start the patient on p.o. antibiotics here, prescription provided, close outpatient follow-up recommended        FINAL CLINICAL IMPRESSION(S) / ED DIAGNOSES   Final diagnoses:  Lower urinary tract infectious disease     Rx / DC Orders   ED Discharge Orders          Ordered    ciprofloxacin (CIPRO) 500 MG tablet  2 times daily        12/08/22 1719             Note:  This document was prepared using Dragon voice recognition software and may include unintentional dictation errors.   Jene Every, MD 12/08/22 2036

## 2022-12-08 NOTE — ED Triage Notes (Signed)
Patient is a resident at [facility] and was sent here for "dark urine with red streaks"; He is on Plavix but patient otherwise has no complaints and is not having any pain

## 2022-12-09 ENCOUNTER — Emergency Department
Admission: EM | Admit: 2022-12-09 | Discharge: 2022-12-09 | Disposition: A | Discharge status: Home / Self Care | Payer: 59 | Attending: Emergency Medicine | Admitting: Emergency Medicine

## 2022-12-09 ENCOUNTER — Other Ambulatory Visit: Payer: Self-pay

## 2022-12-09 DIAGNOSIS — R319 Hematuria, unspecified: Secondary | ICD-10-CM | POA: Diagnosis not present

## 2022-12-09 DIAGNOSIS — Z743 Need for continuous supervision: Secondary | ICD-10-CM | POA: Diagnosis not present

## 2022-12-09 DIAGNOSIS — Z7401 Bed confinement status: Secondary | ICD-10-CM | POA: Diagnosis not present

## 2022-12-09 DIAGNOSIS — R29898 Other symptoms and signs involving the musculoskeletal system: Secondary | ICD-10-CM | POA: Diagnosis not present

## 2022-12-09 DIAGNOSIS — N3001 Acute cystitis with hematuria: Secondary | ICD-10-CM | POA: Diagnosis not present

## 2022-12-09 DIAGNOSIS — N390 Urinary tract infection, site not specified: Secondary | ICD-10-CM | POA: Diagnosis not present

## 2022-12-09 DIAGNOSIS — R310 Gross hematuria: Secondary | ICD-10-CM | POA: Diagnosis not present

## 2022-12-09 DIAGNOSIS — I10 Essential (primary) hypertension: Secondary | ICD-10-CM | POA: Diagnosis not present

## 2022-12-09 LAB — CBC
HCT: 35.8 % — ABNORMAL LOW (ref 39.0–52.0)
Hemoglobin: 11.8 g/dL — ABNORMAL LOW (ref 13.0–17.0)
MCH: 31.1 pg (ref 26.0–34.0)
MCHC: 33 g/dL (ref 30.0–36.0)
MCV: 94.2 fL (ref 80.0–100.0)
Platelets: 151 10*3/uL (ref 150–400)
RBC: 3.8 MIL/uL — ABNORMAL LOW (ref 4.22–5.81)
RDW: 14.3 % (ref 11.5–15.5)
WBC: 3.5 10*3/uL — ABNORMAL LOW (ref 4.0–10.5)
nRBC: 0 % (ref 0.0–0.2)

## 2022-12-09 LAB — BASIC METABOLIC PANEL
Anion gap: 7 (ref 5–15)
BUN: 29 mg/dL — ABNORMAL HIGH (ref 8–23)
CO2: 28 mmol/L (ref 22–32)
Calcium: 9.4 mg/dL (ref 8.9–10.3)
Chloride: 105 mmol/L (ref 98–111)
Creatinine, Ser: 1.2 mg/dL (ref 0.61–1.24)
GFR, Estimated: 59 mL/min — ABNORMAL LOW (ref >60)
Glucose, Bld: 123 mg/dL — ABNORMAL HIGH (ref 70–99)
Potassium: 3.6 mmol/L (ref 3.5–5.1)
Sodium: 140 mmol/L (ref 135–145)

## 2022-12-09 LAB — URINALYSIS, ROUTINE W REFLEX MICROSCOPIC
Bacteria, UA: NONE SEEN
RBC / HPF: >50 RBC/hpf (ref 0–5)
Squamous Epithelial / HPF: 0 /[HPF] (ref 0–5)

## 2022-12-09 LAB — SAMPLE TO BLOOD BANK

## 2022-12-09 MED ORDER — CIPROFLOXACIN HCL 500 MG PO TABS
500.0000 mg | ORAL_TABLET | Freq: Once | ORAL | Status: AC
  Administered 2022-12-09: 500 mg via ORAL
  Filled 2022-12-09: qty 1

## 2022-12-09 NOTE — ED Provider Notes (Signed)
-----------------------------------------   4:43 PM on 12/09/2022 -----------------------------------------  The patient was discharged by Dr. Vicente Males.  The ER noted an ST elevation on the monitor so obtained an EKG.  The EKG shows nonspecific ST abnormalities in the anterior and lateral leads.  The patient's only EKGs are from March 2023 when he was having an acute MI including lateral ST elevations.  The morphology is overall similar.  The current EKG is consistent with prior anterior/lateral infarct.  It does not meet STEMI criteria.  The patient is not having any chest pain or anginal close at this time.  There is no indication for further workup.  ED ECG REPORT I, Dionne Bucy, the attending physician, personally viewed and interpreted this ECG.  Date: 12/09/2022 EKG Time: 1640 Rate: 58 Rhythm: normal sinus rhythm QRS Axis: normal Intervals: normal ST/T Wave abnormalities: Nonspecific abnormalities Narrative Interpretation: Findings of old anterolateral infarct with no evidence of acute ischemia    Dionne Bucy, MD 12/09/22 1645

## 2022-12-09 NOTE — ED Triage Notes (Addendum)
Pt to ED via [facility] from [facility]. Pt was seen yesterday for UTI. Staff reports pt is now having hematuria that started around lunch time. EMS reports brief was soaked with blood and clots. Pt was prescribe medication for discharge but has not been filled yet. Pt denies pain. Pt denies blood thinners. MAR from facility shows pt is on Plavix and received dose this morning. Pt with hx of CVA with deficits and MI.   EMS VS: BP 119/60 100% RA HR 64

## 2022-12-09 NOTE — Discharge Instructions (Addendum)
Please start and continue taking your prescribed ciprofloxacin.  The hematuria should resolve as the UTI is treated

## 2022-12-09 NOTE — ED Notes (Signed)
RN called [facility] about picking up pt. [facility] said they did not have transport.

## 2022-12-09 NOTE — ED Provider Notes (Addendum)
[facility] Provider Note   Event Date/Time   First MD Initiated Contact with Patient 12/09/22 1305     (approximate) History  Hematuria  HPI Hunter Bailey is a 88 y.o. male who presents from a long-term care facility via EMS for hematuria after being discharged yesterday diagnosed with urinary tract infection.  Patient has not taken the first dosages of his antibiotics as they have not picked them up from the pharmacy yet.  EMS was called secondary to patient having hematuria and passing clots this morning.  Patient denies any complaints at this time ROS: Patient currently denies any vision changes, tinnitus, difficulty speaking, facial droop, sore throat, chest pain, shortness of breath, abdominal pain, nausea/vomiting/diarrhea, or weakness/numbness/paresthesias in any extremity   Physical Exam  Triage Vital Signs: ED Triage Vitals  Encounter Vitals Group     BP 12/09/22 1255 106/64     Systolic BP Percentile --      Diastolic BP Percentile --      Pulse Rate 12/09/22 1255 64     Resp 12/09/22 1255 17     Temp 12/09/22 1255 98.1 F (36.7 C)     Temp Source 12/09/22 1255 Oral     SpO2 12/09/22 1255 99 %     Weight 12/09/22 1256 135 lb 12.9 oz (61.6 kg)     Height 12/09/22 1256 5\' 8"  (1.727 m)     Head Circumference --      Peak Flow --      Pain Score 12/09/22 1255 0     Pain Loc --      Pain Education --      Exclude from Growth Chart --    Most recent vital signs: Vitals:   12/09/22 1500 12/09/22 1530  BP: 111/62 137/80  Pulse: 63 68  Resp:  18  Temp:    SpO2: 100% 100%   General: Awake, cooperative CV:  Good peripheral perfusion.  Resp:  Normal effort.  Abd:  No distention.  Other:  Elderly well-developed, well-nourished African-American male resting comfortably in no acute distress ED Results / Procedures / Treatments  Labs (all labs ordered are listed, but only abnormal results are displayed) Labs Reviewed  BASIC METABOLIC PANEL  - Abnormal; Notable for the following components:      Result Value   Glucose, Bld 123 (*)    BUN 29 (*)    GFR, Estimated 59 (*)    All other components within normal limits  CBC - Abnormal; Notable for the following components:   WBC 3.5 (*)    RBC 3.80 (*)    Hemoglobin 11.8 (*)    HCT 35.8 (*)    All other components within normal limits  URINALYSIS, ROUTINE W REFLEX MICROSCOPIC  SAMPLE TO BLOOD BANK  PROCEDURES: Critical Care performed: No Procedures MEDICATIONS ORDERED IN ED: Medications  ciprofloxacin (CIPRO) tablet 500 mg (500 mg Oral Given 12/09/22 1350)   IMPRESSION / MDM / ASSESSMENT AND PLAN / ED COURSE  I reviewed the triage vital signs and the nursing notes.                             The patient is on the cardiac monitor to evaluate for evidence of arrhythmia and/or significant heart rate changes. Patient's presentation is most consistent with acute presentation with potential threat to life or bodily function. No e/o epididymo-orchitis on exam and low suspicion for rectal abscess, prostatitis,  other GU deep space infection, gonorrhea/chlamydia. Unlikely Infected Urolithiasis, AAA, cholecystitis, pancreatitis, SBO, appendicitis, or other acute abdomen. Workup: UA: None Rx: 500 mg twice daily x5 days  Disposition: Discharge home. SRP discussed. Advise follow up with primary care provider within 24-72 hours.   FINAL CLINICAL IMPRESSION(S) / ED DIAGNOSES   Final diagnoses:  Urinary tract infection with hematuria, site unspecified  Gross hematuria   Rx / DC Orders   ED Discharge Orders     None      Note:  This document was prepared using Dragon voice recognition software and may include unintentional dictation errors.   Merwyn Katos, MD 12/09/22 1608    Merwyn Katos, MD 12/09/22 [phone]

## 2022-12-09 NOTE — ED Notes (Addendum)
Med necessity given to secretary called EMS for transport

## 2022-12-20 DIAGNOSIS — R011 Cardiac murmur, unspecified: Secondary | ICD-10-CM | POA: Diagnosis not present

## 2022-12-30 DIAGNOSIS — G8194 Hemiplegia, unspecified affecting left nondominant side: Secondary | ICD-10-CM | POA: Diagnosis not present

## 2022-12-30 DIAGNOSIS — E7849 Other hyperlipidemia: Secondary | ICD-10-CM | POA: Diagnosis not present

## 2022-12-30 DIAGNOSIS — E038 Other specified hypothyroidism: Secondary | ICD-10-CM | POA: Diagnosis not present

## 2022-12-30 DIAGNOSIS — I219 Acute myocardial infarction, unspecified: Secondary | ICD-10-CM | POA: Diagnosis not present

## 2022-12-30 DIAGNOSIS — I639 Cerebral infarction, unspecified: Secondary | ICD-10-CM | POA: Diagnosis not present

## 2022-12-30 DIAGNOSIS — E559 Vitamin D deficiency, unspecified: Secondary | ICD-10-CM | POA: Diagnosis not present

## 2022-12-30 DIAGNOSIS — I10 Essential (primary) hypertension: Secondary | ICD-10-CM | POA: Diagnosis not present

## 2022-12-30 DIAGNOSIS — E782 Mixed hyperlipidemia: Secondary | ICD-10-CM | POA: Diagnosis not present

## 2022-12-30 DIAGNOSIS — D518 Other vitamin B12 deficiency anemias: Secondary | ICD-10-CM | POA: Diagnosis not present

## 2022-12-30 DIAGNOSIS — E119 Type 2 diabetes mellitus without complications: Secondary | ICD-10-CM | POA: Diagnosis not present

## 2022-12-30 DIAGNOSIS — I70223 Atherosclerosis of native arteries of extremities with rest pain, bilateral legs: Secondary | ICD-10-CM | POA: Diagnosis not present

## 2023-01-08 DIAGNOSIS — I10 Essential (primary) hypertension: Secondary | ICD-10-CM | POA: Diagnosis not present

## 2023-01-13 DIAGNOSIS — I70223 Atherosclerosis of native arteries of extremities with rest pain, bilateral legs: Secondary | ICD-10-CM | POA: Diagnosis not present

## 2023-01-16 DIAGNOSIS — R0989 Other specified symptoms and signs involving the circulatory and respiratory systems: Secondary | ICD-10-CM | POA: Diagnosis not present

## 2023-01-16 DIAGNOSIS — I6523 Occlusion and stenosis of bilateral carotid arteries: Secondary | ICD-10-CM | POA: Diagnosis not present

## 2023-01-18 DIAGNOSIS — I77811 Abdominal aortic ectasia: Secondary | ICD-10-CM | POA: Diagnosis not present

## 2023-02-01 DIAGNOSIS — G8194 Hemiplegia, unspecified affecting left nondominant side: Secondary | ICD-10-CM | POA: Diagnosis not present

## 2023-02-01 DIAGNOSIS — I10 Essential (primary) hypertension: Secondary | ICD-10-CM | POA: Diagnosis not present

## 2023-02-01 DIAGNOSIS — D518 Other vitamin B12 deficiency anemias: Secondary | ICD-10-CM | POA: Diagnosis not present

## 2023-02-01 DIAGNOSIS — E7849 Other hyperlipidemia: Secondary | ICD-10-CM | POA: Diagnosis not present

## 2023-02-01 DIAGNOSIS — I70223 Atherosclerosis of native arteries of extremities with rest pain, bilateral legs: Secondary | ICD-10-CM | POA: Diagnosis not present

## 2023-02-01 DIAGNOSIS — E119 Type 2 diabetes mellitus without complications: Secondary | ICD-10-CM | POA: Diagnosis not present

## 2023-02-01 DIAGNOSIS — I639 Cerebral infarction, unspecified: Secondary | ICD-10-CM | POA: Diagnosis not present

## 2023-02-01 DIAGNOSIS — E782 Mixed hyperlipidemia: Secondary | ICD-10-CM | POA: Diagnosis not present

## 2023-02-01 DIAGNOSIS — E559 Vitamin D deficiency, unspecified: Secondary | ICD-10-CM | POA: Diagnosis not present

## 2023-02-01 DIAGNOSIS — E038 Other specified hypothyroidism: Secondary | ICD-10-CM | POA: Diagnosis not present

## 2023-02-01 DIAGNOSIS — I219 Acute myocardial infarction, unspecified: Secondary | ICD-10-CM | POA: Diagnosis not present

## 2023-02-06 DIAGNOSIS — D519 Vitamin B12 deficiency anemia, unspecified: Secondary | ICD-10-CM | POA: Diagnosis not present

## 2023-02-06 DIAGNOSIS — Z79899 Other long term (current) drug therapy: Secondary | ICD-10-CM | POA: Diagnosis not present

## 2023-02-06 DIAGNOSIS — E119 Type 2 diabetes mellitus without complications: Secondary | ICD-10-CM | POA: Diagnosis not present

## 2023-02-06 DIAGNOSIS — E038 Other specified hypothyroidism: Secondary | ICD-10-CM | POA: Diagnosis not present

## 2023-02-06 DIAGNOSIS — E782 Mixed hyperlipidemia: Secondary | ICD-10-CM | POA: Diagnosis not present

## 2023-02-08 DIAGNOSIS — I10 Essential (primary) hypertension: Secondary | ICD-10-CM | POA: Diagnosis not present

## 2023-02-13 DIAGNOSIS — I639 Cerebral infarction, unspecified: Secondary | ICD-10-CM | POA: Diagnosis not present

## 2023-02-13 DIAGNOSIS — G8194 Hemiplegia, unspecified affecting left nondominant side: Secondary | ICD-10-CM | POA: Diagnosis not present

## 2023-02-13 DIAGNOSIS — I739 Peripheral vascular disease, unspecified: Secondary | ICD-10-CM | POA: Diagnosis not present

## 2023-02-13 DIAGNOSIS — I82409 Acute embolism and thrombosis of unspecified deep veins of unspecified lower extremity: Secondary | ICD-10-CM | POA: Diagnosis not present

## 2023-02-13 DIAGNOSIS — K219 Gastro-esophageal reflux disease without esophagitis: Secondary | ICD-10-CM | POA: Diagnosis not present

## 2023-02-13 DIAGNOSIS — I10 Essential (primary) hypertension: Secondary | ICD-10-CM | POA: Diagnosis not present

## 2023-02-25 DIAGNOSIS — I639 Cerebral infarction, unspecified: Secondary | ICD-10-CM | POA: Diagnosis not present

## 2023-02-25 DIAGNOSIS — D518 Other vitamin B12 deficiency anemias: Secondary | ICD-10-CM | POA: Diagnosis not present

## 2023-02-25 DIAGNOSIS — I10 Essential (primary) hypertension: Secondary | ICD-10-CM | POA: Diagnosis not present

## 2023-02-25 DIAGNOSIS — E782 Mixed hyperlipidemia: Secondary | ICD-10-CM | POA: Diagnosis not present

## 2023-02-25 DIAGNOSIS — E7849 Other hyperlipidemia: Secondary | ICD-10-CM | POA: Diagnosis not present

## 2023-02-25 DIAGNOSIS — I70223 Atherosclerosis of native arteries of extremities with rest pain, bilateral legs: Secondary | ICD-10-CM | POA: Diagnosis not present

## 2023-02-25 DIAGNOSIS — E119 Type 2 diabetes mellitus without complications: Secondary | ICD-10-CM | POA: Diagnosis not present

## 2023-02-25 DIAGNOSIS — I219 Acute myocardial infarction, unspecified: Secondary | ICD-10-CM | POA: Diagnosis not present

## 2023-02-25 DIAGNOSIS — G8194 Hemiplegia, unspecified affecting left nondominant side: Secondary | ICD-10-CM | POA: Diagnosis not present

## 2023-02-25 DIAGNOSIS — E559 Vitamin D deficiency, unspecified: Secondary | ICD-10-CM | POA: Diagnosis not present

## 2023-02-25 DIAGNOSIS — E038 Other specified hypothyroidism: Secondary | ICD-10-CM | POA: Diagnosis not present

## 2023-03-10 DIAGNOSIS — I10 Essential (primary) hypertension: Secondary | ICD-10-CM | POA: Diagnosis not present

## 2023-03-13 DIAGNOSIS — G8194 Hemiplegia, unspecified affecting left nondominant side: Secondary | ICD-10-CM | POA: Diagnosis not present

## 2023-03-13 DIAGNOSIS — I82409 Acute embolism and thrombosis of unspecified deep veins of unspecified lower extremity: Secondary | ICD-10-CM | POA: Diagnosis not present

## 2023-03-13 DIAGNOSIS — I10 Essential (primary) hypertension: Secondary | ICD-10-CM | POA: Diagnosis not present

## 2023-03-13 DIAGNOSIS — I639 Cerebral infarction, unspecified: Secondary | ICD-10-CM | POA: Diagnosis not present

## 2023-03-13 DIAGNOSIS — N1831 Chronic kidney disease, stage 3a: Secondary | ICD-10-CM | POA: Diagnosis not present

## 2023-03-13 DIAGNOSIS — K5901 Slow transit constipation: Secondary | ICD-10-CM | POA: Diagnosis not present

## 2023-03-13 DIAGNOSIS — I739 Peripheral vascular disease, unspecified: Secondary | ICD-10-CM | POA: Diagnosis not present

## 2023-03-13 DIAGNOSIS — K219 Gastro-esophageal reflux disease without esophagitis: Secondary | ICD-10-CM | POA: Diagnosis not present

## 2023-03-13 DIAGNOSIS — N3289 Other specified disorders of bladder: Secondary | ICD-10-CM | POA: Diagnosis not present

## 2023-03-16 DIAGNOSIS — N390 Urinary tract infection, site not specified: Secondary | ICD-10-CM | POA: Diagnosis not present

## 2023-03-18 ENCOUNTER — Other Ambulatory Visit: Payer: Self-pay

## 2023-03-18 ENCOUNTER — Inpatient Hospital Stay
Admission: EM | Admit: 2023-03-18 | Discharge: 2023-04-10 | DRG: 640 | Disposition: A | Discharge status: Hospice | Payer: 59 | Source: Other Acute Inpatient Hospital | Attending: Internal Medicine | Admitting: Internal Medicine

## 2023-03-18 DIAGNOSIS — D696 Thrombocytopenia, unspecified: Secondary | ICD-10-CM | POA: Diagnosis not present

## 2023-03-18 DIAGNOSIS — R54 Age-related physical debility: Secondary | ICD-10-CM | POA: Diagnosis present

## 2023-03-18 DIAGNOSIS — E876 Hypokalemia: Secondary | ICD-10-CM | POA: Diagnosis present

## 2023-03-18 DIAGNOSIS — N281 Cyst of kidney, acquired: Secondary | ICD-10-CM | POA: Diagnosis not present

## 2023-03-18 DIAGNOSIS — D518 Other vitamin B12 deficiency anemias: Secondary | ICD-10-CM | POA: Diagnosis not present

## 2023-03-18 DIAGNOSIS — Z66 Do not resuscitate: Secondary | ICD-10-CM | POA: Diagnosis not present

## 2023-03-18 DIAGNOSIS — R627 Adult failure to thrive: Secondary | ICD-10-CM | POA: Diagnosis not present

## 2023-03-18 DIAGNOSIS — E119 Type 2 diabetes mellitus without complications: Secondary | ICD-10-CM | POA: Diagnosis present

## 2023-03-18 DIAGNOSIS — R9089 Other abnormal findings on diagnostic imaging of central nervous system: Secondary | ICD-10-CM | POA: Diagnosis not present

## 2023-03-18 DIAGNOSIS — Z8673 Personal history of transient ischemic attack (TIA), and cerebral infarction without residual deficits: Secondary | ICD-10-CM | POA: Diagnosis not present

## 2023-03-18 DIAGNOSIS — N179 Acute kidney failure, unspecified: Secondary | ICD-10-CM | POA: Diagnosis present

## 2023-03-18 DIAGNOSIS — E871 Hypo-osmolality and hyponatremia: Secondary | ICD-10-CM | POA: Diagnosis present

## 2023-03-18 DIAGNOSIS — C9000 Multiple myeloma not having achieved remission: Secondary | ICD-10-CM

## 2023-03-18 DIAGNOSIS — Z515 Encounter for palliative care: Secondary | ICD-10-CM

## 2023-03-18 DIAGNOSIS — B370 Candidal stomatitis: Secondary | ICD-10-CM

## 2023-03-18 DIAGNOSIS — D759 Disease of blood and blood-forming organs, unspecified: Secondary | ICD-10-CM | POA: Diagnosis not present

## 2023-03-18 DIAGNOSIS — E860 Dehydration: Secondary | ICD-10-CM | POA: Diagnosis present

## 2023-03-18 DIAGNOSIS — M160 Bilateral primary osteoarthritis of hip: Secondary | ICD-10-CM | POA: Diagnosis not present

## 2023-03-18 DIAGNOSIS — T451X5A Adverse effect of antineoplastic and immunosuppressive drugs, initial encounter: Secondary | ICD-10-CM | POA: Diagnosis not present

## 2023-03-18 DIAGNOSIS — N400 Enlarged prostate without lower urinary tract symptoms: Secondary | ICD-10-CM | POA: Diagnosis present

## 2023-03-18 DIAGNOSIS — Z87891 Personal history of nicotine dependence: Secondary | ICD-10-CM

## 2023-03-18 DIAGNOSIS — E870 Hyperosmolality and hypernatremia: Secondary | ICD-10-CM | POA: Diagnosis present

## 2023-03-18 DIAGNOSIS — D6481 Anemia due to antineoplastic chemotherapy: Secondary | ICD-10-CM

## 2023-03-18 DIAGNOSIS — R64 Cachexia: Secondary | ICD-10-CM | POA: Diagnosis not present

## 2023-03-18 DIAGNOSIS — I639 Cerebral infarction, unspecified: Secondary | ICD-10-CM | POA: Diagnosis not present

## 2023-03-18 DIAGNOSIS — G9341 Metabolic encephalopathy: Secondary | ICD-10-CM | POA: Diagnosis not present

## 2023-03-18 DIAGNOSIS — D630 Anemia in neoplastic disease: Secondary | ICD-10-CM | POA: Diagnosis present

## 2023-03-18 DIAGNOSIS — I10 Essential (primary) hypertension: Secondary | ICD-10-CM | POA: Diagnosis present

## 2023-03-18 DIAGNOSIS — I2510 Atherosclerotic heart disease of native coronary artery without angina pectoris: Secondary | ICD-10-CM | POA: Diagnosis present

## 2023-03-18 DIAGNOSIS — E43 Unspecified severe protein-calorie malnutrition: Secondary | ICD-10-CM | POA: Diagnosis not present

## 2023-03-18 DIAGNOSIS — I252 Old myocardial infarction: Secondary | ICD-10-CM

## 2023-03-18 DIAGNOSIS — I4891 Unspecified atrial fibrillation: Secondary | ICD-10-CM | POA: Diagnosis not present

## 2023-03-18 DIAGNOSIS — G8194 Hemiplegia, unspecified affecting left nondominant side: Secondary | ICD-10-CM | POA: Diagnosis not present

## 2023-03-18 DIAGNOSIS — E785 Hyperlipidemia, unspecified: Secondary | ICD-10-CM | POA: Diagnosis present

## 2023-03-18 DIAGNOSIS — N3289 Other specified disorders of bladder: Secondary | ICD-10-CM | POA: Diagnosis not present

## 2023-03-18 DIAGNOSIS — R1310 Dysphagia, unspecified: Secondary | ICD-10-CM | POA: Diagnosis not present

## 2023-03-18 DIAGNOSIS — K5901 Slow transit constipation: Secondary | ICD-10-CM | POA: Diagnosis not present

## 2023-03-18 DIAGNOSIS — R404 Transient alteration of awareness: Secondary | ICD-10-CM | POA: Diagnosis not present

## 2023-03-18 DIAGNOSIS — Z79899 Other long term (current) drug therapy: Secondary | ICD-10-CM

## 2023-03-18 DIAGNOSIS — Z743 Need for continuous supervision: Secondary | ICD-10-CM | POA: Diagnosis not present

## 2023-03-18 DIAGNOSIS — Z681 Body mass index (BMI) 19 or less, adult: Secondary | ICD-10-CM | POA: Diagnosis not present

## 2023-03-18 DIAGNOSIS — N1831 Chronic kidney disease, stage 3a: Secondary | ICD-10-CM | POA: Diagnosis not present

## 2023-03-18 DIAGNOSIS — I739 Peripheral vascular disease, unspecified: Secondary | ICD-10-CM | POA: Diagnosis not present

## 2023-03-18 DIAGNOSIS — R1312 Dysphagia, oropharyngeal phase: Secondary | ICD-10-CM | POA: Diagnosis present

## 2023-03-18 DIAGNOSIS — M170 Bilateral primary osteoarthritis of knee: Secondary | ICD-10-CM | POA: Diagnosis not present

## 2023-03-18 DIAGNOSIS — I69354 Hemiplegia and hemiparesis following cerebral infarction affecting left non-dominant side: Secondary | ICD-10-CM

## 2023-03-18 DIAGNOSIS — Z880 Allergy status to penicillin: Secondary | ICD-10-CM

## 2023-03-18 DIAGNOSIS — N390 Urinary tract infection, site not specified: Secondary | ICD-10-CM | POA: Diagnosis not present

## 2023-03-18 DIAGNOSIS — E8809 Other disorders of plasma-protein metabolism, not elsewhere classified: Secondary | ICD-10-CM | POA: Diagnosis not present

## 2023-03-18 DIAGNOSIS — D519 Vitamin B12 deficiency anemia, unspecified: Secondary | ICD-10-CM | POA: Diagnosis present

## 2023-03-18 DIAGNOSIS — D649 Anemia, unspecified: Secondary | ICD-10-CM | POA: Diagnosis not present

## 2023-03-18 DIAGNOSIS — G9389 Other specified disorders of brain: Secondary | ICD-10-CM | POA: Diagnosis not present

## 2023-03-18 DIAGNOSIS — R6889 Other general symptoms and signs: Secondary | ICD-10-CM | POA: Diagnosis not present

## 2023-03-18 DIAGNOSIS — R29818 Other symptoms and signs involving the nervous system: Secondary | ICD-10-CM | POA: Diagnosis not present

## 2023-03-18 DIAGNOSIS — N23 Unspecified renal colic: Secondary | ICD-10-CM | POA: Diagnosis not present

## 2023-03-18 DIAGNOSIS — R0902 Hypoxemia: Secondary | ICD-10-CM | POA: Diagnosis not present

## 2023-03-18 DIAGNOSIS — R531 Weakness: Secondary | ICD-10-CM | POA: Diagnosis not present

## 2023-03-18 LAB — CBC WITH DIFFERENTIAL/PLATELET
Abs Immature Granulocytes: 0.04 10*3/uL (ref 0.00–0.07)
Basophils Absolute: 0 10*3/uL (ref 0.0–0.1)
Basophils Relative: 0 %
Eosinophils Absolute: 0 10*3/uL (ref 0.0–0.5)
Eosinophils Relative: 0 %
HCT: 30.3 % — ABNORMAL LOW (ref 39.0–52.0)
Hemoglobin: 9.7 g/dL — ABNORMAL LOW (ref 13.0–17.0)
Immature Granulocytes: 1 %
Lymphocytes Relative: 24 %
Lymphs Abs: 1.3 10*3/uL (ref 0.7–4.0)
MCH: 30.7 pg (ref 26.0–34.0)
MCHC: 32 g/dL (ref 30.0–36.0)
MCV: 95.9 fL (ref 80.0–100.0)
Monocytes Absolute: 0.4 10*3/uL (ref 0.1–1.0)
Monocytes Relative: 7 %
Neutro Abs: 3.8 10*3/uL (ref 1.7–7.7)
Neutrophils Relative %: 68 %
Platelets: 114 10*3/uL — ABNORMAL LOW (ref 150–400)
RBC: 3.16 MIL/uL — ABNORMAL LOW (ref 4.22–5.81)
RDW: 16.2 % — ABNORMAL HIGH (ref 11.5–15.5)
WBC: 5.6 10*3/uL (ref 4.0–10.5)
nRBC: 0 % (ref 0.0–0.2)

## 2023-03-18 LAB — IRON AND TIBC
Iron: 72 ug/dL (ref 45–182)
Saturation Ratios: 33 % (ref 17.9–39.5)
TIBC: 216 ug/dL — ABNORMAL LOW (ref 250–450)
UIBC: 144 ug/dL

## 2023-03-18 LAB — COMPREHENSIVE METABOLIC PANEL
ALT: 17 U/L (ref 0–44)
AST: 21 U/L (ref 15–41)
Albumin: 3.1 g/dL — ABNORMAL LOW (ref 3.5–5.0)
Alkaline Phosphatase: 34 U/L — ABNORMAL LOW (ref 38–126)
Anion gap: 6 (ref 5–15)
BUN: 73 mg/dL — ABNORMAL HIGH (ref 8–23)
CO2: 26 mmol/L (ref 22–32)
Calcium: 13 mg/dL — ABNORMAL HIGH (ref 8.9–10.3)
Chloride: 98 mmol/L (ref 98–111)
Creatinine, Ser: 2.67 mg/dL — ABNORMAL HIGH (ref 0.61–1.24)
GFR, Estimated: 23 mL/min — ABNORMAL LOW (ref >60)
Glucose, Bld: 98 mg/dL (ref 70–99)
Potassium: 3.3 mmol/L — ABNORMAL LOW (ref 3.5–5.1)
Sodium: 130 mmol/L — ABNORMAL LOW (ref 135–145)
Total Bilirubin: 0.8 mg/dL (ref 0.0–1.2)
Total Protein: >12 g/dL — ABNORMAL HIGH (ref 6.5–8.1)

## 2023-03-18 LAB — FERRITIN: Ferritin: 246 ng/mL (ref 24–336)

## 2023-03-18 MED ORDER — ONDANSETRON HCL 4 MG PO TABS: 4.0000 mg | ORAL_TABLET | Freq: Four times a day (QID) | ORAL | Status: DC | PRN

## 2023-03-18 MED ORDER — ONDANSETRON HCL 4 MG/2ML IJ SOLN: 4.0000 mg | Freq: Four times a day (QID) | INTRAMUSCULAR | Status: DC | PRN

## 2023-03-18 MED ORDER — METOPROLOL TARTRATE 5 MG/5ML IV SOLN: 5.0000 mg | INTRAVENOUS | Status: DC | PRN

## 2023-03-18 MED ORDER — HEPARIN SODIUM (PORCINE) 5000 UNIT/ML IJ SOLN
5000.0000 [IU] | Freq: Three times a day (TID) | INTRAMUSCULAR | Status: DC
  Administered 2023-03-18 – 2023-04-06 (×54): 5000 [IU] via SUBCUTANEOUS
  Filled 2023-03-18 (×50): qty 1

## 2023-03-18 MED ORDER — SODIUM CHLORIDE 0.9 % IV BOLUS
500.0000 mL | Freq: Once | INTRAVENOUS | Status: AC
Start: 2023-03-18 — End: 2023-03-18
  Administered 2023-03-18: 500 mL via INTRAVENOUS

## 2023-03-18 MED ORDER — POTASSIUM CHLORIDE CRYS ER 20 MEQ PO TBCR: 40.0000 meq | EXTENDED_RELEASE_TABLET | ORAL | Status: DC

## 2023-03-18 MED ORDER — ACETAMINOPHEN 650 MG RE SUPP
650.0000 mg | Freq: Four times a day (QID) | RECTAL | Status: DC | PRN
Start: 2023-03-18 — End: 2023-04-10

## 2023-03-18 MED ORDER — ENSURE ENLIVE PO LIQD
237.0000 mL | Freq: Two times a day (BID) | ORAL | Status: DC
  Administered 2023-03-19 – 2023-03-24 (×11): 237 mL via ORAL

## 2023-03-18 MED ORDER — METOPROLOL SUCCINATE ER 25 MG PO TB24
25.0000 mg | ORAL_TABLET | Freq: Every day | ORAL | Status: DC
  Administered 2023-03-18: 25 mg via ORAL
  Filled 2023-03-18 (×2): qty 1

## 2023-03-18 MED ORDER — POTASSIUM CHLORIDE IN NACL 20-0.9 MEQ/L-% IV SOLN
INTRAVENOUS | Status: AC
  Filled 2023-03-18 (×4): qty 1000

## 2023-03-18 MED ORDER — SODIUM CHLORIDE 0.9 % IV BOLUS
1000.0000 mL | Freq: Once | INTRAVENOUS | Status: AC
Start: 2023-03-18 — End: 2023-03-18
  Administered 2023-03-18: 1000 mL via INTRAVENOUS

## 2023-03-18 MED ORDER — ACETAMINOPHEN 325 MG PO TABS
650.0000 mg | ORAL_TABLET | Freq: Four times a day (QID) | ORAL | Status: DC | PRN
  Administered 2023-04-02: 650 mg via ORAL
  Filled 2023-03-18: qty 2

## 2023-03-18 NOTE — ED Notes (Signed)
 Pt niece updated on plan of care

## 2023-03-18 NOTE — ED Provider Triage Note (Signed)
 Emergency Medicine Provider Triage Evaluation Note  Haldon Carley , a 89 y.o. male  was evaluated in triage.  Pt complains of 4 hours without taking any of his medications.  Patient has history of dysphagia.  Patient looks disoriented in time and place.  Patient is asking for water.   Review of Systems  Positive:  Negative:  Physical Exam  BP 134/69 (BP Location: Right Arm)   Pulse 64   Temp 98.4 F (36.9 C) (Oral)   Resp 18   SpO2 100%  Gen:   Awake, no distress, dehydrated.  Periorbital  edema Resp:  Normal effort , no wheezing MSK:   Moves extremities without difficulty no edema in lower extremities Other:    Medical Decision Making  Medically screening exam initiated at 3:34 PM.  Appropriate orders placed.  Norleen Debby Seeds was informed that the remainder of the evaluation will be completed by another provider, this initial triage assessment does not replace that evaluation, and the importance of remaining in the ED until their evaluation is complete.  Patient has history of anorexia, possible dehydration and kidney damage.  Patient will have labs   Janit Kast, PA-C 03/18/23 1537

## 2023-03-18 NOTE — ED Notes (Signed)
 Communication w/ lab, states unable to run labs d/t elevated protein serum level. MD notified, plan for admission.

## 2023-03-18 NOTE — H&P (Signed)
 PCP:   [facility]   Chief Complaint:  Decreased p.o. intake  HPI: This is a 89 year old gentleman with past medical history significant for HLD, HTN, CAD, BPH, H/o CVA w/ left hemiparesis.  Patient resides in independent living facility.  He was brought in with decreased p.o. liquid intake for the past 3 days.  Patient is lethargic and a poor historian.  He appears dehydrated.  In the ER patient's vital stable.  Sodium 130, potassium 3.3, creatinine 2.67 baseline normal, total protein greater than 12, hemoglobin 9.7 [baseline 11.8, done 12/09/2022], platelets 114 [baseline normal]. EKG: Atrial fibrillation HR 66, QTc 468.  Review of Systems:  Per HPI  Past Medical History: Past Medical History:  Diagnosis Date   Angina of effort (HCC)    BPH (benign prostatic hyperplasia)    Coronary artery disease    Encephalopathy    Hemiparesis (HCC)    Hyperlipidemia    Hypertension    MI (myocardial infarction) (HCC)    Stroke [facility])    Past Surgical History:  Procedure Laterality Date   CORONARY/GRAFT ACUTE MI REVASCULARIZATION N/A 06/09/2021   Procedure: Coronary/Graft Acute MI Revascularization;  Surgeon: Ammon Blunt, MD;  Location: [facility] INVASIVE CV LAB;  Service: Cardiovascular;  Laterality: N/A;   LEFT HEART CATH AND CORONARY ANGIOGRAPHY N/A 06/09/2021   Procedure: LEFT HEART CATH AND CORONARY ANGIOGRAPHY;  Surgeon: Ammon Blunt, MD;  Location: [facility] INVASIVE CV LAB;  Service: Cardiovascular;  Laterality: N/A;    Medications: Prior to Admission medications   Medication Sig Start Date End Date Taking? Authorizing Provider  atorvastatin  (LIPITOR ) 80 MG tablet Take 1 tablet (80 mg total) by mouth daily. 06/11/21 09/09/21  Awanda City, MD  ciprofloxacin  (CIPRO ) 500 MG tablet Take 1 tablet (500 mg total) by mouth 2 (two) times daily. 12/08/22   Arlander Charleston, MD  lisinopril  (ZESTRIL ) 2.5 MG tablet Take 1 tablet (2.5 mg total) by mouth daily. 06/11/21 09/09/21  Awanda City,  MD  metoprolol  succinate (TOPROL -XL) 25 MG 24 hr tablet Take 1 tablet (25 mg total) by mouth daily. Take with or immediately following a meal. 06/11/21 09/09/21  Awanda City, MD    Allergies:   Allergies  Allergen Reactions   Penicillins Other (See Comments)    Social History:  reports that he does not currently use alcohol . He reports that he does not currently use drugs. No history on file for tobacco use.  Family History: History reviewed. No pertinent family history.  Physical Exam: Vitals:   03/18/23 1800 03/18/23 1938 03/18/23 1945 03/18/23 2000  BP: (!) 160/80   (!) 159/72  Pulse: 67   61  Resp: 15   15  Temp:  98.1 F (36.7 C)    TempSrc:  Oral    SpO2: 100%  100% 90%  Weight:      Height:        General: Arousable but lethargic, cachectic dehydrated male, no acute distress Eyes: Pink conjunctiva, no scleral icterus ENT: Dry oral mucosa, neck supple, no thyromegaly Lungs: CTA B/L, no wheeze, no crackles, no use of accessory muscles Cardiovascular: Irregularly irregular, no regurgitation, no JVD Abdomen: soft, positive BS, NTND, not an acute abdomen GU: not examined Neuro: CN II - XII appears grossly intact Musculoskeletal: No edema.  Strength appears equal Skin: no rash, no subcutaneous crepitation, no decubitus Psych: Lethargic, confused patient  Labs on Admission:  Recent Labs    03/18/23 1614  NA 130*  K 3.3*  CL 98  CO2 26  GLUCOSE 98  BUN 73*  CREATININE 2.67*  CALCIUM  13.0*   Recent Labs    03/18/23 1614  AST 21  ALT 17  ALKPHOS 34*  BILITOT 0.8  PROT >12.0*  ALBUMIN 3.1*    Recent Labs    03/18/23 1614  WBC 5.6  NEUTROABS 3.8  HGB 9.7*  HCT 30.3*  MCV 95.9  PLT 114*    Radiological Exams on Admission: No results found.  Assessment/Plan Present on Admission:  Hypercalcemia -Differential diagnosis dehydration, hyperparathyroidism, malignancy.  No supplementation. -PTH, ionized calcium  ordered -Aggressive IV fluid  hydration -Repeat calcium  ordered.  If elevated, will treat with IV zoledronic acid x 1   AKI -Aggressive IV fluid hydration -BMP in a.m.   Hypokalemia //  Hyponatremia -Repleted p.o. and IV -Repeat a.m. labs   FTT -Hydrating.  PT consult placed -Ensure with meals   Anemia -Current hemoglobin 9.7, baseline 11.8 [11/2022].  Would like to decrease further with hydration -Occult stool ordered.  Anemia panel ordered -Patient with history of vitamin B12 deficiency  -Colonoscopy?   Abnormal EKG -Dr. Florencio contacted by EDP, EKG reviewed.  EKG unchanged from previous. -Lab unable to obtain troponin level because significant elevated protein. -ST segment elevation V4, no ST segment elevation in contiguous leads    Coronary disease -Stable.  Atorvastatin /lisinopril  on hold.  Metoprolol  resumed   HTN -Lisinopril  on hold with AKI -Metoprolol  resumed with hold parameters   HLD -Atorvastatin  on hold   Left hemiparesis (HCC)  //  history of CVA -Atorvastatin /lisinopril  on hold.  Metoprolol  resumed -  Markeita Alicia 03/18/2023, 9:52 PM

## 2023-03-18 NOTE — ED Triage Notes (Signed)
 Pt here via [facility] from [employer], pt here with unknown complaint. EMS states pt has not been eating or drinking as he should. Facility not able to tell EMS how long he has not taken his meds or eaten. Pt has HX of CVA.  Pt denies pain at this time.

## 2023-03-18 NOTE — ED Provider Notes (Signed)
 [facility] Provider Note    Event Date/Time   First MD Initiated Contact with Patient 03/18/23 1701     (approximate)   History   Failure To Thrive   HPI  Hunter Bailey is a 89 y.o. male who presents to the emergency department from living facility for apparent decreased oral intake.  Patient himself is not oriented as to events and does not know why he is here in the emergency department.  He did ask for water upon my arrival to his room.  He denies any pain.     Physical Exam   Triage Vital Signs: ED Triage Vitals [03/18/23 1534]  Encounter Vitals Group     BP 134/69     Systolic BP Percentile      Diastolic BP Percentile      Pulse Rate 64     Resp 18     Temp 98.4 F (36.9 C)     Temp Source Oral     SpO2 100 %     Weight 134 lb 7.7 oz (61 kg)     Height 5' 8 (1.727 m)     Head Circumference      Peak Flow      Pain Score 0     Pain Loc      Pain Education      Exclude from Growth Chart     Most recent vital signs: Vitals:   03/18/23 1534  BP: 134/69  Pulse: 64  Resp: 18  Temp: 98.4 F (36.9 C)  SpO2: 100%   General: Awake, alert, not oriented. CV:  Good peripheral perfusion. Regular rate and rhythm. Resp:  Normal effort. Lungs clear. Abd:  No distention.     ED Results / Procedures / Treatments   Labs (all labs ordered are listed, but only abnormal results are displayed) Labs Reviewed  CBC WITH DIFFERENTIAL/PLATELET - Abnormal; Notable for the following components:      Result Value   RBC 3.16 (*)    Hemoglobin 9.7 (*)    HCT 30.3 (*)    RDW 16.2 (*)    Platelets 114 (*)    All other components within normal limits  COMPREHENSIVE METABOLIC PANEL - Abnormal; Notable for the following components:   Sodium 130 (*)    Potassium 3.3 (*)    BUN 73 (*)    Creatinine, Ser 2.67 (*)    Calcium  13.0 (*)    Total Protein >12.0 (*)    Albumin 3.1 (*)    Alkaline Phosphatase 34 (*)    GFR, Estimated 23 (*)     All other components within normal limits  CBC WITH DIFFERENTIAL/PLATELET  URINALYSIS, ROUTINE W REFLEX MICROSCOPIC     EKG  I, Guadalupe Eagles, attending physician, personally viewed and interpreted this EKG  EKG Time: 1801 Rate: 66 Rhythm: sinus rhythm with short pr interval Axis: left axis deviation Intervals: qtc 450 QRS: narrow, q waves v4, v5, v6 ST changes: st elevation v4 Impression: abnormal ekg     RADIOLOGY No  PROCEDURES:  Critical Care performed: No   MEDICATIONS ORDERED IN ED: Medications  sodium chloride  0.9 % bolus 500 mL (500 mLs Intravenous New Bag/Given 03/18/23 1705)     IMPRESSION / MDM / ASSESSMENT AND PLAN / ED COURSE  I reviewed the triage vital signs and the nursing notes.  Differential diagnosis includes, but is not limited to, infection, dehydration, anemia  Patient's presentation is most consistent with acute presentation with potential threat to life or bodily function.   The patient is on the cardiac monitor to evaluate for evidence of arrhythmia and/or significant heart rate changes.  Patient presented to the emergency department today from living facility because of concerns for decreased oral intake and failure to thrive.  On exam patient is awake and alert however not oriented.  Patient is afebrile.  EKG was concerning for some abnormality.  I discussed with Dr. Florencio with cardiology who reviewed the EKG.  At this time did not think it represented STEMI.  Did order troponin however even protein interference the lab was not able to run it.  Blood work did show signs of dehydration.  Did start IV fluids.  Discussed with Dr. Laveda with the hospitalist service who will evaluate for admission.      FINAL CLINICAL IMPRESSION(S) / ED DIAGNOSES   Final diagnoses:  Dehydration      Note:  This document was prepared using Dragon voice recognition software and may include unintentional dictation  errors.    Floy Roberts, MD 03/18/23 2139

## 2023-03-19 LAB — URINALYSIS, ROUTINE W REFLEX MICROSCOPIC
Bilirubin Urine: NEGATIVE
Glucose, UA: NEGATIVE mg/dL
Ketones, ur: NEGATIVE mg/dL
Leukocytes,Ua: NEGATIVE
Nitrite: NEGATIVE
Protein, ur: 100 mg/dL — AB
Specific Gravity, Urine: 1.019 (ref 1.005–1.030)
pH: 5 (ref 5.0–8.0)

## 2023-03-19 LAB — BASIC METABOLIC PANEL
Anion gap: 5 (ref 5–15)
BUN: 57 mg/dL — ABNORMAL HIGH (ref 8–23)
CO2: 23 mmol/L (ref 22–32)
Calcium: 11.4 mg/dL — ABNORMAL HIGH (ref 8.9–10.3)
Chloride: 106 mmol/L (ref 98–111)
Creatinine, Ser: 2.15 mg/dL — ABNORMAL HIGH (ref 0.61–1.24)
GFR, Estimated: 29 mL/min — ABNORMAL LOW (ref >60)
Glucose, Bld: 90 mg/dL (ref 70–99)
Potassium: 3.5 mmol/L (ref 3.5–5.1)
Sodium: 134 mmol/L — ABNORMAL LOW (ref 135–145)

## 2023-03-19 LAB — RETICULOCYTES
Immature Retic Fract: 12.2 % (ref 2.3–15.9)
RBC.: 2.62 MIL/uL — ABNORMAL LOW (ref 4.22–5.81)
Retic Count, Absolute: 33.5 10*3/uL (ref 19.0–186.0)
Retic Ct Pct: 1.3 % (ref 0.4–3.1)

## 2023-03-19 LAB — CBC WITH DIFFERENTIAL/PLATELET
Abs Immature Granulocytes: 0.05 10*3/uL (ref 0.00–0.07)
Basophils Absolute: 0 10*3/uL (ref 0.0–0.1)
Basophils Relative: 0 %
Eosinophils Absolute: 0 10*3/uL (ref 0.0–0.5)
Eosinophils Relative: 0 %
HCT: 25.3 % — ABNORMAL LOW (ref 39.0–52.0)
Hemoglobin: 8.1 g/dL — ABNORMAL LOW (ref 13.0–17.0)
Immature Granulocytes: 1 %
Lymphocytes Relative: 20 %
Lymphs Abs: 1.1 10*3/uL (ref 0.7–4.0)
MCH: 30.9 pg (ref 26.0–34.0)
MCHC: 32 g/dL (ref 30.0–36.0)
MCV: 96.6 fL (ref 80.0–100.0)
Monocytes Absolute: 0.6 10*3/uL (ref 0.1–1.0)
Monocytes Relative: 11 %
Neutro Abs: 3.7 10*3/uL (ref 1.7–7.7)
Neutrophils Relative %: 68 %
Platelets: 89 10*3/uL — ABNORMAL LOW (ref 150–400)
RBC: 2.62 MIL/uL — ABNORMAL LOW (ref 4.22–5.81)
RDW: 16.5 % — ABNORMAL HIGH (ref 11.5–15.5)
WBC: 5.5 10*3/uL (ref 4.0–10.5)
nRBC: 0 % (ref 0.0–0.2)

## 2023-03-19 LAB — TECHNOLOGIST SMEAR REVIEW: Plt Morphology: DECREASED

## 2023-03-19 LAB — MAGNESIUM: Magnesium: 1.9 mg/dL (ref 1.7–2.4)

## 2023-03-19 NOTE — Progress Notes (Signed)
 Progress Note   Patient: Hunter Bailey FMW:[id number] DOB: 1934-04-14 DOA: 03/18/2023     1 DOS: the patient was seen and examined on 03/19/2023   Brief hospital course: 89 year old gentleman with past medical history significant for HLD, HTN, CAD, BPH, H/o CVA w/ left hemiparesis.  Patient resides in independent living facility.  He was brought in with decreased p.o. liquid intake for the past 3 days. He was lethargic on admission.   In the ER patient's vital stable.  Sodium 130, potassium 3.3, creatinine 2.67 baseline normal, total protein greater than 12, hemoglobin 9.7 [baseline 11.8, done 12/09/2022], platelets 114 [baseline normal]. Calcium  elevated to 13.  EKG: Atrial fibrillation HR 66, QTc 468.  Assessment and Plan: Hypercalcemia   Patient with normal calcium  12/09/22 and previous BMPs in 2023 with no hypercalcemia. Patient with severely elevated protein level. Also noted to have poor oral intake over the last several days.  Considered hypercalcemia of malignancy versus dehydration -PTH and ionized calcium  pending -Spep -Continue aggressive IV fluid  hydration -Repeat calcium  still pending, if calcium  remains elevated will treat w/ IV zoledronic x1   Elevated serum creatinine, likely AKI     Latest Ref Rng & Units 03/18/2023    4:14 PM 12/09/2022    1:02 PM 12/08/2022    2:34 PM  BMP  Glucose 70 - 99 mg/dL 98  876  881   BUN 8 - 23 mg/dL 73  29  27   Creatinine 0.61 - 1.24 mg/dL 7.32  8.79  8.70   Sodium 135 - 145 mmol/L 130  140  138   Potassium 3.5 - 5.1 mmol/L 3.3  3.6  3.6   Chloride 98 - 111 mmol/L 98  105  102   CO2 22 - 32 mmol/L 26  28  29    Calcium  8.9 - 10.3 mg/dL 86.9  9.4  9.6   -IV fluids per above   Hypokalemia  Replete   FTT PT and dietitian consult placed  Normocytic anemia  Thrombocytopenia     Latest Ref Rng & Units 03/19/2023   12:39 AM 03/18/2023    4:14 PM 12/09/2022    1:02 PM  CBC  WBC 4.0 - 10.5 K/uL 5.5  5.6  3.5   Hemoglobin 13.0 - 17.0  g/dL 8.1  9.7  88.1   Hematocrit 39.0 - 52.0 % 25.3  30.3  35.8   Platelets 150 - 400 K/uL 89  114  151    Hgb decreased from 3 mo ago. BL ~11. 9.7 on admission now 8.1. Partial dilutional effect. No obvious bleeding noted. Iron studies are within normal limits. Possibly related to underlying malignancy.  -f/u vitB12, folate -Transfuse hgb<7  CAD Stable, hold ACE, and atorvastatin . Continue metoprolol    HLD Atorvastatin  on hold   L hemiparesis  H/o CVA  Atorvastatin  on hold      Subjective: Patient awake and alert, but inappropriately answers questions   Physical Exam: Vitals:   03/19/23 0609 03/19/23 0609 03/19/23 0830 03/19/23 1130  BP:  (!) 153/73 (!) 164/72 (!) 162/63  Pulse:  62 (!) 44 (!) 47  Resp:  18 17 18   Temp: 98.1 F (36.7 C)     TempSrc: Oral     SpO2:  100% 98% 98%  Weight:      Height:       Physical Exam  Constitutional: In no distress. Confused  Cardiovascular: Normal rate, regular rhythm. No lower extremity edema  Pulmonary: Non labored breathing on room  air, no wheezing or rales.   Abdominal: Soft. Normal bowel sounds. Non distended and non tender  Neurological: Alert and oriented to person only. Unable to assess fully due to patient's mental status  Skin: Skin is warm and dry.   Data Reviewed:  I have reviewed labs and images    Disposition: Status is: Inpatient Remains inpatient appropriate because: requires further work up of hypercalcemia   Planned Discharge Destination:  Pending clinical course     Time spent: 35 minutes  Author: Alban Pepper, MD 03/19/2023 2:32 PM  For on call review [email or web address].

## 2023-03-19 NOTE — ED Notes (Signed)
 Attempted to feed pt at this time. Pt refused stating "I'm not hungry."

## 2023-03-19 NOTE — ED Notes (Signed)
 Pt utilizes urinal w/ assist. Repositioned for comfort and lights lowered to promote relaxation.

## 2023-03-19 NOTE — ED Notes (Signed)
 Dietary called at this time for ensure to be brought up.

## 2023-03-19 NOTE — ED Notes (Signed)
 Pt resting on stretcher, labs obtained via straight stick, Denies needing to utilize urinal. Pending inpt bed.

## 2023-03-19 NOTE — ED Notes (Addendum)
 Pt brief, linens, and chux pad changed at this time. Peri care provided. No breakdown noted on assessment of skin. Skin intact and appropriate for ethnicity.

## 2023-03-19 NOTE — ED Notes (Signed)
 Messaged provider at this time about metoprolol dose. Pt HR is bradycardic at baseline 50-60 bpm. MD made aware.

## 2023-03-19 NOTE — Progress Notes (Signed)
 PT Cancellation Note  Patient Details Name: Burney Calzadilla MRN: [id number] DOB: 1934/05/31   Cancelled Treatment:    Reason Eval/Treat Not Completed: Medical issues which prohibited therapy (Consult received and chart reviewed.  Patient noted with critically elevated calcium  levels (pending recheck).  Per discussion with attending, advises hold on initiation of eval until recheck complete and labs improved/normalized. Will continue to follow and initiate as medically appropriate.)  Avie Checo H. Delores, PT, DPT, NCS 03/19/23, 11:55 AM [phone]

## 2023-03-20 DIAGNOSIS — Z79899 Other long term (current) drug therapy: Secondary | ICD-10-CM | POA: Diagnosis not present

## 2023-03-20 DIAGNOSIS — N390 Urinary tract infection, site not specified: Secondary | ICD-10-CM | POA: Diagnosis not present

## 2023-03-20 LAB — BASIC METABOLIC PANEL
Anion gap: 1 — ABNORMAL LOW (ref 5–15)
BUN: 50 mg/dL — ABNORMAL HIGH (ref 8–23)
CO2: 20 mmol/L — ABNORMAL LOW (ref 22–32)
Calcium: 10.7 mg/dL — ABNORMAL HIGH (ref 8.9–10.3)
Chloride: 109 mmol/L (ref 98–111)
Creatinine, Ser: 2.15 mg/dL — ABNORMAL HIGH (ref 0.61–1.24)
GFR, Estimated: 29 mL/min — ABNORMAL LOW (ref >60)
Glucose, Bld: 175 mg/dL — ABNORMAL HIGH (ref 70–99)
Potassium: 4.8 mmol/L (ref 3.5–5.1)
Sodium: 132 mmol/L — ABNORMAL LOW (ref 135–145)

## 2023-03-20 LAB — VITAMIN B12: Vitamin B-12: 191 pg/mL (ref 180–914)

## 2023-03-20 LAB — MAGNESIUM: Magnesium: 1.8 mg/dL (ref 1.7–2.4)

## 2023-03-20 LAB — CALCIUM, IONIZED: Calcium, Ionized, Serum: 7.2 mg/dL — ABNORMAL HIGH (ref 4.5–5.6)

## 2023-03-20 LAB — PTH, INTACT AND CALCIUM
Calcium, Total (PTH): 11.4 mg/dL — ABNORMAL HIGH (ref 8.6–10.2)
PTH: 11 pg/mL — ABNORMAL LOW (ref 15–65)

## 2023-03-20 MED ORDER — FLUCONAZOLE IN SODIUM CHLORIDE 200-0.9 MG/100ML-% IV SOLN
200.0000 mg | INTRAVENOUS | Status: DC
  Administered 2023-03-20 – 2023-03-24 (×5): 200 mg via INTRAVENOUS
  Filled 2023-03-20 (×6): qty 100

## 2023-03-20 NOTE — Evaluation (Signed)
 Clinical/Bedside Swallow Evaluation Patient Details  Name: Hunter Bailey MRN: [id number] Date of Birth: 11/22/1934  Today's Date: 03/20/2023 Time: SLP Start Time (ACUTE ONLY): 1450 SLP Stop Time (ACUTE ONLY): 1550 SLP Time Calculation (min) (ACUTE ONLY): 60 min  Past Medical History:  Past Medical History:  Diagnosis Date   Angina of effort (HCC)    BPH (benign prostatic hyperplasia)    Coronary artery disease    Encephalopathy    Hemiparesis (HCC)    Hyperlipidemia    Hypertension    MI (myocardial infarction) (HCC)    Stroke [facility])    Past Surgical History:  Past Surgical History:  Procedure Laterality Date   CORONARY/GRAFT ACUTE MI REVASCULARIZATION N/A 06/09/2021   Procedure: Coronary/Graft Acute MI Revascularization;  Surgeon: Ammon Blunt, MD;  Location: [facility] INVASIVE CV LAB;  Service: Cardiovascular;  Laterality: N/A;   LEFT HEART CATH AND CORONARY ANGIOGRAPHY N/A 06/09/2021   Procedure: LEFT HEART CATH AND CORONARY ANGIOGRAPHY;  Surgeon: Ammon Blunt, MD;  Location: [facility] INVASIVE CV LAB;  Service: Cardiovascular;  Laterality: N/A;   HPI:  Hunter Bailey is an 88yoM who comes to [facility] on 03/18/23 after 3 days of decreased PO intake, Hunter Bailey now lethargic and appears dehydrated. Hunter Bailey with elevated Ca++ 13.  Hunter Bailey resides at the [place] of [address] PMH: CVA w/ Left hemiplegia.   CXR: No active disease.  Of Note: Hunter Bailey indicated he had been being tx'd for UTI problems at the NH in the past month.    Assessment / Plan / Recommendation  Clinical Impression   Hunter Bailey seen for BSE this PM. Hunter Bailey awake, resting in bed. Verbally responded to questions re: self w/ fair-good accuracy; mild+ Dysarthria present. Hunter Bailey has h/o Old CVA w/ Left sided weakness. Hunter Bailey was unclear of his baseline diet at the NH but stated he got by.  On RA, afebrile. WBC wnl.  Hunter Bailey appears to present w/ concern for primary pharyngeal phase dysphagia w/ overt s/s of pharyngeal phase dysphagia noted; concern for neuromuscular deficits.  Hunter Bailey consumed po trials w/ delayed, overt clinical s/s of aspiration following po trials fairly consistently.  Hunter Bailey appears at risk for aspiration/aspiration pneumonia w/ current presentation despite use of aspiration precautions. He has challenging factors that could impact oropharyngeal swallowing to include Chronic R CVA w/ L sides weakness, including oral weakness; poor/Missing Dentition; need for support/assistance w/ setup and feeding at meals d/t LUE weakness; advanced age; acute/chronic illness. These factors can increase risk for aspiration, dysphagia as well as decreased oral intake overall.  During po trials, Hunter Bailey consumed consistencies given w/ overt throat clearing and/or coughing post trials; this continued w/ each consistency. No decline in vocal quality nor change in respiratory presentation occurred. O2 sats remained in the upper 90s. Oral phase appeared grossly [facility] w/ timely bolus management and control of bolus propulsion for timely A-P transfer for swallowing. Noted mild+ decreased Left labial seal on cup when drinking; no gross Left anterior leakage. Oral clearing achieved w/ all trial consistencies. OM Exam appeared [facility] w/ no unilateral lingual weakness noted; min+ Left labial weakness/ROM noted. Speech mildly Dysarthric. Hunter Bailey fed self w/ setup support.   Recommend NPO status pending MBSS to objectively assess swallow function and safety or oral intake/diet. Recommend frequent oral care for hygiene and stimulation of swallowing; aspiration precautions.  Pills via alternative means. Recommend MD to assess potential Thrush presence and need to tx.  Education given to Hunter Bailey on the above. MBSS order placed for tomorrow, 03/21/2023. MD/NSG updated, agreed. 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  Recommendations may be updated based on patient status, additional functional criteria and insurance authorization.  Follow up Recommendations Follow physician's recommendations for discharge plan and follow up therapies      Assistance Recommended at Discharge  FULL  Functional Status Assessment Patient has had a recent decline in their functional status and demonstrates the ability to make significant improvements in function in a reasonable and predictable amount of time.  Frequency and Duration min 2x/week  2 weeks       Prognosis Prognosis for improved oropharyngeal function: Fair Barriers to Reach Goals: Time post onset;Severity of deficits Barriers/Prognosis Comment: old R CVA; missing/poor Dentition; LUE weakness      Swallow Study   General Date of Onset: 03/18/23 HPI: Hunter Bailey is an 88yoM who [facility] to [facility] on [facility]18[facility]23 after 3 days of decreased PO intake, Hunter Bailey now lethargic and appears dehydrated. Hunter Bailey with elevated Ca++ 13.  Hunter Bailey resides at the [place] [address] PMH: CVA w/ Left hemiplegia.   CXR: No active disease.  Of Note: Hunter Bailey indicated he had been being tx'd for UTI problems at the NH in the past month. Type of Study: Bedside Swallow Evaluation Previous Swallow Assessment: none listed Diet Prior to this Study: Thin liquids (Level 0) (clear liquid per MD) Temperature Spikes Noted: No (wbc 5.5) Respiratory Status: Room air History of Recent Intubation: No Behavior/Cognition: Alert;Cooperative;Pleasant mood;Requires cueing (intermittent) Oral Cavity Assessment: Dry (coated tongue appearance) Oral Care Completed by SLP: Yes Oral Cavity -  Dentition: Poor condition;Missing dentition (no upper) Vision: Functional for self-feeding Self-Feeding Abilities: Able to feed self;Needs assist;Needs set up (held Cup to drink w/ RUE) Patient Positioning: Upright in bed (needed positioning assistance) Baseline Vocal Quality: Normal Volitional Cough: Strong Volitional Swallow: Able to elicit    Oral/Motor/Sensory Function Overall Oral Motor/Sensory Function: Mild impairment (chronic) Facial ROM: Reduced left Facial Symmetry: Abnormal symmetry left Facial Strength: Reduced left Lingual ROM: Within Functional Limits Lingual Symmetry: Within Functional Limits Lingual Strength: Within Functional Limits Velum: Within Functional Limits Mandible: Within Functional Limits   Ice Chips Ice chips: Within functional limits Presentation: Spoon (5 trials)   Thin Liquid Thin Liquid: Impaired Presentation: Cup;Self Fed (6 trials) Oral Phase Impairments:  (wfl) Pharyngeal  Phase Impairments: Suspected delayed Swallow;Throat Clearing - Delayed;Multiple swallows (audible swallows) Other Comments: x2    Nectar Thick Nectar Thick Liquid: Impaired Presentation: Cup;Self Fed (5 trials) Oral Phase Impairments:  (wfl) Pharyngeal Phase Impairments: Suspected delayed Swallow;Multiple swallows;Cough - Delayed (audible swallows) Other Comments: x3   Honey Thick Honey Thick Liquid: Not tested   Puree Puree: Impaired Presentation: Spoon (fed; 5 trials) Oral Phase Impairments:  (wfl) Pharyngeal Phase Impairments: Suspected delayed Swallow;Multiple swallows;Throat Clearing - Delayed;Cough - Delayed (x3)   Solid     Solid: Not tested        Comer Portugal, MS, CCC-SLP Speech Language Pathologist Rehab Services; [facility] [phone] (ascom) Adeleine Pask 03/20/2023,4:51 PM

## 2023-03-20 NOTE — Evaluation (Signed)
 Physical Therapy Evaluation Patient Details Name: Hunter Bailey MRN: [id number] DOB: 1934/04/26 Today's Date: 03/20/2023  History of Present Illness  Hunter Bailey is an 45yoM who comes to [facility] on 03/18/23 after 3 days of decreased PO intake, pt now lethargic and appears dehydrated. Pt with elevated Ca++: 13. Pt resides at the [place] of [address] PMH: CVA Bailey Lt hemiplegia.  Clinical Impression  Pt awake on entry, asking to go to the bathroom, but most other attempted to communicate with patient are unsuccessful- clearly [facility], but beyond that difficulty to obtain consistently appropriate responses. Pt requires modA to EOB, modA to stand, MaxA to transfer from 1 place to another which is performed 3x in session: bed to recliner, recliner to toilet, toilet to recliner. Pt is unable to take any steps without legs giving way. Pt left in recliner at EOS with RN at bedside. Will continue to follow.       If plan is discharge home, recommend the following: Two people to help with walking and/or transfers;Direct supervision/assist for medications management;Direct supervision/assist for financial management;Assist for transportation;Assistance with cooking/housework   Can travel by private vehicle   No    Equipment Recommendations None recommended by PT  Recommendations for Other Services       Functional Status Assessment Patient has had a recent decline in their functional status and demonstrates the ability to make significant improvements in function in a reasonable and predictable amount of time.     Precautions / Restrictions Precautions Precautions: Fall Restrictions Weight Bearing Restrictions Per Provider Order: No      Mobility  Bed Mobility Overal bed mobility: Needs Assistance Bed Mobility: Supine to Sit     Supine to sit: Min assist          Transfers Overall transfer level: Needs assistance Equipment used: 1 person hand held assist Transfers: Sit to/from Stand, Bed to  chair/wheelchair/BSC Sit to Stand: Mod assist   Step pivot transfers: Max assist       General transfer comment: unable to successful achieve steps during 3 attempts    Ambulation/Gait Ambulation/Gait assistance:  (unable)                Stairs            Wheelchair Mobility     Tilt Bed    Modified Rankin (Stroke Patients Only)       Balance                                             Pertinent Vitals/Pain Pain Assessment Pain Assessment: No/denies pain    Home Living Family/patient expects to be discharged to:: Assisted living                 Home Equipment: [place] - single point Additional Comments: was using [facility] for mobility with PT at facility in 2023, unclear more recent baseline    Prior Function Prior Level of Function : Needs assist                     Extremity/Trunk Assessment                Communication      Cognition     Overall Cognitive Status: No family/caregiver present to determine baseline cognitive functioning  General Comments      Exercises     Assessment/Plan    PT Assessment Patient needs continued PT services  PT Problem List Decreased balance;Decreased mobility       PT Treatment Interventions DME instruction;Gait training;Patient/family education;Functional mobility training;Therapeutic activities;Therapeutic exercise;Balance training    PT Goals (Current goals can be found in the Care Plan section)  Acute Rehab PT Goals PT Goal Formulation: Patient unable to participate in goal setting    Frequency Min 1X/week     Co-evaluation               AM-PAC PT 6 Clicks Mobility  Outcome Measure Help needed turning from your back to your side while in a flat bed without using bedrails?: A Lot Help needed moving from lying on your back to sitting on the side of a flat bed without using bedrails?: A  Lot Help needed moving to and from a bed to a chair (including a wheelchair)?: Total Help needed standing up from a chair using your arms (e.g., wheelchair or bedside chair)?: A Lot Help needed to walk in hospital room?: Total Help needed climbing 3-5 steps with a railing? : Total 6 Click Score: 9    End of Session Equipment Utilized During Treatment: Gait belt Activity Tolerance: Patient limited by fatigue;No increased pain;Patient tolerated treatment well Patient left: in chair;with nursing/sitter in room;with call bell/phone within reach Nurse Communication: Mobility status PT Visit Diagnosis: Other abnormalities of gait and mobility (R26.89);Unsteadiness on feet (R26.81);Muscle weakness (generalized) (M62.81)    Time: 9061-8997 PT Time Calculation (min) (ACUTE ONLY): 24 min   Charges:   PT Evaluation $PT Eval Moderate Complexity: 1 Mod PT Treatments $Therapeutic Activity: 8-22 mins PT General Charges $$ ACUTE PT VISIT: 1 Visit        11:36 AM, 03/20/23 Hunter Bailey, PT, DPT Physical Therapist - [facility]  [phone] (ASCOM)    Hunter Bailey 03/20/2023, 11:33 AM

## 2023-03-20 NOTE — Evaluation (Signed)
 Occupational Therapy Evaluation Patient Details Name: Hunter Bailey MRN: [id number] DOB: 12-22-1934 Today's Date: 03/20/2023   History of Present Illness Hunter Bailey is an 58yoM who comes to [facility] on 03/18/23 after 3 days of decreased PO intake, pt now lethargic and appears dehydrated. Pt with elevated Ca++: 13. Pt resides at the [place] of [address] PMH: CVA c Lt hemiplegia.   Clinical Impression   Pt was seen for OT evaluation this date. Prior to hospital admission, pt was residing at the [place] of [address] where his niece reports he had progressed to walking with a SPC and was able to transfer in/out of a car. Will gather further info upon return call from niece.  Pt presents to acute OT demonstrating impaired ADL performance and functional mobility 2/2 weakness, L sided deficits from previous CVA, pain, and balance deficits (See OT problem list for additional functional deficits). Pt currently requires Min A to CGA for all bed mobility. Reports min/mod R shoulder pain following session. He tolerated sitting EOB to self feeding ~15 minutes with cueing needed for pacing as he began coughing after gulping his tea. Nurse notified and speech consult has been entered. Pt requiring Max A/total for LB dressing to don shoes. BLE blocking required and Mod A to perform lateral scoot with ability to clear buttocks from the bed x3. Pt fatigued and wishing to return to supine. Pt was hard to understand, but followed 1 step commands with increased time. Unable to answer orientation questions.  Pt would benefit from skilled OT services to address noted impairments and functional limitations (see below for any additional details) in order to maximize safety and independence while minimizing falls risk and caregiver burden. Do anticipate the need for follow up OT services upon acute hospital DC.        If plan is discharge home, recommend the following: A lot of help with walking and/or transfers;A lot of help with  bathing/dressing/bathroom;Direct supervision/assist for financial management;Supervision due to cognitive status;Assistance with cooking/housework;Assist for transportation;Help with stairs or ramp for entrance;Direct supervision/assist for medications management;Two people to help with walking and/or transfers    Functional Status Assessment  Patient has had a recent decline in their functional status and demonstrates the ability to make significant improvements in function in a reasonable and predictable amount of time.  Equipment Recommendations  Other (comment) (defer to next venue)    Recommendations for Other Services       Precautions / Restrictions Precautions Precautions: Fall Restrictions Weight Bearing Restrictions Per Provider Order: No      Mobility Bed Mobility Overal bed mobility: Needs Assistance Bed Mobility: Supine to Sit, Sit to Supine     Supine to sit: Min assist, Used rails, HOB elevated Sit to supine: Used rails, HOB elevated, Contact guard assist, Min assist   General bed mobility comments: Min A/CGA for bed mobility to initiate    Transfers Overall transfer level: Needs assistance Equipment used: 1 person hand held assist Transfers: Sit to/from Stand Sit to Stand: Mod assist           General transfer comment: did not fully stand, but able to raise buttocks off bed with bil feet blocking and Mod A to scoot to [facility] via lateral scoots      Balance Overall balance assessment: Needs assistance Sitting-balance support: Feet supported Sitting balance-Leahy Scale: Good Sitting balance - Comments: steady feeding self EOB  ADL either performed or assessed with clinical judgement   ADL Overall ADL's : Needs assistance/impaired Eating/Feeding: Supervision/ safety;Sitting Eating/Feeding Details (indicate cue type and reason): able to feed self jello using spoon seated at EOB and drink from a cup with  cueing for pacing; noted to cough                 Lower Body Dressing: Maximal assistance;Sitting/lateral leans                       Vision         Perception         Praxis         Pertinent Vitals/Pain Pain Assessment Pain Assessment: Faces Faces Pain Scale: Hurts little more Pain Location: R shoulder Pain Descriptors / Indicators: Aching, Sore Pain Intervention(s): Monitored during session, Repositioned     Extremity/Trunk Assessment Upper Extremity Assessment Upper Extremity Assessment: Generalized weakness;Right hand dominant;LUE deficits/detail LUE Deficits / Details: CVA hemi-weakness; fisted grip with no functional use of L hand   Lower Extremity Assessment Lower Extremity Assessment: Generalized weakness       Communication Communication Communication: Hearing impairment (HOH) Cueing Techniques: Verbal cues   Cognition Arousal: Alert Behavior During Therapy: WFL for tasks assessed/performed Overall Cognitive Status: No family/caregiver present to determine baseline cognitive functioning                                       General Comments       Exercises Other Exercises Other Exercises: Edu in role of OT in acute setting and importance of therapy to maximize his strength/safety.   Shoulder Instructions      Home Living Family/patient expects to be discharged to:: Assisted living                             Home Equipment: [place] - single point;Wheelchair - manual   Additional Comments: was using SPC for mobility with PT at facility in 2023, unclear more recent baseline; pt reports hemi-walker and W/C based on what OT could understand      Prior Functioning/Environment Prior Level of Function : Needs assist       Physical Assist : ADLs (physical)     Mobility Comments: per niece, he had progressed to walking with a cane and performing car transfers          OT Problem List: Decreased  strength;Impaired tone;Decreased range of motion;Decreased cognition;Decreased activity tolerance;Impaired balance (sitting and/or standing);Decreased safety awareness      OT Treatment/Interventions: Self-care/ADL training;Therapeutic exercise;Balance training;Therapeutic activities;Patient/family education    OT Goals(Current goals can be found in the care plan section) Acute Rehab OT Goals Patient Stated Goal: improve strength OT Goal Formulation: With patient Time For Goal Achievement: 04/03/23 Potential to Achieve Goals: Good ADL Goals Pt Will Perform Lower Body Bathing: with min assist;sit to/from stand;sitting/lateral leans;with adaptive equipment Pt Will Perform Lower Body Dressing: with min assist;with adaptive equipment;sitting/lateral leans;sit to/from stand Pt Will Transfer to Toilet: with contact guard assist;with min assist;ambulating;grab bars;regular height toilet Pt Will Perform Toileting - Clothing Manipulation and hygiene: with supervision;with contact guard assist;sit to/from stand;sitting/lateral leans Additional ADL Goal #1: Pt will demo bed mobility with SUP and good safety to return to PLOF.  OT Frequency: Min 1X/week    Co-evaluation  AM-PAC OT 6 Clicks Daily Activity     Outcome Measure Help from another person eating meals?: None Help from another person taking care of personal grooming?: A Little Help from another person toileting, which includes using toliet, bedpan, or urinal?: A Lot Help from another person bathing (including washing, rinsing, drying)?: A Lot Help from another person to put on and taking off regular upper body clothing?: A Lot Help from another person to put on and taking off regular lower body clothing?: A Lot 6 Click Score: 15   End of Session    Activity Tolerance: Patient tolerated treatment well Patient left: in bed;with call bell/phone within reach;with bed alarm set  OT Visit Diagnosis: Other abnormalities of  gait and mobility (R26.89);Unsteadiness on feet (R26.81);Muscle weakness (generalized) (M62.81);Hemiplegia and hemiparesis Hemiplegia - Right/Left: Left Hemiplegia - caused by: Cerebral infarction                Time: 1315-1335 OT Time Calculation (min): 20 min Charges:  OT General Charges $OT Visit: 1 Visit OT Evaluation $OT Eval Low Complexity: 1 Low Dajana Gehrig, OTR/L  03/20/23, 2:16 PM  Vaishali Baise E Camia Dipinto 03/20/2023, 2:13 PM

## 2023-03-20 NOTE — Progress Notes (Signed)
 Progress Note   Patient: Hunter Bailey FMW:[id number] DOB: 09-Jun-1934 DOA: 03/18/2023     2 DOS: the patient was seen and examined on 03/20/2023   Brief hospital course: 89 year old gentleman with past medical history significant for HLD, HTN, CAD, BPH, H/o CVA w/ left hemiparesis.  Patient resides in independent living facility.  He was brought in with decreased p.o. liquid intake for the past 3 days. He was lethargic on admission.    In the ER patient's vital stable.  Sodium 130, potassium 3.3, creatinine 2.67 baseline normal, total protein greater than 12, hemoglobin 9.7 [baseline 11.8, done 12/09/2022], platelets 114 [baseline normal]. Calcium  elevated to 13.  EKG: Atrial fibrillation HR 66, QTc 468.   Assessment and Plan:  Hypercalcemia  Patient with normal calcium  12/09/22 and previous BMPs in 2023 with no hypercalcemia. Considered hypercalcemia of malignancy versus dehydration -Follow-up PTH and ionized calcium  pending Continue rehydration Calcium -improved with hydration   Elevated serum creatinine, likely AKI  Hypokalemia  Replete    FTT PT and dietitian consult placed   Normocytic anemia  Thrombocytopenia  Monitor CBC closely and transfuse as indicated   CAD Stable, hold ACE, and atorvastatin .  Continue metoprolol     Hyperlipidemia Atorvastatin  on hold     History of CVA with left-sided hemiparesis Atorvastatin  on hold        Subjective: Patient awake and alert, but inappropriately answers questions    Physical Exam: Constitutional: Elderly male laying in bed, confusion improving Cardiovascular: Normal rate, regular rhythm. No lower extremity edema  Pulmonary: Non labored breathing on room air, no wheezing or rales.   Abdominal: Soft. Normal bowel sounds. Non distended and non tender  Neurological: Alert and oriented to person only. Unable to assess fully due to patient's mental status  Skin: Skin is warm and dry.     Dispo: PT OT has recommended  SNF  Data Reviewed:      Latest Ref Rng & Units 03/20/2023    6:30 AM 03/19/2023    5:33 PM 03/19/2023   12:39 AM  BMP  Glucose 70 - 99 mg/dL 824  90    BUN 8 - 23 mg/dL 50  57    Creatinine 9.38 - 1.24 mg/dL 7.84  7.84    Sodium 864 - 145 mmol/L 132  134    Potassium 3.5 - 5.1 mmol/L 4.8  3.5    Chloride 98 - 111 mmol/L 109  106    CO2 22 - 32 mmol/L 20  23    Calcium  8.9 - 10.3 mg/dL 89.2  88.5  88.5        Latest Ref Rng & Units 03/19/2023   12:39 AM 03/18/2023    4:14 PM 12/09/2022    1:02 PM  CBC  WBC 4.0 - 10.5 K/uL 5.5  5.6  3.5   Hemoglobin 13.0 - 17.0 g/dL 8.1  9.7  88.1   Hematocrit 39.0 - 52.0 % 25.3  30.3  35.8   Platelets 150 - 400 K/uL 89  114  151      Vitals:   03/20/23 0230 03/20/23 0630 03/20/23 0644 03/20/23 0810  BP: (!) 155/82 (!) 169/74  (!) 167/77  Pulse: (!) 49 (!) 50  61  Resp: 16 16  18   Temp: 97.8 F (36.6 C)  97.9 F (36.6 C) 97.6 F (36.4 C)  TempSrc: Axillary  Oral Oral  SpO2: 94% 95%  97%  Weight:      Height:  Author: Drue ONEIDA Potter, MD 03/20/2023 4:35 PM  For on call review [email or web address].

## 2023-03-21 ENCOUNTER — Inpatient Hospital Stay: Payer: 59

## 2023-03-21 LAB — BASIC METABOLIC PANEL
Anion gap: 3 — ABNORMAL LOW (ref 5–15)
BUN: 40 mg/dL — ABNORMAL HIGH (ref 8–23)
CO2: 23 mmol/L (ref 22–32)
Calcium: 10.5 mg/dL — ABNORMAL HIGH (ref 8.9–10.3)
Chloride: 108 mmol/L (ref 98–111)
Creatinine, Ser: 2.06 mg/dL — ABNORMAL HIGH (ref 0.61–1.24)
GFR, Estimated: 31 mL/min — ABNORMAL LOW (ref >60)
Glucose, Bld: 90 mg/dL (ref 70–99)
Potassium: 3.4 mmol/L — ABNORMAL LOW (ref 3.5–5.1)
Sodium: 134 mmol/L — ABNORMAL LOW (ref 135–145)

## 2023-03-21 LAB — CBC WITH DIFFERENTIAL/PLATELET
Abs Immature Granulocytes: 0.06 10*3/uL (ref 0.00–0.07)
Basophils Absolute: 0 10*3/uL (ref 0.0–0.1)
Basophils Relative: 0 %
Eosinophils Absolute: 0 10*3/uL (ref 0.0–0.5)
Eosinophils Relative: 0 %
HCT: 23.5 % — ABNORMAL LOW (ref 39.0–52.0)
Hemoglobin: 7.6 g/dL — ABNORMAL LOW (ref 13.0–17.0)
Immature Granulocytes: 1 %
Lymphocytes Relative: 21 %
Lymphs Abs: 1 10*3/uL (ref 0.7–4.0)
MCH: 31 pg (ref 26.0–34.0)
MCHC: 32.3 g/dL (ref 30.0–36.0)
MCV: 95.9 fL (ref 80.0–100.0)
Monocytes Absolute: 0.6 10*3/uL (ref 0.1–1.0)
Monocytes Relative: 12 %
Neutro Abs: 3.1 10*3/uL (ref 1.7–7.7)
Neutrophils Relative %: 66 %
Platelets: 78 10*3/uL — ABNORMAL LOW (ref 150–400)
RBC: 2.45 MIL/uL — ABNORMAL LOW (ref 4.22–5.81)
RDW: 16.5 % — ABNORMAL HIGH (ref 11.5–15.5)
WBC: 4.7 10*3/uL (ref 4.0–10.5)
nRBC: 0.4 % — ABNORMAL HIGH (ref 0.0–0.2)

## 2023-03-21 LAB — KAPPA/LAMBDA LIGHT CHAINS
Kappa free light chain: 3713.9 mg/L — ABNORMAL HIGH (ref 3.3–19.4)
Kappa, lambda light chain ratio: 357.11 — ABNORMAL HIGH (ref 0.26–1.65)
Lambda free light chains: 10.4 mg/L (ref 5.7–26.3)

## 2023-03-21 MED ORDER — POTASSIUM CHLORIDE 20 MEQ PO PACK
40.0000 meq | PACK | Freq: Once | ORAL | Status: AC
  Administered 2023-03-21: 40 meq via ORAL
  Filled 2023-03-21: qty 2

## 2023-03-21 NOTE — Progress Notes (Addendum)
 Mobility Specialist - Progress Note   03/21/23 1000  Mobility  Activity Ambulated with assistance in room;Transferred from bed to chair  Level of Assistance Minimal assist, patient does 75% or more  Assistive Device Other (Comment)  Distance Ambulated (ft) 3 ft  Activity Response Tolerated well  Mobility visit 1 Mobility      Pt lying in bed upon arrival, utilizing RA. Pt agreeable to session, but does require redirection as pt is fixated on having a drink of water despite being reminded of current NPO status. Oral care performed to assist with dry mouth. ModA to achieve EOB, STS, and min-modA +2 to pivot step to recliner. Post lean in standing. Pt does require foot blocking to prevent sliding and sequencing steps. Pt left in chair with alarm set, needs in reach.    Lennette Seip Mobility Specialist 03/21/23, 11:37 AM

## 2023-03-21 NOTE — TOC Initial Note (Signed)
 Transition of Care [facility]) - Initial/Assessment Note    Patient Details  Name: Hunter Bailey MRN: [id number] Date of Birth: 1934/12/29  [facility] CM/SW Contact:    Corean ONEIDA Haddock, RN Phone Number: 03/21/2023, 10:48 AM  Clinical Narrative:                  Patient admitted from the [facility] ALF Per OT Niece Stevens is ok with SNF as long as its covered  Therapy recommending SNF Existing PASRR Fl2 sent for signature Bed search initiated        Patient Goals and CMS Choice            Expected Discharge Plan and Services                                              Prior Living Arrangements/Services                       Activities of Daily Living      Permission Sought/Granted                  Emotional Assessment              Admission diagnosis:  Hypercalcemia [E83.52] Dehydration [E86.0] Patient Active Problem List   Diagnosis Date Noted   Hypercalcemia 03/18/2023   AKI (acute kidney injury) (HCC) 03/18/2023   Hypokalemia 03/18/2023   Hyponatremia 03/18/2023   Anemia associated with chemotherapy 03/18/2023   Acute ST elevation myocardial infarction (STEMI) involving left anterior descending (LAD) coronary artery (HCC) 06/09/2021   Chronic kidney disease, stage 3a (HCC) 06/09/2021   BPH (benign prostatic hyperplasia) 06/09/2021   Coronary disease 06/09/2021   Gastro-esophageal reflux disease without esophagitis 06/09/2021   Other vitamin B12 deficiency anemias 06/09/2021   Peripheral vascular disease (HCC) 06/09/2021   Type 2 diabetes mellitus without complications (HCC) 06/09/2021   Left hemiparesis (HCC) 01/08/2021   PCP:  [facility] Pharmacy:  No Pharmacies Listed    Social Drivers of Health (SDOH) Social History: SDOH Screenings   Tobacco Use: Medium Risk (11/30/2022)   Received from [facility]   SDOH Interventions:     Readmission Risk Interventions     No  data to display

## 2023-03-21 NOTE — Procedures (Signed)
 Modified Barium Swallow Study  Patient Details  Name: Hunter Bailey MRN: [id number] Date of Birth: August 13, 1934  Today's Date: 03/21/2023  Modified Barium Swallow completed.  Full report located under Chart Review in the Imaging Section.  History of Present Illness Pt is an 65yoM who comes to [facility] on 03/18/23 after 3 days of decreased PO intake, pt now lethargic and appears dehydrated. Pt with elevated Ca++ 13.  Pt resides at the [place] of [address] PMH: CVA w/ Left hemiplegia.   CXR: No active disease.  Of Note: Pt indicated he had been being tx'd for UTI problems at the NH in the past month.   Clinical Impression Pt presents with moderate oropharyngeal dysphagia that places him at a high risk of aspiration when consuming thin liquids and nectar thick liquids via cup sips.        Pt's oral phase is c/b repetitive tongue movements during bolus propulsion and overall decreased mastication observed.        Pt's pharyngeal phase is c/b severely delayed swallow initiation with boluses resting in the pyriform sinuses for > 5 seconds. When consuming cup sips of thin liquids and nectar thick liquids, boluses spill into airway d/t larger bolus size and inability to contain them within his pyriform sinuses. Aspiration is reduced when consuming thin liquids and nectar thick liquids via spoon.        Given the above, recommend dysphagia 1 diet with thin liquids via spoon, medicine crushed in applesauce with strict adherence to supervision and aspiration precautions.   Factors that may increase risk of adverse event in presence of aspiration Hunter Bailey & Hunter Bailey 2021): Reduced cognitive function;Limited mobility;Dependence for feeding and/or oral hygiene  Swallow Evaluation Recommendations Recommendations: PO diet PO Diet Recommendation: Dysphagia 1 (Pureed);Thin liquids (Level 0) Liquid Administration via: Spoon Medication Administration: Crushed with puree Supervision: Staff to assist with  self-feeding;Full supervision/cueing for swallowing strategies Swallowing strategies  : Minimize environmental distractions;Slow rate;Small bites/sips Postural changes: Position pt fully upright for meals;Stay upright 30-60 min after meals Oral care recommendations: Oral care BID (2x/day)    Hunter Bailey, M.S., CCC-SLP, Tree Surgeon Certified Brain Injury Specialist [facility]  [facility] Office [phone] Ascom [phone] Fax [phone]

## 2023-03-21 NOTE — [place] FL2 (Signed)
 [place]  MEDICAID FL2 LEVEL OF CARE FORM     IDENTIFICATION  Patient Name: Hunter Bailey Birthdate: 01/30/1935 Sex: male Admission Date (Current Location): 03/18/2023  [facility] and [place] Number:  Chiropodist and Address:         Provider Number: [id number]  Attending Physician Name and Address:  Dorinda Drue DASEN, MD  Relative Name and Phone Number:       Current Level of Care: Hospital Recommended Level of Care: Skilled Nursing Facility Prior Approval Number:    Date Approved/Denied:   PASRR Number:    Discharge Plan: SNF    Current Diagnoses: Patient Active Problem List   Diagnosis Date Noted   Hypercalcemia 03/18/2023   AKI (acute kidney injury) (HCC) 03/18/2023   Hypokalemia 03/18/2023   Hyponatremia 03/18/2023   Anemia associated with chemotherapy 03/18/2023   Acute ST elevation myocardial infarction (STEMI) involving left anterior descending (LAD) coronary artery (HCC) 06/09/2021   Chronic kidney disease, stage 3a (HCC) 06/09/2021   BPH (benign prostatic hyperplasia) 06/09/2021   Coronary disease 06/09/2021   Gastro-esophageal reflux disease without esophagitis 06/09/2021   Other vitamin B12 deficiency anemias 06/09/2021   Peripheral vascular disease (HCC) 06/09/2021   Type 2 diabetes mellitus without complications (HCC) 06/09/2021   Left hemiparesis (HCC) 01/08/2021    Orientation RESPIRATION BLADDER Height & Weight     Self, Place  Normal Continent Weight: 61 kg Height:  5' 8 (172.7 cm)  BEHAVIORAL SYMPTOMS/MOOD NEUROLOGICAL BOWEL NUTRITION STATUS      Continent Diet (NPO.  will advance prior to discharge)  AMBULATORY STATUS COMMUNICATION OF NEEDS Skin   Extensive Assist   Normal                       Personal Care Assistance Level of Assistance              Functional Limitations Info             SPECIAL CARE FACTORS FREQUENCY  OT (By licensed OT), PT (By licensed PT)                    Contractures  Contractures Info: Not present    Additional Factors Info  Code Status, Allergies Code Status Info: full Allergies Info: Penicillins           Current Medications (03/21/2023):  This is the current hospital active medication list Current Facility-Administered Medications  Medication Dose Route Frequency Provider Last Rate Last Admin   acetaminophen  (TYLENOL ) tablet 650 mg  650 mg Oral Q6H PRN Crosley, Debby, MD       Or   acetaminophen  (TYLENOL ) suppository 650 mg  650 mg Rectal Q6H PRN Crosley, Debby, MD       feeding supplement (ENSURE ENLIVE / ENSURE PLUS) liquid 237 mL  237 mL Oral BID BM Crosley, Debby, MD   237 mL at 03/20/23 1423   fluconazole  (DIFLUCAN ) IVPB 200 mg  200 mg Intravenous Q24H Dorinda Drue T, MD 100 mL/hr at 03/20/23 2037 200 mg at 03/20/23 2037   heparin  injection 5,000 Units  5,000 Units Subcutaneous Q8H Crosley, Debby, MD   5,000 Units at 03/21/23 0537   metoprolol  tartrate (LOPRESSOR ) injection 5 mg  5 mg Intravenous Q4H PRN Crosley, Debby, MD       ondansetron  (ZOFRAN ) tablet 4 mg  4 mg Oral Q6H PRN Crosley, Debby, MD       Or   ondansetron  (ZOFRAN ) injection 4 mg  4 mg Intravenous Q6H PRN Crosley, Debby, MD       potassium chloride  (KLOR-CON ) packet 40 mEq  40 mEq Oral Once Dorinda Drue DASEN, MD       Facility-Administered Medications Ordered in Other Encounters  Medication Dose Route Frequency Provider Last Rate Last Admin   clopidogrel  (PLAVIX ) tablet    PRN Paraschos, Alexander, MD   600 mg at 06/09/21 2049     Discharge Medications: Please see discharge summary for a list of discharge medications.  Relevant Imaging Results:  Relevant Lab Results:   Additional Information ss [id number]  Corean DASEN Haddock, RN

## 2023-03-21 NOTE — Plan of Care (Signed)
  Problem: Clinical Measurements: Goal: Will remain free from infection Outcome: Progressing   Problem: Clinical Measurements: Goal: Diagnostic test results will improve Outcome: Progressing   Problem: Clinical Measurements: Goal: Respiratory complications will improve Outcome: Progressing   Problem: Clinical Measurements: Goal: Respiratory complications will improve Outcome: Progressing   Problem: Clinical Measurements: Goal: Cardiovascular complication will be avoided Outcome: Progressing

## 2023-03-21 NOTE — Plan of Care (Signed)

## 2023-03-21 NOTE — Progress Notes (Signed)
 Progress Note   Patient: Hunter Bailey FMW:[id number] DOB: 15-Jun-1934 DOA: 03/18/2023     3 DOS: the patient was seen and examined on 03/21/2023    Brief hospital course: 89 year old gentleman with past medical history significant for HLD, HTN, CAD, BPH, H/o CVA w/ left hemiparesis.  Patient resides in independent living facility.  He was brought in with decreased p.o. liquid intake for the past 3 days. He was lethargic on admission.    In the ER patient's vital stable.  Sodium 130, potassium 3.3, creatinine 2.67 baseline normal, total protein greater than 12, hemoglobin 9.7 [baseline 11.8, done 12/09/2022], platelets 114 [baseline normal]. Calcium  elevated to 13.  EKG: Atrial fibrillation HR 66, QTc 468.   Assessment and Plan:   Hypercalcemia  Patient with normal calcium  12/09/22 and previous BMPs in 2023 with no hypercalcemia. Considered hypercalcemia of malignancy versus dehydration PTH level low 11 and ionized calcium  7.2 slightly high Continue rehydration Calcium -improved with hydration   Elevated serum creatinine, likely AKI  Hypokalemia  Continue repletion and monitoring   FTT PT and dietitian consult placed   Normocytic anemia  Thrombocytopenia  Monitor CBC closely and transfuse as indicated   CAD Stable, hold ACE, and atorvastatin .  Continue metoprolol     Hyperlipidemia Atorvastatin  on hold     History of CVA with left-sided hemiparesis Atorvastatin  on hold        Subjective:  Patient seen and examined at bedside this morning Denied worsening shortness of breath cough Awaiting MBBS by speech therapist today  Physical Exam: Constitutional: Elderly male laying in bed, confusion improving Cardiovascular: Normal rate, regular rhythm. No lower extremity edema  Pulmonary: Non labored breathing on room air, no wheezing or rales.   Abdominal: Soft. Normal bowel sounds. Non distended and non tender  Neurological: Alert and oriented to person only. Unable to assess  fully due to patient's mental status  Skin: Skin is warm and dry.      Dispo: PT OT has recommended SNF   Data Reviewed:      Latest Ref Rng & Units 03/21/2023    4:24 AM 03/20/2023    6:30 AM 03/19/2023    5:33 PM  BMP  Glucose 70 - 99 mg/dL 90  824  90   BUN 8 - 23 mg/dL 40  50  57   Creatinine 0.61 - 1.24 mg/dL 7.93  7.84  7.84   Sodium 135 - 145 mmol/L 134  132  134   Potassium 3.5 - 5.1 mmol/L 3.4  4.8  3.5   Chloride 98 - 111 mmol/L 108  109  106   CO2 22 - 32 mmol/L 23  20  23    Calcium  8.9 - 10.3 mg/dL 89.4  89.2  88.5        Latest Ref Rng & Units 03/21/2023    4:24 AM 03/19/2023   12:39 AM 03/18/2023    4:14 PM  CBC  WBC 4.0 - 10.5 K/uL 4.7  5.5  5.6   Hemoglobin 13.0 - 17.0 g/dL 7.6  8.1  9.7   Hematocrit 39.0 - 52.0 % 23.5  25.3  30.3   Platelets 150 - 400 K/uL 78  89  114     Vitals:   03/20/23 2027 03/21/23 0404 03/21/23 0754 03/21/23 1545  BP: (!) 148/75 (!) 148/74 (!) 151/81 (!) 144/82  Pulse: 70 62 68 75  Resp: 20 20    Temp: 98.7 F (37.1 C) 98.1 F (36.7 C) 97.7 F (36.5 C)  TempSrc: Oral Oral Oral   SpO2: 96% 98% 99% 100%  Weight:      Height:         Author: Drue ONEIDA Potter, MD 03/21/2023 3:51 PM  For on call review [email or web address].

## 2023-03-21 NOTE — Care Management Important Message (Signed)
 Important Message  Patient Details  Name: Laymond Postle MRN: [id number] Date of Birth: Dec 14, 1934   Important Message Given:  Yes - Medicare IM     Sherilyn Banker 03/21/2023, 2:36 PM

## 2023-03-22 LAB — CBC WITH DIFFERENTIAL/PLATELET
Abs Immature Granulocytes: 0.05 10*3/uL (ref 0.00–0.07)
Basophils Absolute: 0 10*3/uL (ref 0.0–0.1)
Basophils Relative: 0 %
Eosinophils Absolute: 0 10*3/uL (ref 0.0–0.5)
Eosinophils Relative: 1 %
HCT: 23.4 % — ABNORMAL LOW (ref 39.0–52.0)
Hemoglobin: 7.6 g/dL — ABNORMAL LOW (ref 13.0–17.0)
Immature Granulocytes: 1 %
Lymphocytes Relative: 26 %
Lymphs Abs: 1.2 10*3/uL (ref 0.7–4.0)
MCH: 30.5 pg (ref 26.0–34.0)
MCHC: 32.5 g/dL (ref 30.0–36.0)
MCV: 94 fL (ref 80.0–100.0)
Monocytes Absolute: 0.5 10*3/uL (ref 0.1–1.0)
Monocytes Relative: 11 %
Neutro Abs: 2.9 10*3/uL (ref 1.7–7.7)
Neutrophils Relative %: 61 %
Platelets: 80 10*3/uL — ABNORMAL LOW (ref 150–400)
RBC: 2.49 MIL/uL — ABNORMAL LOW (ref 4.22–5.81)
RDW: 16.6 % — ABNORMAL HIGH (ref 11.5–15.5)
WBC: 4.7 10*3/uL (ref 4.0–10.5)
nRBC: 0 % (ref 0.0–0.2)

## 2023-03-22 LAB — BASIC METABOLIC PANEL
Anion gap: 3 — ABNORMAL LOW (ref 5–15)
BUN: 39 mg/dL — ABNORMAL HIGH (ref 8–23)
CO2: 24 mmol/L (ref 22–32)
Calcium: 10.6 mg/dL — ABNORMAL HIGH (ref 8.9–10.3)
Chloride: 110 mmol/L (ref 98–111)
Creatinine, Ser: 2.3 mg/dL — ABNORMAL HIGH (ref 0.61–1.24)
GFR, Estimated: 27 mL/min — ABNORMAL LOW (ref >60)
Glucose, Bld: 88 mg/dL (ref 70–99)
Potassium: 3.9 mmol/L (ref 3.5–5.1)
Sodium: 137 mmol/L (ref 135–145)

## 2023-03-22 LAB — GLUCOSE, CAPILLARY: Glucose-Capillary: 106 mg/dL — ABNORMAL HIGH (ref 70–99)

## 2023-03-22 MED ORDER — ATORVASTATIN CALCIUM 20 MG PO TABS
20.0000 mg | ORAL_TABLET | Freq: Every day | ORAL | Status: DC
  Administered 2023-03-22 – 2023-04-05 (×13): 20 mg via ORAL
  Filled 2023-03-22 (×13): qty 1

## 2023-03-22 NOTE — Progress Notes (Signed)
 Progress Note   Patient: Hunter Bailey FMW:[id number] DOB: 1935/01/10 DOA: 03/18/2023     4 DOS: the patient was seen and examined on 03/22/2023   Brief hospital course: 89 year old gentleman with past medical history significant for HLD, HTN, CAD, BPH, H/o CVA w/ left hemiparesis.  Patient resides in independent living facility.  He was brought in with decreased p.o. liquid intake for the past 3 days. He was lethargic on admission.    In the ER patient's vital stable.  Sodium 130, potassium 3.3, creatinine 2.67 baseline normal, total protein greater than 12, hemoglobin 9.7 [baseline 11.8, done 12/09/2022], platelets 114 [baseline normal]. Calcium  elevated to 13.  EKG: Atrial fibrillation HR 66, QTc 468.   Assessment and Plan:   Hypercalcemia  Patient with normal calcium  12/09/22 and previous BMPs in 2023 with no hypercalcemia. Considered hypercalcemia of malignancy versus dehydration PTH level low 11 and ionized calcium  7.2 slightly high Continue rehydration Calcium -improved with hydration   Elevated serum creatinine, likely AKI  Hypokalemia  Continue repletion and monitoring   FTT PT and dietitian consult placed   Normocytic anemia  Thrombocytopenia  Monitor CBC closely and transfuse as indicated   CAD Stable, hold ACE, and atorvastatin .  Continue metoprolol     Hyperlipidemia Atorvastatin       History of CVA with left-sided hemiparesis Continue atorvastatin      Subjective:  Patient seen and examined at bedside this morning Denied worsening shortness of breath cough Underwent modified barium swallow yesterday and diet have been adjusted   Physical Exam: Constitutional: Elderly male laying in bed, confusion improving Cardiovascular: Normal rate, regular rhythm. No lower extremity edema  Pulmonary: Non labored breathing on room air, no wheezing or rales.   Abdominal: Soft. Normal bowel sounds. Non distended and non tender  Neurological: Alert and oriented to person  only. Unable to assess fully due to patient's mental status  Skin: Skin is warm and dry.      Dispo: PT OT has recommended SNF   Data Reviewed:    Latest Ref Rng & Units 03/22/2023    4:43 AM 03/21/2023    4:24 AM 03/19/2023   12:39 AM  CBC  WBC 4.0 - 10.5 K/uL 4.7  4.7  5.5   Hemoglobin 13.0 - 17.0 g/dL 7.6  7.6  8.1   Hematocrit 39.0 - 52.0 % 23.4  23.5  25.3   Platelets 150 - 400 K/uL 80  78  89        Latest Ref Rng & Units 03/22/2023    4:43 AM 03/21/2023    4:24 AM 03/20/2023    6:30 AM  BMP  Glucose 70 - 99 mg/dL 88  90  824   BUN 8 - 23 mg/dL 39  40  50   Creatinine 0.61 - 1.24 mg/dL 7.69  7.93  7.84   Sodium 135 - 145 mmol/L 137  134  132   Potassium 3.5 - 5.1 mmol/L 3.9  3.4  4.8   Chloride 98 - 111 mmol/L 110  108  109   CO2 22 - 32 mmol/L 24  23  20    Calcium  8.9 - 10.3 mg/dL 89.3  89.4  89.2     Vitals:   03/21/23 1937 03/22/23 0436 03/22/23 0812 03/22/23 1558  BP: (!) 150/81 (!) 148/79 (!) 154/81 (!) 154/84  Pulse: 73 65 67 76  Resp: 20 20    Temp: 98.3 F (36.8 C) 98.2 F (36.8 C)    TempSrc: Oral Oral  SpO2: 97% 99% 98% 99%  Weight:      Height:         Author: Drue ONEIDA Potter, MD 03/22/2023 4:12 PM  For on call review [email or web address].

## 2023-03-22 NOTE — Progress Notes (Signed)
 Physical Therapy Treatment Patient Details Name: Hunter Bailey MRN: [id number] DOB: 1934/06/21 Today's Date: 03/22/2023   History of Present Illness Hunter Bailey is an 25yoM who comes to [facility] on 03/18/23 after 3 days of decreased PO intake, pt now lethargic and appears dehydrated. Pt with elevated Ca++: 13. Pt resides at the [facility] of [address] PMH: CVA Bailey Lt hemiplegia.    PT Comments  Pt in bed on entry, breakfast not eaten. Pt is awake, has less dysarthria today but difficult to understand at times. Pt asks about when he's leaving and eager to get back to home. Min-modA for bed mobility, however transfers are less impaired, pt able to stand 6x with hemi walker and remain up 6x20sec, only minA this date which is a big improvement. Pt continues to struggle with requisite degree of anterior weight shift for unassisted transfers. Also has limited variability in standing postures when it comes to the ability to remain up and steady. Session ended a couple minutes early to allow for church friends to visit while here. Will continue to follow.    If plan is discharge home, recommend the following: Two people to help with walking and/or transfers;Direct supervision/assist for medications management;Direct supervision/assist for financial management;Assist for transportation;Assistance with cooking/housework   Can travel by private vehicle     No  Equipment Recommendations  None recommended by PT    Recommendations for Other Services       Precautions / Restrictions Precautions Precautions: Fall Restrictions Weight Bearing Restrictions Per Provider Order: No     Mobility  Bed Mobility Overal bed mobility: Needs Assistance Bed Mobility: Supine to Sit, Sit to Supine     Supine to sit: Mod assist Sit to supine: Mod assist        Transfers Overall transfer level: Needs assistance Equipment used: Hemi-walker Transfers: Sit to/from Stand Sit to Stand: Min assist, From elevated surface  (most physical assistance is for facilitating anterior weight shift prior to rise or intermittent righting/LOB correction in stance.)                Ambulation/Gait                   Stairs             Wheelchair Mobility     Tilt Bed    Modified Rankin (Stroke Patients Only)       Balance                                            Cognition Arousal: Alert Behavior During Therapy: WFL for tasks assessed/performed Overall Cognitive Status: Difficult to assess (is a bit less dysarthric today and easier to understand at times.)                                          Exercises Other Exercises Other Exercises: STS from elevated EOB with HW RUE, 6x20sec (decreased physical assist throughout, fairly steady once positioned right)    General Comments        Pertinent Vitals/Pain Pain Assessment Pain Assessment: No/denies pain    Home Living                          Prior Function  PT Goals (current goals can now be found in the care plan section) Acute Rehab PT Goals PT Goal Formulation: With patient Progress towards PT goals: Progressing toward goals    Frequency    Min 1X/week      PT Plan      Co-evaluation              AM-PAC PT 6 Clicks Mobility   Outcome Measure  Help needed turning from your back to your side while in a flat bed without using bedrails?: A Lot Help needed moving from lying on your back to sitting on the side of a flat bed without using bedrails?: A Lot Help needed moving to and from a bed to a chair (including a wheelchair)?: A Lot Help needed standing up from a chair using your arms (e.g., wheelchair or bedside chair)?: A Lot Help needed to walk in hospital room?: A Lot Help needed climbing 3-5 steps with a railing? : Total 6 Click Score: 11    End of Session   Activity Tolerance: Patient limited by fatigue;Patient tolerated treatment  well Patient left: in chair;with nursing/sitter in room;with call bell/phone within reach Nurse Communication: Mobility status PT Visit Diagnosis: Other abnormalities of gait and mobility (R26.89);Unsteadiness on feet (R26.81);Muscle weakness (generalized) (M62.81)     Time: 1120-1140 PT Time Calculation (min) (ACUTE ONLY): 20 min  Charges:    $Therapeutic Activity: 8-22 mins PT General Charges $$ ACUTE PT VISIT: 1 Visit                    2:55 PM, 03/22/23 Hunter Bailey, PT, DPT Physical Therapist - [facility]  [phone] (ASCOM)    Hunter Bailey 03/22/2023, 2:52 PM

## 2023-03-22 NOTE — Plan of Care (Signed)

## 2023-03-23 ENCOUNTER — Inpatient Hospital Stay: Payer: 59

## 2023-03-23 ENCOUNTER — Encounter: Payer: Self-pay | Admitting: Family Medicine

## 2023-03-23 DIAGNOSIS — N179 Acute kidney failure, unspecified: Secondary | ICD-10-CM | POA: Diagnosis not present

## 2023-03-23 DIAGNOSIS — G8194 Hemiplegia, unspecified affecting left nondominant side: Secondary | ICD-10-CM

## 2023-03-23 DIAGNOSIS — D518 Other vitamin B12 deficiency anemias: Secondary | ICD-10-CM

## 2023-03-23 DIAGNOSIS — D6481 Anemia due to antineoplastic chemotherapy: Secondary | ICD-10-CM | POA: Diagnosis not present

## 2023-03-23 LAB — CBC WITH DIFFERENTIAL/PLATELET
Abs Immature Granulocytes: 0.06 10*3/uL (ref 0.00–0.07)
Basophils Absolute: 0 10*3/uL (ref 0.0–0.1)
Basophils Relative: 0 %
Eosinophils Absolute: 0 10*3/uL (ref 0.0–0.5)
Eosinophils Relative: 0 %
HCT: 22.9 % — ABNORMAL LOW (ref 39.0–52.0)
Hemoglobin: 7.5 g/dL — ABNORMAL LOW (ref 13.0–17.0)
Immature Granulocytes: 1 %
Lymphocytes Relative: 23 %
Lymphs Abs: 1.2 10*3/uL (ref 0.7–4.0)
MCH: 31 pg (ref 26.0–34.0)
MCHC: 32.8 g/dL (ref 30.0–36.0)
MCV: 94.6 fL (ref 80.0–100.0)
Monocytes Absolute: 0.7 10*3/uL (ref 0.1–1.0)
Monocytes Relative: 13 %
Neutro Abs: 3.2 10*3/uL (ref 1.7–7.7)
Neutrophils Relative %: 63 %
Platelets: 86 10*3/uL — ABNORMAL LOW (ref 150–400)
RBC: 2.42 MIL/uL — ABNORMAL LOW (ref 4.22–5.81)
RDW: 16.9 % — ABNORMAL HIGH (ref 11.5–15.5)
WBC: 5.2 10*3/uL (ref 4.0–10.5)
nRBC: 0 % (ref 0.0–0.2)

## 2023-03-23 LAB — BASIC METABOLIC PANEL
Anion gap: 4 — ABNORMAL LOW (ref 5–15)
BUN: 40 mg/dL — ABNORMAL HIGH (ref 8–23)
CO2: 23 mmol/L (ref 22–32)
Calcium: 10.5 mg/dL — ABNORMAL HIGH (ref 8.9–10.3)
Chloride: 110 mmol/L (ref 98–111)
Creatinine, Ser: 2.51 mg/dL — ABNORMAL HIGH (ref 0.61–1.24)
GFR, Estimated: 24 mL/min — ABNORMAL LOW (ref >60)
Glucose, Bld: 106 mg/dL — ABNORMAL HIGH (ref 70–99)
Potassium: 3.5 mmol/L (ref 3.5–5.1)
Sodium: 137 mmol/L (ref 135–145)

## 2023-03-23 LAB — MRSA NEXT GEN BY PCR, NASAL: MRSA by PCR Next Gen: DETECTED — AB

## 2023-03-23 MED ORDER — LATANOPROST 0.005 % OP SOLN
1.0000 [drp] | Freq: Every day | OPHTHALMIC | Status: DC
  Administered 2023-03-23 – 2023-04-09 (×17): 1 [drp] via OPHTHALMIC
  Filled 2023-03-23 (×3): qty 2.5

## 2023-03-23 MED ORDER — LISINOPRIL 2.5 MG PO TABS
2.5000 mg | ORAL_TABLET | Freq: Every day | ORAL | Status: DC
  Administered 2023-03-23: 2.5 mg via ORAL
  Filled 2023-03-23: qty 1

## 2023-03-23 MED ORDER — VITAMIN B-12 1000 MCG PO TABS
1000.0000 ug | ORAL_TABLET | Freq: Every day | ORAL | Status: DC
  Administered 2023-03-24 – 2023-04-07 (×13): 1000 ug via ORAL
  Filled 2023-03-23 (×14): qty 1

## 2023-03-23 MED ORDER — POTASSIUM CHLORIDE 20 MEQ PO PACK
40.0000 meq | PACK | Freq: Once | ORAL | Status: AC
Start: 2023-03-23 — End: 2023-03-23
  Administered 2023-03-23: 40 meq via ORAL
  Filled 2023-03-23: qty 2

## 2023-03-23 MED ORDER — TIMOLOL MALEATE 0.5 % OP SOLN
1.0000 [drp] | Freq: Two times a day (BID) | OPHTHALMIC | Status: DC
  Administered 2023-03-23 – 2023-04-09 (×33): 1 [drp] via OPHTHALMIC
  Filled 2023-03-23 (×4): qty 5

## 2023-03-23 MED ORDER — CLOPIDOGREL BISULFATE 75 MG PO TABS
75.0000 mg | ORAL_TABLET | Freq: Every day | ORAL | Status: DC
  Administered 2023-03-23 – 2023-03-26 (×4): 75 mg via ORAL
  Filled 2023-03-23 (×4): qty 1

## 2023-03-23 MED ORDER — ALUM & MAG HYDROXIDE-SIMETH 200-200-20 MG/5ML PO SUSP: 30.0000 mL | Freq: Four times a day (QID) | ORAL | Status: DC | PRN

## 2023-03-23 MED ORDER — CHLORHEXIDINE GLUCONATE CLOTH 2 % EX PADS
6.0000 | MEDICATED_PAD | Freq: Every day | CUTANEOUS | Status: AC
  Administered 2023-03-24 – 2023-03-28 (×5): 6 via TOPICAL

## 2023-03-23 MED ORDER — ASPIRIN 81 MG PO CHEW
81.0000 mg | CHEWABLE_TABLET | Freq: Every day | ORAL | Status: DC
  Administered 2023-03-23 – 2023-03-25 (×3): 81 mg via ORAL
  Filled 2023-03-23 (×3): qty 1

## 2023-03-23 MED ORDER — TRAZODONE HCL 50 MG PO TABS
50.0000 mg | ORAL_TABLET | Freq: Every day | ORAL | Status: DC
  Administered 2023-03-23 – 2023-03-26 (×4): 50 mg via ORAL
  Filled 2023-03-23 (×4): qty 1

## 2023-03-23 MED ORDER — MUPIROCIN 2 % EX OINT
1.0000 | TOPICAL_OINTMENT | Freq: Two times a day (BID) | CUTANEOUS | Status: AC
  Administered 2023-03-23 – 2023-03-27 (×10): 1 via NASAL
  Filled 2023-03-23 (×2): qty 22

## 2023-03-23 NOTE — Consult Note (Signed)
 Hematology/Oncology Consult note [phone] [phone]      Patient Care Team: [facility] as PCP - General   Name of the patient: Hunter Bailey  [id number]  1935/03/13   REASON FOR COSULTATION:  Hypercalcemia, suspicion for multiple myeloma. History of presenting illness-  89 y.o. male with PMH listed at below who presents to ER for evaluation of poor oral intake.  Patient lethargic and is a poor historian.  Medical history was obtained from reviewing records.  Patient was found to have hypercalcemia 03/18/2023 initial calcium  level 13, improved after hydration.  03/23/2023 calcium  decreased to 10.5.  Total protein level more than 12. He was also found to have acute kidney failure with creatinine 2.67 at presentation.  Patient had a normal creatinine of 1.2 on 12/09/2022. He also has hemoglobin of 9.7 at presentation which gradually decreased during current admission.  His baseline hemoglobin in September 2024 was 11.8. Protein electrophoresis has been sent result is pending. Light chain ratio was elevated at 357, kappa free light chain level at 3713 Oncology was consulted for further evaluation.  Patient has a history of stroke with left hemiparesis.  Patient lives in independent living facility.   Allergies  Allergen Reactions   Penicillins Other (See Comments)    Patient Active Problem List   Diagnosis Date Noted   Hypercalcemia 03/18/2023   AKI (acute kidney injury) (HCC) 03/18/2023   Hypokalemia 03/18/2023   Hyponatremia 03/18/2023   Anemia associated with chemotherapy 03/18/2023   Acute ST elevation myocardial infarction (STEMI) involving left anterior descending (LAD) coronary artery (HCC) 06/09/2021   Chronic kidney disease, stage 3a (HCC) 06/09/2021   BPH (benign prostatic hyperplasia) 06/09/2021   Coronary disease 06/09/2021   Gastro-esophageal reflux disease without esophagitis 06/09/2021   Other vitamin B12 deficiency anemias  06/09/2021   Peripheral vascular disease (HCC) 06/09/2021   Type 2 diabetes mellitus without complications (HCC) 06/09/2021   Left hemiparesis (HCC) 01/08/2021     Past Medical History:  Diagnosis Date   Angina of effort (HCC)    BPH (benign prostatic hyperplasia)    Coronary artery disease    Encephalopathy    Hemiparesis (HCC)    Hyperlipidemia    Hypertension    MI (myocardial infarction) (HCC)    Stroke [facility])      Past Surgical History:  Procedure Laterality Date   CORONARY/GRAFT ACUTE MI REVASCULARIZATION N/A 06/09/2021   Procedure: Coronary/Graft Acute MI Revascularization;  Surgeon: Ammon Blunt, MD;  Location: [facility] INVASIVE CV LAB;  Service: Cardiovascular;  Laterality: N/A;   LEFT HEART CATH AND CORONARY ANGIOGRAPHY N/A 06/09/2021   Procedure: LEFT HEART CATH AND CORONARY ANGIOGRAPHY;  Surgeon: Ammon Blunt, MD;  Location: [facility] INVASIVE CV LAB;  Service: Cardiovascular;  Laterality: N/A;    Social History   Socioeconomic History   Marital status: Divorced    Spouse name: Not on file   Number of children: Not on file   Years of education: Not on file   Highest education level: Not on file  Occupational History   Not on file  Tobacco Use   Smoking status: Former    Current packs/day: 0.00    Types: Cigarettes    Quit date: 2020    Years since quitting: 5.0    Passive exposure: Past   Smokeless tobacco: Never  Vaping Use   Vaping status: Never Used  Substance and Sexual Activity   Alcohol  use: Not Currently   Drug use: Not Currently   Sexual activity: Not  Currently  Other Topics Concern   Not on file  Social History Narrative   Not on file   Social Drivers of Health   Financial Resource Strain: Not on file  Food Insecurity: Patient Unable To Answer (03/22/2023)   Hunger Vital Sign    Worried About Running Out of Food in the Last Year: Patient unable to answer    Ran Out of Food in the Last Year: Patient unable to answer   Transportation Needs: Patient Unable To Answer (03/22/2023)   PRAPARE - Transportation    Lack of Transportation (Medical): Patient unable to answer    Lack of Transportation (Non-Medical): Patient unable to answer  Physical Activity: Not on file  Stress: Not on file  Social Connections: Unknown (03/22/2023)   Social Connection and Isolation Panel [NHANES]    Frequency of Communication with Friends and Family: Patient unable to answer    Frequency of Social Gatherings with Friends and Family: Patient unable to answer    Attends Religious Services: Patient declined    Active Member of Clubs or Organizations: Patient declined    Attends Banker Meetings: Patient unable to answer    Marital Status: Patient unable to answer  Intimate Partner Violence: Patient Unable To Answer (03/22/2023)   Humiliation, Afraid, Rape, and Kick questionnaire    Fear of Current or Ex-Partner: Patient unable to answer    Emotionally Abused: Patient unable to answer    Physically Abused: Patient unable to answer    Sexually Abused: Patient unable to answer     History reviewed. No pertinent family history.   Current Facility-Administered Medications:    acetaminophen  (TYLENOL ) tablet 650 mg, 650 mg, Oral, Q6H PRN **OR** acetaminophen  (TYLENOL ) suppository 650 mg, 650 mg, Rectal, Q6H PRN, Crosley, Debby, MD   alum & mag hydroxide-simeth (MAALOX/MYLANTA) 200-200-20 MG/5ML suspension 30 mL, 30 mL, Oral, Q6H PRN, Sreeram, Narendranath, MD   aspirin  chewable tablet 81 mg, 81 mg, Oral, Daily, Sreeram, Narendranath, MD, 81 mg at 03/23/23 0933   atorvastatin  (LIPITOR ) tablet 20 mg, 20 mg, Oral, Daily, Djan, Prince T, MD, 20 mg at 03/23/23 0933   [START ON 03/24/2023] Chlorhexidine  Gluconate Cloth 2 % PADS 6 each, 6 each, Topical, Q0600, Sreeram, Narendranath, MD   clopidogrel  (PLAVIX ) tablet 75 mg, 75 mg, Oral, Daily, Sreeram, Narendranath, MD, 75 mg at 03/23/23 0933   feeding supplement (ENSURE ENLIVE /  ENSURE PLUS) liquid 237 mL, 237 mL, Oral, BID BM, Crosley, Debby, MD, 237 mL at 03/23/23 1325   fluconazole  (DIFLUCAN ) IVPB 200 mg, 200 mg, Intravenous, Q24H, Djan, Prince T, MD, Last Rate: 100 mL/hr at 03/22/23 2203, 200 mg at 03/22/23 2203   heparin  injection 5,000 Units, 5,000 Units, Subcutaneous, Q8H, Crosley, Debby, MD, 5,000 Units at 03/23/23 1325   latanoprost  (XALATAN ) 0.005 % ophthalmic solution 1 drop, 1 drop, Both Eyes, QHS, Sreeram, Narendranath, MD   metoprolol  tartrate (LOPRESSOR ) injection 5 mg, 5 mg, Intravenous, Q4H PRN, Crosley, Debby, MD   mupirocin  ointment (BACTROBAN ) 2 % 1 Application, 1 Application, Nasal, BID, Sreeram, Narendranath, MD, 1 Application at 03/23/23 1325   ondansetron  (ZOFRAN ) tablet 4 mg, 4 mg, Oral, Q6H PRN **OR** ondansetron  (ZOFRAN ) injection 4 mg, 4 mg, Intravenous, Q6H PRN, Crosley, Debby, MD   timolol  (TIMOPTIC ) 0.5 % ophthalmic solution 1 drop, 1 drop, Both Eyes, BID, Sreeram, Narendranath, MD, 1 drop at 03/23/23 1059   traZODone  (DESYREL ) tablet 50 mg, 50 mg, Oral, QHS, Sreeram, Narendranath, MD  Facility-Administered Medications Ordered in Other Encounters:  clopidogrel  (PLAVIX ) tablet, , , PRN, Paraschos, Alexander, MD, 600 mg at 06/09/21 2049  Review of Systems  Unable to perform ROS: Mental status change    PHYSICAL EXAM Vitals:   03/22/23 2011 03/23/23 0442 03/23/23 0801 03/23/23 1657  BP: (!) 152/85 (!) 144/74 (!) 158/74 (!) 143/70  Pulse: 75 62 71 64  Resp: 16 18 18 18   Temp: 99 F (37.2 C) 97.8 F (36.6 C) 97.9 F (36.6 C) 98.7 F (37.1 C)  TempSrc: Oral Oral Axillary   SpO2: 99% 97% 100% 96%  Weight:      Height:       Physical Exam Constitutional:      General: He is not in acute distress.    Comments: Lethargic  HENT:     Head: Normocephalic and atraumatic.  Eyes:     General: No scleral icterus. Cardiovascular:     Rate and Rhythm: Normal rate. Rhythm irregular.  Pulmonary:     Effort: Pulmonary effort is normal.  No respiratory distress.  Abdominal:     General: Bowel sounds are normal.     Palpations: Abdomen is soft.  Musculoskeletal:        General: Normal range of motion.     Cervical back: Normal range of motion and neck supple.     Comments: Left hemiparesis  Skin:    General: Skin is warm and dry.     Findings: No erythema.  Neurological:     Motor: No abnormal muscle tone.     Comments: Lethargic and confused.  Psychiatric:        Mood and Affect: Affect normal.       LABORATORY STUDIES    Latest Ref Rng & Units 03/23/2023    4:07 AM 03/22/2023    4:43 AM 03/21/2023    4:24 AM  CBC  WBC 4.0 - 10.5 K/uL 5.2  4.7  4.7   Hemoglobin 13.0 - 17.0 g/dL 7.5  7.6  7.6   Hematocrit 39.0 - 52.0 % 22.9  23.4  23.5   Platelets 150 - 400 K/uL 86  80  78       Latest Ref Rng & Units 03/23/2023    4:07 AM 03/22/2023    4:43 AM 03/21/2023    4:24 AM  CMP  Glucose 70 - 99 mg/dL 893  88  90   BUN 8 - 23 mg/dL 40  39  40   Creatinine 0.61 - 1.24 mg/dL 7.48  7.69  7.93   Sodium 135 - 145 mmol/L 137  137  134   Potassium 3.5 - 5.1 mmol/L 3.5  3.9  3.4   Chloride 98 - 111 mmol/L 110  110  108   CO2 22 - 32 mmol/L 23  24  23    Calcium  8.9 - 10.3 mg/dL 89.4  89.3  89.4      RADIOGRAPHIC STUDIES: I have personally reviewed the radiological images as listed and agreed with the findings in the report. US  RENAL Result Date: 03/23/2023 CLINICAL DATA:  Acute kidney injury EXAM: RENAL / URINARY TRACT ULTRASOUND COMPLETE COMPARISON:  Ultrasound 2015.  CT renal stone 08/16/2021. FINDINGS: Right Kidney: Renal measurements: 11.7 x 6.0 x 5.9 cm = volume: 215 mL. No collecting system dilatation or perinephric fluid. Upper pole renal cyst measuring 2.9 x 3.0 x 3.0 cm. Two thousand fifteen this has smaller at 15 mm. This has seen on prior CT as well. Left Kidney: Imaging of the left kidney. Patient declined additional images  as per the sonographer of the left kidney due to pain. Bladder: Underdistended. Diffuse wall  thickening. Large prostate. Prostate was not able to be measured by the sonographer at this time. Ureteral jets were seen. Other: None. IMPRESSION: Limited examination as the patient did not want to continue due to pain. Left kidney is not imaged. No right-sided renal collecting system dilatation. Persistent upper pole right-sided renal cysts. Enlarged prostate with mass effect along the base of the bladder. Bladder wall thickening and trabeculation. Electronically Signed   By: Ranell Bring M.D.   On: 03/23/2023 16:59   DG Swallowing Func-Speech Pathology Result Date: 03/21/2023 Table formatting from the original result was not included. Modified Barium Swallow Study Patient Details Name: Hunter Bailey MRN: [id number] Date of Birth: 1935/01/21 Today's Date: 03/21/2023 HPI/PMH: HPI: Pt is an 88yoM who comes to [facility] on 03/18/23 after 3 days of decreased PO intake, pt now lethargic and appears dehydrated. Pt with elevated Ca++ 13.  Pt resides at the [facility] [address] PMH: CVA w/ Left hemiplegia.   CXR: No active disease.  Of Note: Pt indicated he had been being tx'd for UTI problems at the NH in the past month. Clinical Impression: Clinical Impression: Pt presents with moderate oropharyngeal dysphagia that places him at a high risk of aspiration when consuming thin liquids and nectar thick liquids via cup sips.     Pt's oral phase is c/b repetitive tongue movements during bolus propulsion and overall decreased mastication observed.     Pt's pharyngeal phase is c/b severely delayed swallow initiation with boluses resting in the pyriform sinuses for > 5 seconds. When consuming cup sips of thin liquids and nectar thick liquids, boluses spill into airway d/t larger bolus size and inability to contain them within his pyriform sinuses. Aspiration is reduced when consuming thin liquids and nectar thick liquids via spoon.     Given the above, recommend dysphagia 1 diet with thin liquids via spoon, medicine crushed in  applesause with strict adherence to supervision and aspiration precautions. Factors that may increase risk of adverse event in presence of aspiration [name] & [name] 2021): Factors that may increase risk of adverse event in presence of aspiration [name] & [name] 2021): Reduced cognitive function; Limited mobility; Dependence for feeding and/or oral hygiene Recommendations/Plan: Swallowing Evaluation Recommendations Swallowing Evaluation Recommendations Recommendations: PO diet PO Diet Recommendation: Dysphagia 1 (Pureed); Thin liquids (Level 0) Liquid Administration via: Spoon Medication Administration: Crushed with puree Supervision: Staff to assist with self-feeding; Full supervision/cueing for swallowing strategies Swallowing strategies  : Minimize environmental distractions; Slow rate; Small bites/sips Postural changes: Position pt fully upright for meals; Stay upright 30-60 min after meals Oral care recommendations: Oral care BID (2x/day) Caregiver Recommendations: -- (TBD) Treatment Plan Treatment Plan Treatment recommendations: No treatment recommended at this time Follow-up recommendations: No SLP follow up Functional status assessment: Patient has had a recent decline in their functional status and/or demonstrates limited ability to make significant improvements in function in a reasonable and predictable amount of time. Recommendations Recommendations for follow up therapy are one component of a multi-disciplinary discharge planning process, led by the attending physician.  Recommendations may be updated based on patient status, additional functional criteria and insurance authorization. Assessment: Orofacial Exam: Orofacial Exam Oral Cavity: Oral Hygiene: WFL Oral Cavity - Dentition: Poor condition; Missing dentition Orofacial Anatomy: WFL Oral Motor/Sensory Function: Unable to test (left side facial weakness) Anatomy: Anatomy: WFL; Suspected cervical osteophytes Boluses Administered: Boluses  Administered Boluses Administered: Thin liquids (Level 0);  Mildly thick liquids (Level 2, nectar thick); Puree  Oral Impairment Domain: Oral Impairment Domain Lip Closure: Escape beyond mid-chin (on left) Tongue control during bolus hold: Not tested Bolus preparation/mastication: Minimal chewing/mashing with majority of bolus unchewed Bolus transport/lingual motion: Repetitive/disorganized tongue motion Oral residue: Trace residue lining oral structures Location of oral residue : Floor of mouth; Tongue; Palate Initiation of pharyngeal swallow : Pyriform sinuses  Pharyngeal Impairment Domain: Pharyngeal Impairment Domain Soft palate elevation: No bolus between soft palate (SP)/pharyngeal wall (PW) Laryngeal elevation: Partial superior movement of thyroid  cartilage/partial approximation of arytenoids to epiglottic petiole Anterior hyoid excursion: Partial anterior movement Epiglottic movement: Complete inversion Laryngeal vestibule closure: Incomplete, narrow column air/contrast in laryngeal vestibule Pharyngeal stripping wave : Present - complete Pharyngeal contraction (A/P view only): Complete Pharyngoesophageal segment opening: Complete distension and complete duration, no obstruction of flow Tongue base retraction: No contrast between tongue base and posterior pharyngeal wall (PPW) Pharyngeal residue: Complete pharyngeal clearance  Esophageal Impairment Domain: Esophageal Impairment Domain Esophageal clearance upright position: Complete clearance, esophageal coating Pill: No data recorded Penetration/Aspiration Scale Score: Penetration/Aspiration Scale Score 1.  Material does not enter airway: Thin liquids (Level 0); Mildly thick liquids (Level 2, nectar thick); Puree 8.  Material enters airway, passes BELOW cords without attempt by patient to eject out (silent aspiration) : Thin liquids (Level 0); Mildly thick liquids (Level 2, nectar thick) (vis cup sips) Compensatory Strategies: Compensatory Strategies  Compensatory strategies: No   General Information: Caregiver present: No  Diet Prior to this Study: NPO   Temperature : Normal   Respiratory Status: WFL   Supplemental O2: None (Room air)   History of Recent Intubation: No  Behavior/Cognition: Alert; Distractible; Requires cueing; Doesn't follow directions Self-Feeding Abilities: Needs set-up for self-feeding; Needs assist with self-feeding; Needs hand-over-hand assist for feeding Baseline vocal quality/speech: Normal Volitional Cough: Unable to elicit Volitional Swallow: Unable to elicit Exam Limitations: No limitations Goal Planning: Prognosis for improved oropharyngeal function: Fair Barriers to Reach Goals: Cognitive deficits; Time post onset; Severity of deficits Barriers/Prognosis Comment: old R CVA; missing/poor Dentition; LUE weakness Patient/Family Stated Goal: none stated Consulted and agree with results and recommendations: Patient; Physician; Nurse Pain: Pain Assessment Pain Assessment: Faces Faces Pain Scale: 4 Pain Location: R shoulder Pain Descriptors / Indicators: Aching; Sore Pain Intervention(s): Monitored during session; Repositioned End of Session: Start Time:SLP Start Time (ACUTE ONLY): 1135 Stop Time: SLP Stop Time (ACUTE ONLY): 1155 Time Calculation:SLP Time Calculation (min) (ACUTE ONLY): 20 min Charges: SLP Evaluations $ SLP Speech Visit: 1 Visit SLP Evaluations $BSS Swallow: 1 Procedure $MBS Swallow: 1 Procedure SLP visit diagnosis: SLP Visit Diagnosis: Dysphagia, oropharyngeal phase (R13.12) Past Medical History: Past Medical History: Diagnosis Date  Angina of effort (HCC)   BPH (benign prostatic hyperplasia)   Coronary artery disease   Encephalopathy   Hemiparesis (HCC)   Hyperlipidemia   Hypertension   MI (myocardial infarction) (HCC)   Stroke [facility])  Past Surgical History: Past Surgical History: Procedure Laterality Date  CORONARY/GRAFT ACUTE MI REVASCULARIZATION N/A 06/09/2021  Procedure: Coronary/Graft Acute MI Revascularization;   Surgeon: Ammon Blunt, MD;  Location: [facility] INVASIVE CV LAB;  Service: Cardiovascular;  Laterality: N/A;  LEFT HEART CATH AND CORONARY ANGIOGRAPHY N/A 06/09/2021  Procedure: LEFT HEART CATH AND CORONARY ANGIOGRAPHY;  Surgeon: Ammon Blunt, MD;  Location: [facility] INVASIVE CV LAB;  Service: Cardiovascular;  Laterality: N/A; Happi Overton 03/21/2023, 12:25 PM    Assessment and plan-   # Anemia, AKI, hypercalcemia, elevated kappa light chain with increased light chain  ratio.  Protein electrophoresis is pending. Suspect underlying plasma cell disorder-i.e. multiple myeloma.  Check beta-2 microglobulin level, LDH Consider bone marrow biopsy.  Will call patient's family member for consent.  # Hypercalcemia calcium  has improved after hydration.  Currently 10.5.  PTH is decreased likely secondary hypercalcemia. # AKI, pending above workup.  US  renal showed no hydronephrosis.  Avoid nephrotoxins.  Consider starting steroids after bone marrow biopsy.  # Anemia, hemoglobin 7.6.  Multifactorial.  Possibly due to underlying bone marrow involvement of plasma cell disorders. B12 is also borderline low.  Recommend patient to start on B12 supplementation.  Thank you for allowing me to participate in the care of this patient.   Zelphia Cap, MD, PhD Hematology Oncology 03/23/2023

## 2023-03-23 NOTE — TOC Progression Note (Addendum)
 Transition of Care [facility]) - Progression Note    Patient Details  Name: Hunter Bailey MRN: [id number] Date of Birth: 1934/04/21  [facility] CM/SW Contact  Corean ONEIDA Haddock, RN Phone Number: 03/23/2023, 10:07 AM  Clinical Narrative:     Bed offers provided to niece [name].  She is to call TOC back with her decision      Update:  received return call from niece 66.  She accepts bed at [facility]. Accepted bed in [facility] and notified Tammy at [facility] with TOC to start auth   Expected Discharge Plan and Services                                               Social Determinants of Health (SDOH) Interventions SDOH Screenings   Food Insecurity: Patient Unable To Answer (03/22/2023)  Housing: Patient Unable To Answer (03/22/2023)  Transportation Needs: Patient Unable To Answer (03/22/2023)  Utilities: Patient Unable To Answer (03/22/2023)  Social Connections: Unknown (03/22/2023)  Tobacco Use: Medium Risk (03/23/2023)    Readmission Risk Interventions     No data to display

## 2023-03-23 NOTE — Progress Notes (Signed)
 Progress Note   Patient: Hunter Bailey FMW:[id number] DOB: Mar 20, 1934 DOA: 03/18/2023     5 DOS: the patient was seen and examined on 03/23/2023   Brief hospital course: 89 year old gentleman with past medical history significant for HLD, HTN, CAD, BPH, H/o CVA w/ left hemiparesis.  Patient resides in independent living facility.  He was brought in with decreased p.o. liquid intake for the past 3 days. He was lethargic on admission.    In the ER patient's vital stable.  Sodium 130, potassium 3.3, creatinine 2.67 baseline normal, total protein greater than 12, hemoglobin 9.7 [baseline 11.8, done 12/09/2022], platelets 114 [baseline normal]. Calcium  elevated to 13.  EKG: Atrial fibrillation HR 66, QTc 468.  Assessment and Plan: Hypercalcemia  AKI Acute anemia High suspicion for multiple myeloma Patient with normal calcium  12/09/22 and previous BMPs in 2023 with no hypercalcemia. Considered hypercalcemia of malignancy versus dehydration PTH level low 11 and ionized calcium  7.2 slightly high Calcium  10.5 today with rehydration Protein >12 - will get protein electrophoresis for M Spike. Oncology evaluation called for further evaluation.   Hypokalemia  Continue repletion and monitoring   FTT PT and dietitian consult on board. PT recommended SNF.   Thrombocytopenia  Platelets lower side but stable. Monitor CBC closely and transfuse as indicated   CAD Stable, continue atorvastatin .  Continue metoprolol     Hyperlipidemia History of CVA with left-sided hemiparesis Continue aspirin , Atorvastatin .     Out of bed to chair. Incentive spirometry. Nursing supportive care. Fall, aspiration precautions. DVT prophylaxis   Code Status: Full Code  Subjective: Patient is seen and examined today morning. He is able to answer me. Is eating poor, very weak. Calcium  level down to 10.5  Physical Exam: Vitals:   03/22/23 1558 03/22/23 2011 03/23/23 0442 03/23/23 0801  BP: (!) 154/84 (!)  152/85 (!) 144/74 (!) 158/74  Pulse: 76 75 62 71  Resp:  16 18 18   Temp:  99 F (37.2 C) 97.8 F (36.6 C) 97.9 F (36.6 C)  TempSrc:  Oral Oral Axillary  SpO2: 99% 99% 97% 100%  Weight:      Height:        General - Elderly weak African American male, no apparent distress HEENT - PERRLA, EOMI, atraumatic head, non tender sinuses. Lung - Clear, basal rales, no rhonchi, wheezes. Heart - S1, S2 heard, no murmurs, rubs, trace pedal edema. Abdomen - Soft, non tender, bowel sounds good Neuro - Alert, awake and oriented, non focal exam. Skin - Warm and dry.  Data Reviewed:      Latest Ref Rng & Units 03/23/2023    4:07 AM 03/22/2023    4:43 AM 03/21/2023    4:24 AM  CBC  WBC 4.0 - 10.5 K/uL 5.2  4.7  4.7   Hemoglobin 13.0 - 17.0 g/dL 7.5  7.6  7.6   Hematocrit 39.0 - 52.0 % 22.9  23.4  23.5   Platelets 150 - 400 K/uL 86  80  78       Latest Ref Rng & Units 03/23/2023    4:07 AM 03/22/2023    4:43 AM 03/21/2023    4:24 AM  BMP  Glucose 70 - 99 mg/dL 893  88  90   BUN 8 - 23 mg/dL 40  39  40   Creatinine 0.61 - 1.24 mg/dL 7.48  7.69  7.93   Sodium 135 - 145 mmol/L 137  137  134   Potassium 3.5 - 5.1 mmol/L 3.5  3.9  3.4   Chloride 98 - 111 mmol/L 110  110  108   CO2 22 - 32 mmol/L 23  24  23    Calcium  8.9 - 10.3 mg/dL 89.4  89.3  89.4    No results found.   Family Communication: Discussed with niece, he understand and agree. All questions answereed.  Disposition: Status is: Inpatient Remains inpatient appropriate because: hypercalcemia work up. Oncology evaluation.  Planned Discharge Destination: Skilled nursing facility     Time spent: 38 minutes  Author: Concepcion Riser, MD 03/23/2023 3:15 PM Secure chat 7am to 7pm For on call review [email or web address].

## 2023-03-24 ENCOUNTER — Inpatient Hospital Stay: Payer: 59

## 2023-03-24 DIAGNOSIS — N179 Acute kidney failure, unspecified: Secondary | ICD-10-CM | POA: Diagnosis not present

## 2023-03-24 DIAGNOSIS — G8194 Hemiplegia, unspecified affecting left nondominant side: Secondary | ICD-10-CM | POA: Diagnosis not present

## 2023-03-24 DIAGNOSIS — D6481 Anemia due to antineoplastic chemotherapy: Secondary | ICD-10-CM | POA: Diagnosis not present

## 2023-03-24 LAB — CBC
HCT: 23.8 % — ABNORMAL LOW (ref 39.0–52.0)
Hemoglobin: 7.7 g/dL — ABNORMAL LOW (ref 13.0–17.0)
MCH: 31.6 pg (ref 26.0–34.0)
MCHC: 32.4 g/dL (ref 30.0–36.0)
MCV: 97.5 fL (ref 80.0–100.0)
Platelets: 83 10*3/uL — ABNORMAL LOW (ref 150–400)
RBC: 2.44 MIL/uL — ABNORMAL LOW (ref 4.22–5.81)
RDW: 16.9 % — ABNORMAL HIGH (ref 11.5–15.5)
WBC: 6.1 10*3/uL (ref 4.0–10.5)
nRBC: 0.3 % — ABNORMAL HIGH (ref 0.0–0.2)

## 2023-03-24 LAB — COMPREHENSIVE METABOLIC PANEL
ALT: 18 U/L (ref 0–44)
AST: 18 U/L (ref 15–41)
Albumin: 2.3 g/dL — ABNORMAL LOW (ref 3.5–5.0)
Alkaline Phosphatase: 31 U/L — ABNORMAL LOW (ref 38–126)
Anion gap: 3 — ABNORMAL LOW (ref 5–15)
BUN: 42 mg/dL — ABNORMAL HIGH (ref 8–23)
CO2: 22 mmol/L (ref 22–32)
Calcium: 10.4 mg/dL — ABNORMAL HIGH (ref 8.9–10.3)
Chloride: 110 mmol/L (ref 98–111)
Creatinine, Ser: 2.42 mg/dL — ABNORMAL HIGH (ref 0.61–1.24)
GFR, Estimated: 25 mL/min — ABNORMAL LOW (ref >60)
Glucose, Bld: 103 mg/dL — ABNORMAL HIGH (ref 70–99)
Potassium: 4 mmol/L (ref 3.5–5.1)
Sodium: 135 mmol/L (ref 135–145)
Total Bilirubin: 0.8 mg/dL (ref 0.0–1.2)
Total Protein: 12 g/dL — ABNORMAL HIGH (ref 6.5–8.1)

## 2023-03-24 MED ORDER — ENSURE ENLIVE PO LIQD
237.0000 mL | Freq: Three times a day (TID) | ORAL | Status: DC
  Administered 2023-03-24 – 2023-04-08 (×31): 237 mL via ORAL

## 2023-03-24 NOTE — Consult Note (Signed)
 [facility] Inpatient Rehabilitation Liaison Note  03/24/2023  Hunter Bailey 09/22/1934 [id number]  Location: RN Hospital Liaison screened the patient remotely at [facility].  Insurance: [employer]   [name] is a 89 y.o. male who is a Primary Care Patient of [facility] The patient was screened for  readmission hospitalization with noted medium risk score for unplanned readmission risk with 1 IP/2 ED in 6 months.  The patient was assessed for potential Care Management service needs for post hospital transition for care coordination. Review of patient's electronic medical record reveals patient was admitted for Hypercalcemia. Pt transitioned to SNF level of care ([facility]). Liaison will collaborate with PAC-RN on pt's post discharged to SNF.   Plan: [facility] Liaison will continue to follow progress and disposition to asess for post hospital community care coordination/management needs.  Referral request for community care coordination: Liaison will collaborate with PAC-RN on pt's disposition for SNF.    [facility] Care Management/Population Health does not replace or interfere with any arrangements made by the Inpatient Transition of Care team.   For questions contact:   Olam Ku, RN, [facility] Liaison [place]   [facility], Population Health Office Hours MTWF  8:00 am-6:00 pm Direct Dial: [phone] mobile [phone] [Office toll free line] Office Hours are M-F 8:30 - 5 pm [email or web address]

## 2023-03-24 NOTE — TOC Progression Note (Signed)
 Transition of Care [facility]) - Progression Note    Patient Details  Name: Hunter Bailey MRN: [id number] Date of Birth: 13-Mar-1935  [facility] CM/SW Contact  Corean ONEIDA Haddock, RN Phone Number: 03/24/2023, 10:37 AM  Clinical Narrative:      Insurance auth valid for [facility] through 1/11 Patient now with oncology consult for suspected multiple myeloma Oncology recommending biopsy        Expected Discharge Plan and Services                                               Social Determinants of Health (SDOH) Interventions SDOH Screenings   Food Insecurity: Patient Unable To Answer (03/22/2023)  Housing: Patient Unable To Answer (03/22/2023)  Transportation Needs: Patient Unable To Answer (03/22/2023)  Utilities: Patient Unable To Answer (03/22/2023)  Social Connections: Unknown (03/22/2023)  Tobacco Use: Medium Risk (03/23/2023)    Readmission Risk Interventions     No data to display

## 2023-03-24 NOTE — Progress Notes (Signed)
 Occupational Therapy Treatment Patient Details Name: Kennon Encinas MRN: [id number] DOB: [birth year] Today's Date: 03/24/2023   History of present illness Nazario Russom is an 59yoM who comes to [facility] on 03/18/23 after 3 days of decreased PO intake, pt now lethargic and appears dehydrated. Pt with elevated Ca++: 13. Pt resides at the [facility] [address] PMH: CVA c Lt hemiplegia.   OT comments  Pt is supine in bed on arrival. Lethargic, but with verb and tactile cues he is agreeable to OT session. He complains of R shoulder pain with certain movements, noted to grimace. Pt required increased levels of assist to reach EOB today d/t lethargy, seemingly more weak and needing verb cues and increased time to perform all tasks. Deferred any standing trials d/t needed Mod/Max A to maintain his seated balance at EOB x~5 mins with noted R lateral lean and increased fatigue. Max A overall for all bed mobility and Mod A to roll to the L side for linens to be changed with assist from NT d/t being soiled.  Pt required SUP for self feeding once repositioned better in bed.  Pt returned to bed with all needs in place and will cont to require skilled acute OT services to maximize his safety and IND to return to PLOF.       If plan is discharge home, recommend the following:  A lot of help with walking and/or transfers;A lot of help with bathing/dressing/bathroom;Direct supervision/assist for financial management;Supervision due to cognitive status;Assistance with cooking/housework;Assist for transportation;Help with stairs or ramp for entrance;Direct supervision/assist for medications management;Two people to help with walking and/or transfers   Equipment Recommendations  Other (comment) (defer to next venue)    Recommendations for Other Services      Precautions / Restrictions Restrictions Weight Bearing Restrictions Per Provider Order: No       Mobility Bed Mobility Overal bed mobility: Needs Assistance Bed  Mobility: Supine to Sit, Sit to Supine, Rolling Rolling: Mod assist   Supine to sit: Max assist, Mod assist, Used rails, HOB elevated Sit to supine: Max assist   General bed mobility comments: pt more lethargic this morning needing increased time to arouse; req Max to Mod A for bed mobility to reach EOB; Max A for foward and lateral scoot today; Mod A for rolling to change soiled linens    Transfers                   General transfer comment: deferred d/t poor seated balance     Balance Overall balance assessment: Needs assistance Sitting-balance support: Feet supported, Single extremity supported Sitting balance-Leahy Scale: Poor Sitting balance - Comments: seated balance poor requiring Max to Mod A to maintain seated balance Postural control: Right lateral lean                                 ADL either performed or assessed with clinical judgement   ADL Overall ADL's : Needs assistance/impaired Eating/Feeding: Supervision/ safety;Sitting Eating/Feeding Details (indicate cue type and reason): able to drink milk off tray after set up                 Lower Body Dressing: Maximal assistance;Bed level                      Extremity/Trunk Assessment              Vision  Perception     Praxis      Cognition Arousal: Alert Behavior During Therapy: WFL for tasks assessed/performed Overall Cognitive Status: Difficult to assess (is a bit less dysarthric today and easier to understand at times.)                                 General Comments: requires cueing and increased time for processing; easier to understand today        Exercises      Shoulder Instructions       General Comments      Pertinent Vitals/ Pain       Pain Assessment Pain Assessment: Faces Faces Pain Scale: Hurts little more Pain Location: R shoulder Pain Descriptors / Indicators: Aching, Sore Pain Intervention(s): Monitored during  session, Repositioned  Home Living                                          Prior Functioning/Environment              Frequency  Min 1X/week        Progress Toward Goals  OT Goals(current goals can now be found in the care plan section)  Progress towards OT goals: Progressing toward goals  Acute Rehab OT Goals Patient Stated Goal: improve strength OT Goal Formulation: With patient Time For Goal Achievement: 04/03/23 Potential to Achieve Goals: Good  Plan      Co-evaluation                 AM-PAC OT 6 Clicks Daily Activity     Outcome Measure   Help from another person eating meals?: None Help from another person taking care of personal grooming?: A Little Help from another person toileting, which includes using toliet, bedpan, or urinal?: A Lot Help from another person bathing (including washing, rinsing, drying)?: A Lot Help from another person to put on and taking off regular upper body clothing?: A Lot Help from another person to put on and taking off regular lower body clothing?: A Lot 6 Click Score: 15    End of Session    OT Visit Diagnosis: Other abnormalities of gait and mobility (R26.89);Unsteadiness on feet (R26.81);Muscle weakness (generalized) (M62.81);Hemiplegia and hemiparesis Hemiplegia - Right/Left: Left Hemiplegia - caused by: Cerebral infarction   Activity Tolerance Patient limited by lethargy   Patient Left in bed;with call bell/phone within reach;with bed alarm set   Nurse Communication          Time: 9154-9085 OT Time Calculation (min): 29 min  Charges: OT General Charges $OT Visit: 1 Visit OT Treatments $Therapeutic Activity: 23-37 mins  Letrell Attwood, OTR/L  03/24/23, 10:28 AM   Duwaine FORBES Saupe 03/24/2023, 10:25 AM

## 2023-03-24 NOTE — Progress Notes (Signed)
 Progress Note   Patient: Hunter Bailey FMW:[id number] DOB: 28-Jun-1934 DOA: 03/18/2023     6 DOS: the patient was seen and examined on 03/24/2023   Brief hospital course: 89 year old gentleman with past medical history significant for HLD, HTN, CAD, BPH, H/o CVA w/ left hemiparesis.  Patient resides in independent living facility.  He was brought in with decreased p.o. liquid intake for the past 3 days. He was lethargic on admission.    In the ER patient's vital stable.  Sodium 130, potassium 3.3, creatinine 2.67 baseline normal, total protein greater than 12, hemoglobin 9.7 [baseline 11.8, done 12/09/2022], platelets 114 [baseline normal]. Calcium  elevated to 13.  EKG: Atrial fibrillation HR 66, QTc 468.  Assessment and Plan: Hypercalcemia  AKI Acute anemia High suspicion for multiple myeloma Hypercalcemia possibly due to plasma cell disorder. Oncology evaluation is appreciated.  LDH, beta-2 microglobulin level ordered.  Elevated kappa light chain with increased light chain ratio. Plan for bone marrow biopsy after family consent. Protein >12 -protein electrophoresis pending. His kidney function worse than baseline but stable. Patient is weak, eating poor hemoglobin around 7.6.  No active bleeding.   Hypokalemia  Potassium improved status post repletion. Continue to monitor daily electrolytes   FTT In the setting of plasma cell disorder, possible multiple myeloma PT recommended SNF. Patient is SNF placement as per TOC.   Thrombocytopenia  Platelets lower side but stable. Monitor CBC closely and transfuse as indicated   CAD Continue atorvastatin .  Continue metoprolol     Hyperlipidemia History of CVA with left-sided hemiparesis Continue aspirin , Atorvastatin .     Out of bed to chair. Incentive spirometry. Nursing supportive care. Fall, aspiration precautions. DVT prophylaxis   Code Status: Full Code  Subjective: Patient is seen and examined today morning. He is weak.   Did complain of pain all over.  Eating poor.  Calcium  level down to 10.4  Physical Exam: Vitals:   03/23/23 1943 03/24/23 0504 03/24/23 0847 03/24/23 1554  BP: (!) 152/79 133/66 133/65 138/70  Pulse: 65 66 65 66  Resp: 20 (!) 21  16  Temp: 97.8 F (36.6 C) 97.8 F (36.6 C) 98 F (36.7 C) 97.9 F (36.6 C)  TempSrc: Oral     SpO2: 98% 98% 96% 98%  Weight:      Height:        General - Elderly weak African American male, no apparent distress HEENT - PERRLA, EOMI, atraumatic head, non tender sinuses. Lung - Clear, basal rales, no rhonchi, wheezes. Heart - S1, S2 heard, no murmurs, rubs, trace pedal edema. Abdomen - Soft, non tender, bowel sounds good Neuro - Alert, awake and oriented, non focal exam. Skin - Warm and dry.  Data Reviewed:      Latest Ref Rng & Units 03/24/2023    4:12 AM 03/23/2023    4:07 AM 03/22/2023    4:43 AM  CBC  WBC 4.0 - 10.5 K/uL 6.1  5.2  4.7   Hemoglobin 13.0 - 17.0 g/dL 7.7  7.5  7.6   Hematocrit 39.0 - 52.0 % 23.8  22.9  23.4   Platelets 150 - 400 K/uL 83  86  80       Latest Ref Rng & Units 03/24/2023    4:12 AM 03/23/2023    4:07 AM 03/22/2023    4:43 AM  BMP  Glucose 70 - 99 mg/dL 896  893  88   BUN 8 - 23 mg/dL 42  40  39   Creatinine 0.61 -  1.24 mg/dL 7.57  7.48  7.69   Sodium 135 - 145 mmol/L 135  137  137   Potassium 3.5 - 5.1 mmol/L 4.0  3.5  3.9   Chloride 98 - 111 mmol/L 110  110  110   CO2 22 - 32 mmol/L 22  23  24    Calcium  8.9 - 10.3 mg/dL 89.5  89.4  89.3    DG Bone Survey Met Result Date: 03/24/2023 CLINICAL DATA:  Elevated immunoglobulin free light chain level. Hypercalcemia. EXAM: METASTATIC BONE SURVEY COMPARISON:  CT abdomen pelvis dated August 16, 2021. Chest x-ray dated June 09, 2021. FINDINGS: No sclerotic or lytic lesion in the visualized axial or appendicular skeleton. Degenerative changes of the spine, shoulders, hips, and knees. IMPRESSION: 1. No evident bony lesion. Electronically Signed   By: Elsie ONEIDA Shoulder M.D.   On:  03/24/2023 16:55   US  RENAL Result Date: 03/23/2023 CLINICAL DATA:  Acute kidney injury EXAM: RENAL / URINARY TRACT ULTRASOUND COMPLETE COMPARISON:  Ultrasound 2015.  CT renal stone 08/16/2021. FINDINGS: Right Kidney: Renal measurements: 11.7 x 6.0 x 5.9 cm = volume: 215 mL. No collecting system dilatation or perinephric fluid. Upper pole renal cyst measuring 2.9 x 3.0 x 3.0 cm. Two thousand fifteen this has smaller at 15 mm. This has seen on prior CT as well. Left Kidney: Imaging of the left kidney. Patient declined additional images as per the sonographer of the left kidney due to pain. Bladder: Underdistended. Diffuse wall thickening. Large prostate. Prostate was not able to be measured by the sonographer at this time. Ureteral jets were seen. Other: None. IMPRESSION: Limited examination as the patient did not want to continue due to pain. Left kidney is not imaged. No right-sided renal collecting system dilatation. Persistent upper pole right-sided renal cysts. Enlarged prostate with mass effect along the base of the bladder. Bladder wall thickening and trabeculation. Electronically Signed   By: Ranell Bring M.D.   On: 03/23/2023 16:59     Family Communication: Discussed with niece yesterday, she understand and agree. All questions answereed.  Disposition: Status is: Inpatient Remains inpatient appropriate because: hypercalcemia work up. Oncology follow-up, possible bone marrow biopsy  Planned Discharge Destination: Skilled nursing facility     Time spent: 37 minutes  Author: Concepcion Riser, MD 03/24/2023 5:11 PM Secure chat 7am to 7pm For on call review [email or web address]

## 2023-03-25 DIAGNOSIS — D6481 Anemia due to antineoplastic chemotherapy: Secondary | ICD-10-CM | POA: Diagnosis not present

## 2023-03-25 DIAGNOSIS — N179 Acute kidney failure, unspecified: Secondary | ICD-10-CM | POA: Diagnosis not present

## 2023-03-25 DIAGNOSIS — G8194 Hemiplegia, unspecified affecting left nondominant side: Secondary | ICD-10-CM | POA: Diagnosis not present

## 2023-03-25 LAB — COMPREHENSIVE METABOLIC PANEL
ALT: 17 U/L (ref 0–44)
AST: 18 U/L (ref 15–41)
Albumin: 2.2 g/dL — ABNORMAL LOW (ref 3.5–5.0)
Alkaline Phosphatase: 31 U/L — ABNORMAL LOW (ref 38–126)
Anion gap: 2 — ABNORMAL LOW (ref 5–15)
BUN: 45 mg/dL — ABNORMAL HIGH (ref 8–23)
CO2: 24 mmol/L (ref 22–32)
Calcium: 10.9 mg/dL — ABNORMAL HIGH (ref 8.9–10.3)
Chloride: 108 mmol/L (ref 98–111)
Creatinine, Ser: 2.47 mg/dL — ABNORMAL HIGH (ref 0.61–1.24)
GFR, Estimated: 25 mL/min — ABNORMAL LOW (ref >60)
Glucose, Bld: 90 mg/dL (ref 70–99)
Potassium: 3.9 mmol/L (ref 3.5–5.1)
Sodium: 134 mmol/L — ABNORMAL LOW (ref 135–145)
Total Bilirubin: 0.7 mg/dL (ref 0.0–1.2)
Total Protein: >12 g/dL — ABNORMAL HIGH (ref 6.5–8.1)

## 2023-03-25 LAB — CBC
HCT: 21.9 % — ABNORMAL LOW (ref 39.0–52.0)
Hemoglobin: 7.2 g/dL — ABNORMAL LOW (ref 13.0–17.0)
MCH: 31.2 pg (ref 26.0–34.0)
MCHC: 32.9 g/dL (ref 30.0–36.0)
MCV: 94.8 fL (ref 80.0–100.0)
Platelets: 97 10*3/uL — ABNORMAL LOW (ref 150–400)
RBC: 2.31 MIL/uL — ABNORMAL LOW (ref 4.22–5.81)
RDW: 17.2 % — ABNORMAL HIGH (ref 11.5–15.5)
WBC: 6.8 10*3/uL (ref 4.0–10.5)
nRBC: 0 % (ref 0.0–0.2)

## 2023-03-25 LAB — MAGNESIUM: Magnesium: 2.1 mg/dL (ref 1.7–2.4)

## 2023-03-25 LAB — LACTATE DEHYDROGENASE: LDH: 124 U/L (ref 98–192)

## 2023-03-25 LAB — PHOSPHORUS: Phosphorus: 2.9 mg/dL (ref 2.5–4.6)

## 2023-03-25 MED ORDER — FLUCONAZOLE 100 MG PO TABS
200.0000 mg | ORAL_TABLET | Freq: Every day | ORAL | Status: AC
  Administered 2023-03-25 – 2023-03-26 (×2): 200 mg via ORAL
  Filled 2023-03-25 (×2): qty 2

## 2023-03-25 NOTE — Plan of Care (Signed)
 IR was requested for image guided BMBX for Monday 1/13.   Will call [facility] cytology on Monday to check availability.    Pt made NPO except meds Monday AM.  CBC with diff Monday AM.  No need to hold AC/AP Hgb trending down, 7.2 this morning, ideally hgb should be equal or higher than 7 for the procedure.   Formal consult to follow.   Please call IR for questions and concerns.   Tywaun Hiltner H Nillie Bartolotta PA-C 03/25/2023 10:52 AM

## 2023-03-25 NOTE — Progress Notes (Signed)
 Progress Note   Patient: Hunter Bailey FMW:[id number] DOB: [birth year] DOA: 03/18/2023     7 DOS: the patient was seen and examined on 03/25/2023   Brief hospital course: 89 year old gentleman with past medical history significant for HLD, HTN, CAD, BPH, H/o CVA w/ left hemiparesis.  Patient resides in independent living facility.  He was brought in with decreased p.o. liquid intake for the past 3 days. He was lethargic on admission.    In the ER patient's vital stable.  Sodium 130, potassium 3.3, creatinine 2.67 baseline normal, total protein greater than 12, hemoglobin 9.7 [baseline 11.8, done 12/09/2022], platelets 114 [baseline normal]. Calcium  elevated to 13.  EKG: Atrial fibrillation HR 66, QTc 468.  Assessment and Plan: Hypercalcemia  AKI Acute anemia High suspicion for multiple myeloma Hypercalcemia possibly due to plasma cell disorder. Oncology evaluation is appreciated.  LDH 124, beta-2 microglobulin level ordered.  Elevated kappa light chain with increased light chain ratio. Bone marrow biopsy and aspiration ordered for 03/27/23. Protein >12 - protein electrophoresis pending. His kidney function worse than baseline but stable. Patient is weak, eating poor hemoglobin around 7.2.  No active bleeding.   Hypokalemia  Potassium improved status post repletion. Continue to monitor daily electrolytes   FTT In the setting of plasma cell disorder, possible multiple myeloma PT recommended SNF. Patient is SNF placement as per TOC.   Thrombocytopenia  Platelets lower side but stable. Monitor CBC closely and transfuse as indicated   CAD Continue atorvastatin .  Continue metoprolol     Hyperlipidemia History of CVA with left-sided hemiparesis Hold aspirin  for bone marrow bx, continue Atorvastatin .     Out of bed to chair. Incentive spirometry. Nursing supportive care. Fall, aspiration precautions. DVT prophylaxis   Code Status: Full Code  Subjective: Patient is seen and  examined today morning. He is sleeping, has weakness. Eating poor.  Calcium  level down to 10.9 today.  Physical Exam: Vitals:   03/24/23 2109 03/25/23 0442 03/25/23 0500 03/25/23 0732  BP: (!) 145/69 (!) 142/69  (!) 142/69  Pulse: 67 61  63  Resp: 16 20  16   Temp: 99 F (37.2 C) 97.8 F (36.6 C)  97.6 F (36.4 C)  TempSrc: Oral Oral    SpO2: 98% 99%  99%  Weight:   59.3 kg   Height:        General - Elderly weak African American male, no apparent distress HEENT - PERRLA, EOMI, atraumatic head, non tender sinuses. Lung - Clear, basal rales, no rhonchi, wheezes. Heart - S1, S2 heard, no murmurs, rubs, trace pedal edema. Abdomen - Soft, non tender, bowel sounds good Neuro - Alert, awake and oriented, non focal exam. Skin - Warm and dry.  Data Reviewed:      Latest Ref Rng & Units 03/25/2023    5:42 AM 03/24/2023    4:12 AM 03/23/2023    4:07 AM  CBC  WBC 4.0 - 10.5 K/uL 6.8  6.1  5.2   Hemoglobin 13.0 - 17.0 g/dL 7.2  7.7  7.5   Hematocrit 39.0 - 52.0 % 21.9  23.8  22.9   Platelets 150 - 400 K/uL 97  83  86       Latest Ref Rng & Units 03/25/2023    5:42 AM 03/24/2023    4:12 AM 03/23/2023    4:07 AM  BMP  Glucose 70 - 99 mg/dL 90  896  893   BUN 8 - 23 mg/dL 45  42  40   Creatinine  0.61 - 1.24 mg/dL 7.52  7.57  7.48   Sodium 135 - 145 mmol/L 134  135  137   Potassium 3.5 - 5.1 mmol/L 3.9  4.0  3.5   Chloride 98 - 111 mmol/L 108  110  110   CO2 22 - 32 mmol/L 24  22  23    Calcium  8.9 - 10.3 mg/dL 89.0  89.5  89.4    DG Bone Survey Met Result Date: 03/24/2023 CLINICAL DATA:  Elevated immunoglobulin free light chain level. Hypercalcemia. EXAM: METASTATIC BONE SURVEY COMPARISON:  CT abdomen pelvis dated August 16, 2021. Chest x-ray dated June 09, 2021. FINDINGS: No sclerotic or lytic lesion in the visualized axial or appendicular skeleton. Degenerative changes of the spine, shoulders, hips, and knees. IMPRESSION: 1. No evident bony lesion. Electronically Signed   By: Elsie ONEIDA Shoulder M.D.   On: 03/24/2023 16:55   US  RENAL Result Date: 03/23/2023 CLINICAL DATA:  Acute kidney injury EXAM: RENAL / URINARY TRACT ULTRASOUND COMPLETE COMPARISON:  Ultrasound 2015.  CT renal stone 08/16/2021. FINDINGS: Right Kidney: Renal measurements: 11.7 x 6.0 x 5.9 cm = volume: 215 mL. No collecting system dilatation or perinephric fluid. Upper pole renal cyst measuring 2.9 x 3.0 x 3.0 cm. Two thousand fifteen this has smaller at 15 mm. This has seen on prior CT as well. Left Kidney: Imaging of the left kidney. Patient declined additional images as per the sonographer of the left kidney due to pain. Bladder: Underdistended. Diffuse wall thickening. Large prostate. Prostate was not able to be measured by the sonographer at this time. Ureteral jets were seen. Other: None. IMPRESSION: Limited examination as the patient did not want to continue due to pain. Left kidney is not imaged. No right-sided renal collecting system dilatation. Persistent upper pole right-sided renal cysts. Enlarged prostate with mass effect along the base of the bladder. Bladder wall thickening and trabeculation. Electronically Signed   By: Ranell Bring M.D.   On: 03/23/2023 16:59     Family Communication: Discussed with patient, he understand and agree. All questions answereed.  Disposition: Status is: Inpatient Remains inpatient appropriate because: hypercalcemia work up, bone marrow biopsy Monday.  Planned Discharge Destination: Skilled nursing facility     Time spent: 37 minutes  Author: Concepcion Riser, MD 03/25/2023 2:44 PM Secure chat 7am to 7pm For on call review [email or web address]

## 2023-03-26 DIAGNOSIS — G8194 Hemiplegia, unspecified affecting left nondominant side: Secondary | ICD-10-CM | POA: Diagnosis not present

## 2023-03-26 DIAGNOSIS — N179 Acute kidney failure, unspecified: Secondary | ICD-10-CM | POA: Diagnosis not present

## 2023-03-26 DIAGNOSIS — D6481 Anemia due to antineoplastic chemotherapy: Secondary | ICD-10-CM | POA: Diagnosis not present

## 2023-03-26 LAB — COMPREHENSIVE METABOLIC PANEL
ALT: 15 U/L (ref 0–44)
AST: 16 U/L (ref 15–41)
Albumin: 2.3 g/dL — ABNORMAL LOW (ref 3.5–5.0)
Alkaline Phosphatase: 31 U/L — ABNORMAL LOW (ref 38–126)
Anion gap: 4 — ABNORMAL LOW (ref 5–15)
BUN: 49 mg/dL — ABNORMAL HIGH (ref 8–23)
CO2: 22 mmol/L (ref 22–32)
Calcium: 11 mg/dL — ABNORMAL HIGH (ref 8.9–10.3)
Chloride: 106 mmol/L (ref 98–111)
Creatinine, Ser: 2.67 mg/dL — ABNORMAL HIGH (ref 0.61–1.24)
GFR, Estimated: 23 mL/min — ABNORMAL LOW (ref >60)
Glucose, Bld: 106 mg/dL — ABNORMAL HIGH (ref 70–99)
Potassium: 3.7 mmol/L (ref 3.5–5.1)
Sodium: 132 mmol/L — ABNORMAL LOW (ref 135–145)
Total Bilirubin: 0.7 mg/dL (ref 0.0–1.2)
Total Protein: >12 g/dL — ABNORMAL HIGH (ref 6.5–8.1)

## 2023-03-26 LAB — MAGNESIUM: Magnesium: 2.3 mg/dL (ref 1.7–2.4)

## 2023-03-26 LAB — CBC
HCT: 23.5 % — ABNORMAL LOW (ref 39.0–52.0)
Hemoglobin: 7.6 g/dL — ABNORMAL LOW (ref 13.0–17.0)
MCH: 30.8 pg (ref 26.0–34.0)
MCHC: 32.3 g/dL (ref 30.0–36.0)
MCV: 95.1 fL (ref 80.0–100.0)
Platelets: 100 10*3/uL — ABNORMAL LOW (ref 150–400)
RBC: 2.47 MIL/uL — ABNORMAL LOW (ref 4.22–5.81)
RDW: 17.2 % — ABNORMAL HIGH (ref 11.5–15.5)
WBC: 7.4 10*3/uL (ref 4.0–10.5)
nRBC: 0.3 % — ABNORMAL HIGH (ref 0.0–0.2)

## 2023-03-26 LAB — PHOSPHORUS: Phosphorus: 2.9 mg/dL (ref 2.5–4.6)

## 2023-03-26 NOTE — Plan of Care (Signed)
  Problem: Education: Goal: Knowledge of General Education information will improve Description: Including pain rating scale, medication(s)/side effects and non-pharmacologic comfort measures Outcome: Progressing   Problem: Health Behavior/Discharge Planning: Goal: Ability to manage health-related needs will improve Outcome: Progressing   Problem: Clinical Measurements: Goal: Ability to maintain clinical measurements within normal limits will improve Outcome: Progressing Goal: Will remain free from infection Outcome: Progressing Goal: Diagnostic test results will improve Outcome: Progressing Goal: Respiratory complications will improve Outcome: Progressing Goal: Cardiovascular complication will be avoided Outcome: Progressing   Problem: Activity: Goal: Risk for activity intolerance will decrease Outcome: Progressing   Problem: Nutrition: Goal: Adequate nutrition will be maintained Outcome: Not Progressing

## 2023-03-26 NOTE — Progress Notes (Signed)
 Physical Therapy Treatment Patient Details Name: Hunter Bailey MRN: [id number] DOB: 06/28/1934 Today's Date: 03/26/2023   History of Present Illness Hunter Bailey is an 73yoM who comes to [facility] on 03/18/23 after 3 days of decreased PO intake, pt now lethargic and appears dehydrated. Pt with elevated Ca++: 13. Pt resides at the [place] of [address] PMH: CVA c Lt hemiplegia.    PT Comments  Awake upon arrival.  Transitions to EOB with max a x 1.  He is unable to maintain balance in sitting without mod a x 1.  Leans right and post.  He does sit about 4 minutes before self initiating return to supine.  Speech is garbled and and unable to understand.  Upon return to supine he drifts back to sleep.  Attempted supine AAROM but pt does not assist and further intervention deferred.    If plan is discharge home, recommend the following: Two people to help with walking and/or transfers;Direct supervision/assist for medications management;Direct supervision/assist for financial management;Assist for transportation;Assistance with cooking/housework   Can travel by private vehicle        Equipment Recommendations       Recommendations for Other Services       Precautions / Restrictions Precautions Precautions: Fall Restrictions Weight Bearing Restrictions Per Provider Order: No     Mobility  Bed Mobility Overal bed mobility: Needs Assistance Bed Mobility: Supine to Sit, Sit to Supine     Supine to sit: Max assist, Mod assist, Used rails, HOB elevated Sit to supine: Max assist        Transfers                   General transfer comment: deferred d/t poor seated balance    Ambulation/Gait                   Stairs             Wheelchair Mobility     Tilt Bed    Modified Rankin (Stroke Patients Only)       Balance Overall balance assessment: Needs assistance Sitting-balance support: Feet supported, Single extremity supported Sitting balance-Leahy Scale:  Poor Sitting balance - Comments: seated balance poor requiring Max to Mod A to maintain seated balance Postural control: Right lateral lean                                  Cognition Arousal: Lethargic Behavior During Therapy: WFL for tasks assessed/performed Overall Cognitive Status: Difficult to assess                                 General Comments: garbles speach        Exercises Other Exercises Other Exercises: sat EOB about 4 minutes before initiation back to supine.  supine PROM -  pt did not attempt to assist as he is drifting back to sleep.    General Comments        Pertinent Vitals/Pain Pain Assessment Pain Assessment: No/denies pain    Home Living                          Prior Function            PT Goals (current goals can now be found in the care plan section) Progress towards PT goals: Not progressing  toward goals - comment    Frequency    Min 1X/week      PT Plan      Co-evaluation              AM-PAC PT 6 Clicks Mobility   Outcome Measure  Help needed turning from your back to your side while in a flat bed without using bedrails?: A Lot Help needed moving from lying on your back to sitting on the side of a flat bed without using bedrails?: Total Help needed moving to and from a bed to a chair (including a wheelchair)?: Total Help needed standing up from a chair using your arms (e.g., wheelchair or bedside chair)?: Total Help needed to walk in hospital room?: Total Help needed climbing 3-5 steps with a railing? : Total 6 Click Score: 7    End of Session   Activity Tolerance: Patient limited by lethargy Patient left: in bed;with call bell/phone within reach;with bed alarm set Nurse Communication: Mobility status PT Visit Diagnosis: Other abnormalities of gait and mobility (R26.89);Unsteadiness on feet (R26.81);Muscle weakness (generalized) (M62.81)     Time: 9063-9054 PT Time  Calculation (min) (ACUTE ONLY): 9 min  Charges:      PT General Charges $$ ACUTE PT VISIT: 1 Visit                   Lauraine Gills, PTA 03/26/23, 10:05 AM

## 2023-03-26 NOTE — Progress Notes (Signed)
 Progress Note   Patient: Hunter Bailey FMW:[id number] DOB: 1934-08-13 DOA: 03/18/2023     8 DOS: the patient was seen and examined on 03/26/2023   Brief hospital course: 89 year old gentleman with past medical history significant for HLD, HTN, CAD, BPH, H/o CVA w/ left hemiparesis.  Patient resides in independent living facility.  He was brought in with decreased p.o. liquid intake for the past 3 days. He was lethargic on admission.    In the ER patient's vital stable.  Sodium 130, potassium 3.3, creatinine 2.67 baseline normal, total protein greater than 12, hemoglobin 9.7 [baseline 11.8, done 12/09/2022], platelets 114 [baseline normal]. Calcium  elevated to 13. EKG: Atrial fibrillation HR 66, QTc 468.   Patient is admitted for hypercalcemia, started on IV fluids.  Patient's protein is elevated at 12, kidney function worse than baseline.  Oncology evaluated the patient for suspicion of multiple myeloma.  Patient to go for bone marrow biopsy on Monday.  Assessment and Plan: Hypercalcemia  AKI Acute anemia High suspicion for multiple myeloma Hypercalcemia possibly due to plasma cell disorder. Oncology evaluation is appreciated.  LDH 124, beta-2 microglobulin level ordered.  Elevated kappa light chain with increased light chain ratio. Bone marrow biopsy and aspiration ordered for 03/27/23. Protein >12 - protein electrophoresis pending. His kidney function worse than baseline but stable.  Creatinine 2.67. Patient is weak, eating poor hemoglobin around 7.6.  No active bleeding.   Hypokalemia  Potassium improved status post repletion. Continue to monitor daily electrolytes   FTT In the setting of plasma cell disorder, possible multiple myeloma PT recommended SNF. Patient is SNF placement as per TOC.   Thrombocytopenia  Platelets lower side but stable.  Platelets 100 today. Monitor CBC closely and transfuse as indicated   CAD Continue atorvastatin .  Continue metoprolol      Hyperlipidemia History of CVA with left-sided hemiparesis Hold aspirin  for bone marrow bx, continue Atorvastatin .     Out of bed to chair. Incentive spirometry. Nursing supportive care. Fall, aspiration precautions. DVT prophylaxis   Code Status: Full Code  Subjective: Patient is seen and examined today morning. He is sleeping, comfortably, advised to work with PT.  Encouraged diet, supplements.  Calcium  11 today.  Physical Exam: Vitals:   03/25/23 0732 03/25/23 1553 03/25/23 2048 03/26/23 0425  BP: (!) 142/69 128/63 138/70 130/71  Pulse: 63 65 68 61  Resp: 16 18 16 18   Temp: 97.6 F (36.4 C) 97.7 F (36.5 C) 98.2 F (36.8 C) 97.8 F (36.6 C)  TempSrc:   Oral Oral  SpO2: 99% 99% 97% 98%  Weight:      Height:        General - Elderly weak African American male, no apparent distress HEENT - PERRLA, EOMI, atraumatic head, non tender sinuses. Lung - Clear, basal rales, no rhonchi, wheezes. Heart - S1, S2 heard, no murmurs, rubs, trace pedal edema. Abdomen - Soft, non tender, bowel sounds good Neuro - Alert, awake and oriented, non focal exam. Skin - Warm and dry.  Data Reviewed:      Latest Ref Rng & Units 03/26/2023    5:06 AM 03/25/2023    5:42 AM 03/24/2023    4:12 AM  CBC  WBC 4.0 - 10.5 K/uL 7.4  6.8  6.1   Hemoglobin 13.0 - 17.0 g/dL 7.6  7.2  7.7   Hematocrit 39.0 - 52.0 % 23.5  21.9  23.8   Platelets 150 - 400 K/uL 100  97  83  Latest Ref Rng & Units 03/26/2023    5:06 AM 03/25/2023    5:42 AM 03/24/2023    4:12 AM  BMP  Glucose 70 - 99 mg/dL 893  90  896   BUN 8 - 23 mg/dL 49  45  42   Creatinine 0.61 - 1.24 mg/dL 7.32  7.52  7.57   Sodium 135 - 145 mmol/L 132  134  135   Potassium 3.5 - 5.1 mmol/L 3.7  3.9  4.0   Chloride 98 - 111 mmol/L 106  108  110   CO2 22 - 32 mmol/L 22  24  22    Calcium  8.9 - 10.3 mg/dL 88.9  89.0  89.5    DG Bone Survey Met Result Date: 03/24/2023 CLINICAL DATA:  Elevated immunoglobulin free light chain level.  Hypercalcemia. EXAM: METASTATIC BONE SURVEY COMPARISON:  CT abdomen pelvis dated August 16, 2021. Chest x-ray dated June 09, 2021. FINDINGS: No sclerotic or lytic lesion in the visualized axial or appendicular skeleton. Degenerative changes of the spine, shoulders, hips, and knees. IMPRESSION: 1. No evident bony lesion. Electronically Signed   By: Elsie ONEIDA Shoulder M.D.   On: 03/24/2023 16:55   Family Communication: Discussed with patient, he understand and agree. All questions answereed.  Disposition: Status is: Inpatient Remains inpatient appropriate because: hypercalcemia work up, bone marrow biopsy Monday.  Planned Discharge Destination: Skilled nursing facility     Time spent: 36 minutes  Author: Concepcion Riser, MD 03/26/2023 2:57 PM Secure chat 7am to 7pm For on call review [email or web address].

## 2023-03-27 DIAGNOSIS — D6481 Anemia due to antineoplastic chemotherapy: Secondary | ICD-10-CM | POA: Diagnosis not present

## 2023-03-27 DIAGNOSIS — N179 Acute kidney failure, unspecified: Secondary | ICD-10-CM | POA: Diagnosis not present

## 2023-03-27 DIAGNOSIS — E119 Type 2 diabetes mellitus without complications: Secondary | ICD-10-CM

## 2023-03-27 DIAGNOSIS — E876 Hypokalemia: Secondary | ICD-10-CM | POA: Diagnosis not present

## 2023-03-27 LAB — CBC WITH DIFFERENTIAL/PLATELET
Abs Immature Granulocytes: 0.14 10*3/uL — ABNORMAL HIGH (ref 0.00–0.07)
Basophils Absolute: 0 10*3/uL (ref 0.0–0.1)
Basophils Relative: 0 %
Eosinophils Absolute: 0 10*3/uL (ref 0.0–0.5)
Eosinophils Relative: 0 %
HCT: 24.2 % — ABNORMAL LOW (ref 39.0–52.0)
Hemoglobin: 7.7 g/dL — ABNORMAL LOW (ref 13.0–17.0)
Immature Granulocytes: 2 %
Lymphocytes Relative: 26 %
Lymphs Abs: 1.5 10*3/uL (ref 0.7–4.0)
MCH: 30.8 pg (ref 26.0–34.0)
MCHC: 31.8 g/dL (ref 30.0–36.0)
MCV: 96.8 fL (ref 80.0–100.0)
Monocytes Absolute: 0.8 10*3/uL (ref 0.1–1.0)
Monocytes Relative: 13 %
Neutro Abs: 3.4 10*3/uL (ref 1.7–7.7)
Neutrophils Relative %: 59 %
Platelets: 127 10*3/uL — ABNORMAL LOW (ref 150–400)
RBC: 2.5 MIL/uL — ABNORMAL LOW (ref 4.22–5.81)
RDW: 17.3 % — ABNORMAL HIGH (ref 11.5–15.5)
Smear Review: NORMAL
WBC: 5.8 10*3/uL (ref 4.0–10.5)
nRBC: 0.5 % — ABNORMAL HIGH (ref 0.0–0.2)

## 2023-03-27 LAB — COMPREHENSIVE METABOLIC PANEL
ALT: 14 U/L (ref 0–44)
AST: 13 U/L — ABNORMAL LOW (ref 15–41)
Albumin: 2.3 g/dL — ABNORMAL LOW (ref 3.5–5.0)
Alkaline Phosphatase: 32 U/L — ABNORMAL LOW (ref 38–126)
Anion gap: 4 — ABNORMAL LOW (ref 5–15)
BUN: 52 mg/dL — ABNORMAL HIGH (ref 8–23)
CO2: 25 mmol/L (ref 22–32)
Calcium: 11.5 mg/dL — ABNORMAL HIGH (ref 8.9–10.3)
Chloride: 108 mmol/L (ref 98–111)
Creatinine, Ser: 2.95 mg/dL — ABNORMAL HIGH (ref 0.61–1.24)
GFR, Estimated: 20 mL/min — ABNORMAL LOW (ref >60)
Glucose, Bld: 111 mg/dL — ABNORMAL HIGH (ref 70–99)
Potassium: 3.7 mmol/L (ref 3.5–5.1)
Sodium: 137 mmol/L (ref 135–145)
Total Bilirubin: 0.8 mg/dL (ref 0.0–1.2)
Total Protein: >12 g/dL — ABNORMAL HIGH (ref 6.5–8.1)

## 2023-03-27 LAB — MAGNESIUM: Magnesium: 2.5 mg/dL — ABNORMAL HIGH (ref 1.7–2.4)

## 2023-03-27 LAB — PROTIME-INR
INR: 1.4 — ABNORMAL HIGH (ref 0.8–1.2)
Prothrombin Time: 17.3 s — ABNORMAL HIGH (ref 11.4–15.2)

## 2023-03-27 LAB — PHOSPHORUS: Phosphorus: 3.6 mg/dL (ref 2.5–4.6)

## 2023-03-27 MED ORDER — LACTATED RINGERS IV SOLN: INTRAVENOUS | Status: DC

## 2023-03-27 NOTE — Consult Note (Signed)
 Chief Complaint: Patient was seen in consultation today for  bone marrow biopsy  Referring Physician(s): Dr. Darci  Supervising Physician: Karalee Beat  Patient Status: [facility] - In-pt  History of Present Illness: Hunter Bailey is a 89 y.o. male admitted with FTT. He is also found to have AKI and hypercalcemia. There is concern for underlying myeloma. He is undergoing further workup. IR is asked to eval for image guided bone marrow biopsy.  PMHx, meds, labs, imaging, allergies reviewed. Spoke with [name] Porcher, pt niece and medical POA via phone.    Past Medical History:  Diagnosis Date   Angina of effort (HCC)    BPH (benign prostatic hyperplasia)    Coronary artery disease    Encephalopathy    Hemiparesis (HCC)    Hyperlipidemia    Hypertension    MI (myocardial infarction) (HCC)    Stroke [facility])     Past Surgical History:  Procedure Laterality Date   CORONARY/GRAFT ACUTE MI REVASCULARIZATION N/A 06/09/2021   Procedure: Coronary/Graft Acute MI Revascularization;  Surgeon: Ammon Blunt, MD;  Location: [facility] INVASIVE CV LAB;  Service: Cardiovascular;  Laterality: N/A;   LEFT HEART CATH AND CORONARY ANGIOGRAPHY N/A 06/09/2021   Procedure: LEFT HEART CATH AND CORONARY ANGIOGRAPHY;  Surgeon: Ammon Blunt, MD;  Location: [facility] INVASIVE CV LAB;  Service: Cardiovascular;  Laterality: N/A;    Allergies: Penicillins  Medications:  Current Facility-Administered Medications:    acetaminophen  (TYLENOL ) tablet 650 mg, 650 mg, Oral, Q6H PRN **OR** acetaminophen  (TYLENOL ) suppository 650 mg, 650 mg, Rectal, Q6H PRN, Crosley, Debby, MD   alum & mag hydroxide-simeth (MAALOX/MYLANTA) 200-200-20 MG/5ML suspension 30 mL, 30 mL, Oral, Q6H PRN, Sreeram, Narendranath, MD   atorvastatin  (LIPITOR ) tablet 20 mg, 20 mg, Oral, Daily, Djan, Prince T, MD, 20 mg at 03/26/23 0906   Chlorhexidine  Gluconate Cloth 2 % PADS 6 each, 6 each, Topical, Q0600, Sreeram, Narendranath,  MD, 6 each at 03/27/23 0432   cyanocobalamin  (VITAMIN B12) tablet 1,000 mcg, 1,000 mcg, Oral, Daily, Yu, Zhou, MD, 1,000 mcg at 03/26/23 0906   feeding supplement (ENSURE ENLIVE / ENSURE PLUS) liquid 237 mL, 237 mL, Oral, TID BM, Sreeram, Narendranath, MD, 237 mL at 03/26/23 2155   heparin  injection 5,000 Units, 5,000 Units, Subcutaneous, Q8H, Crosley, Debby, MD, 5,000 Units at 03/27/23 9491   lactated ringers  infusion, , Intravenous, Continuous, Darci Pore, MD, Last Rate: 75 mL/hr at 03/27/23 1322, New Bag at 03/27/23 1322   latanoprost  (XALATAN ) 0.005 % ophthalmic solution 1 drop, 1 drop, Both Eyes, QHS, Sreeram, Narendranath, MD, 1 drop at 03/26/23 2218   metoprolol  tartrate (LOPRESSOR ) injection 5 mg, 5 mg, Intravenous, Q4H PRN, Crosley, Debby, MD   mupirocin  ointment (BACTROBAN ) 2 % 1 Application, 1 Application, Nasal, BID, Sreeram, Narendranath, MD, 1 Application at 03/27/23 1007   ondansetron  (ZOFRAN ) tablet 4 mg, 4 mg, Oral, Q6H PRN **OR** ondansetron  (ZOFRAN ) injection 4 mg, 4 mg, Intravenous, Q6H PRN, Crosley, Debby, MD   timolol  (TIMOPTIC ) 0.5 % ophthalmic solution 1 drop, 1 drop, Both Eyes, BID, Sreeram, Narendranath, MD, 1 drop at 03/27/23 1008  Facility-Administered Medications Ordered in Other Encounters:    clopidogrel  (PLAVIX ) tablet, , , PRN, Paraschos, Alexander, MD, 600 mg at 06/09/21 2049    History reviewed. No pertinent family history.  Social History   Socioeconomic History   Marital status: Divorced    Spouse name: Not on file   Number of children: Not on file   Years of education: Not on file   Highest education  level: Not on file  Occupational History   Not on file  Tobacco Use   Smoking status: Former    Current packs/day: 0.00    Types: Cigarettes    Quit date: 2020    Years since quitting: 5.0    Passive exposure: Past   Smokeless tobacco: Never  Vaping Use   Vaping status: Never Used  Substance and Sexual Activity   Alcohol  use: Not  Currently   Drug use: Not Currently   Sexual activity: Not Currently  Other Topics Concern   Not on file  Social History Narrative   Not on file   Social Drivers of Health   Financial Resource Strain: Not on file  Food Insecurity: Patient Unable To Answer (03/22/2023)   Hunger Vital Sign    Worried About Running Out of Food in the Last Year: Patient unable to answer    Ran Out of Food in the Last Year: Patient unable to answer  Transportation Needs: Patient Unable To Answer (03/22/2023)   PRAPARE - Transportation    Lack of Transportation (Medical): Patient unable to answer    Lack of Transportation (Non-Medical): Patient unable to answer  Physical Activity: Not on file  Stress: Not on file  Social Connections: Unknown (03/22/2023)   Social Connection and Isolation Panel [NHANES]    Frequency of Communication with Friends and Family: Patient unable to answer    Frequency of Social Gatherings with Friends and Family: Patient unable to answer    Attends Religious Services: Patient declined    Active Member of Clubs or Organizations: Patient declined    Attends Banker Meetings: Patient unable to answer    Marital Status: Patient unable to answer    Review of Systems: A 12 point ROS discussed and pertinent positives are indicated in the HPI above.  All other systems are negative.  Review of Systems  Vital Signs: BP (!) 153/75 (BP Location: Left Arm)   Pulse (!) 59   Temp 98.3 F (36.8 C) (Oral)   Resp 18   Ht 5' 8 (1.727 m)   Wt 131 lb 6.3 oz (59.6 kg)   SpO2 95%   BMI 19.98 kg/m   Physical Exam Constitutional:      Appearance: He is ill-appearing.     Comments: Pt lethargic and sleepy, difficult to arouse and have conversation with this afternoon.  HENT:     Mouth/Throat:     Mouth: Mucous membranes are moist.     Pharynx: Oropharynx is clear.  Cardiovascular:     Rate and Rhythm: Normal rate and regular rhythm.     Heart sounds: Normal heart sounds.   Pulmonary:     Effort: Pulmonary effort is normal. No respiratory distress.     Breath sounds: Normal breath sounds.  Neurological:     Comments: Sleepy, could not carry on discussion     Imaging: DG Bone Survey Met Result Date: 03/24/2023 CLINICAL DATA:  Elevated immunoglobulin free light chain level. Hypercalcemia. EXAM: METASTATIC BONE SURVEY COMPARISON:  CT abdomen pelvis dated August 16, 2021. Chest x-ray dated June 09, 2021. FINDINGS: No sclerotic or lytic lesion in the visualized axial or appendicular skeleton. Degenerative changes of the spine, shoulders, hips, and knees. IMPRESSION: 1. No evident bony lesion. Electronically Signed   By: Elsie ONEIDA Shoulder M.D.   On: 03/24/2023 16:55   US  RENAL Result Date: 03/23/2023 CLINICAL DATA:  Acute kidney injury EXAM: RENAL / URINARY TRACT ULTRASOUND COMPLETE COMPARISON:  Ultrasound 2015.  CT  renal stone 08/16/2021. FINDINGS: Right Kidney: Renal measurements: 11.7 x 6.0 x 5.9 cm = volume: 215 mL. No collecting system dilatation or perinephric fluid. Upper pole renal cyst measuring 2.9 x 3.0 x 3.0 cm. Two thousand fifteen this has smaller at 15 mm. This has seen on prior CT as well. Left Kidney: Imaging of the left kidney. Patient declined additional images as per the sonographer of the left kidney due to pain. Bladder: Underdistended. Diffuse wall thickening. Large prostate. Prostate was not able to be measured by the sonographer at this time. Ureteral jets were seen. Other: None. IMPRESSION: Limited examination as the patient did not want to continue due to pain. Left kidney is not imaged. No right-sided renal collecting system dilatation. Persistent upper pole right-sided renal cysts. Enlarged prostate with mass effect along the base of the bladder. Bladder wall thickening and trabeculation. Electronically Signed   By: Ranell Bring M.D.   On: 03/23/2023 16:59   DG Swallowing Func-Speech Pathology Result Date: 03/21/2023 Table formatting from the original  result was not included. Modified Barium Swallow Study Patient Details Name: Hunter Bailey MRN: [id number] Date of Birth: 1934/09/26 Today's Date: 03/21/2023 HPI/PMH: HPI: Pt is an 88yoM who [facility] after 3 days of decreased PO intake, pt now lethargic and appears dehydrated. Pt with elevated Ca++ 13.  Pt resides at the [place] of [address] PMH: CVA w/ Left hemiplegia.   CXR: No active disease.  Of Note: Pt indicated he had been being tx'd for UTI problems at the NH in the past month. Clinical Impression: Clinical Impression: Pt presents with moderate oropharyngeal dysphagia that places him at a high risk of aspiration when consuming thin liquids and nectar thick liquids via cup sips.     Pt's oral phase is c/b repetitive tongue movements during bolus propulsion and overall decreased mastication observed.     Pt's pharyngeal phase is c/b severely delayed swallow initiation with boluses resting in the pyriform sinuses for > 5 seconds. When consuming cup sips of thin liquids and nectar thick liquids, boluses spill into airway d/t larger bolus size and inability to contain them within his pyriform sinuses. Aspiration is reduced when consuming thin liquids and nectar thick liquids via spoon.     Given the above, recommend dysphagia 1 diet with thin liquids via spoon, medicine crushed in applesause with strict adherence to supervision and aspiration precautions. Factors that may increase risk of adverse event in presence of aspiration [name] & [name] 2021): Factors that may increase risk of adverse event in presence of aspiration [name] & [name] 2021): Reduced cognitive function; Limited mobility; Dependence for feeding and/or oral hygiene Recommendations/Plan: Swallowing Evaluation Recommendations Swallowing Evaluation Recommendations Recommendations: PO diet PO Diet Recommendation: Dysphagia 1 (Pureed); Thin liquids (Level 0) Liquid Administration via: Spoon Medication Administration: Crushed with  puree Supervision: Staff to assist with self-feeding; Full supervision/cueing for swallowing strategies Swallowing strategies  : Minimize environmental distractions; Slow rate; Small bites/sips Postural changes: Position pt fully upright for meals; Stay upright 30-60 min after meals Oral care recommendations: Oral care BID (2x/day) Caregiver Recommendations: -- (TBD) Treatment Plan Treatment Plan Treatment recommendations: No treatment recommended at this time Follow-up recommendations: No SLP follow up Functional status assessment: Patient has had a recent decline in their functional status and/or demonstrates limited ability to make significant improvements in function in a reasonable and predictable amount of time. Recommendations Recommendations for follow up therapy are one component of a multi-disciplinary discharge planning process, led by the attending  physician.  Recommendations may be updated based on patient status, additional functional criteria and insurance authorization. Assessment: Orofacial Exam: Orofacial Exam Oral Cavity: Oral Hygiene: WFL Oral Cavity - Dentition: Poor condition; Missing dentition Orofacial Anatomy: WFL Oral Motor/Sensory Function: Unable to test (left side facial weakness) Anatomy: Anatomy: WFL; Suspected cervical osteophytes Boluses Administered: Boluses Administered Boluses Administered: Thin liquids (Level 0); Mildly thick liquids (Level 2, nectar thick); Puree  Oral Impairment Domain: Oral Impairment Domain Lip Closure: Escape beyond mid-chin (on left) Tongue control during bolus hold: Not tested Bolus preparation/mastication: Minimal chewing/mashing with majority of bolus unchewed Bolus transport/lingual motion: Repetitive/disorganized tongue motion Oral residue: Trace residue lining oral structures Location of oral residue : Floor of mouth; Tongue; Palate Initiation of pharyngeal swallow : Pyriform sinuses  Pharyngeal Impairment Domain: Pharyngeal Impairment Domain Soft  palate elevation: No bolus between soft palate (SP)/pharyngeal wall (PW) Laryngeal elevation: Partial superior movement of thyroid  cartilage/partial approximation of arytenoids to epiglottic petiole Anterior hyoid excursion: Partial anterior movement Epiglottic movement: Complete inversion Laryngeal vestibule closure: Incomplete, narrow column air/contrast in laryngeal vestibule Pharyngeal stripping wave : Present - complete Pharyngeal contraction (A/P view only): Complete Pharyngoesophageal segment opening: Complete distension and complete duration, no obstruction of flow Tongue base retraction: No contrast between tongue base and posterior pharyngeal wall (PPW) Pharyngeal residue: Complete pharyngeal clearance  Esophageal Impairment Domain: Esophageal Impairment Domain Esophageal clearance upright position: Complete clearance, esophageal coating Pill: No data recorded Penetration/Aspiration Scale Score: Penetration/Aspiration Scale Score 1.  Material does not enter airway: Thin liquids (Level 0); Mildly thick liquids (Level 2, nectar thick); Puree 8.  Material enters airway, passes BELOW cords without attempt by patient to eject out (silent aspiration) : Thin liquids (Level 0); Mildly thick liquids (Level 2, nectar thick) (vis cup sips) Compensatory Strategies: Compensatory Strategies Compensatory strategies: No   General Information: Caregiver present: No  Diet Prior to this Study: NPO   Temperature : Normal   Respiratory Status: WFL   Supplemental O2: None (Room air)   History of Recent Intubation: No  Behavior/Cognition: Alert; Distractible; Requires cueing; Doesn't follow directions Self-Feeding Abilities: Needs set-up for self-feeding; Needs assist with self-feeding; Needs hand-over-hand assist for feeding Baseline vocal quality/speech: Normal Volitional Cough: Unable to elicit Volitional Swallow: Unable to elicit Exam Limitations: No limitations Goal Planning: Prognosis for improved oropharyngeal function:  Fair Barriers to Reach Goals: Cognitive deficits; Time post onset; Severity of deficits Barriers/Prognosis Comment: old R CVA; missing/poor Dentition; LUE weakness Patient/Family Stated Goal: none stated Consulted and agree with results and recommendations: Patient; Physician; Nurse Pain: Pain Assessment Pain Assessment: Faces Faces Pain Scale: 4 Pain Location: R shoulder Pain Descriptors / Indicators: Aching; Sore Pain Intervention(s): Monitored during session; Repositioned End of Session: Start Time:SLP Start Time (ACUTE ONLY): 1135 Stop Time: SLP Stop Time (ACUTE ONLY): 1155 Time Calculation:SLP Time Calculation (min) (ACUTE ONLY): 20 min Charges: SLP Evaluations $ SLP Speech Visit: 1 Visit SLP Evaluations $BSS Swallow: 1 Procedure $MBS Swallow: 1 Procedure SLP visit diagnosis: SLP Visit Diagnosis: Dysphagia, oropharyngeal phase (R13.12) Past Medical History: Past Medical History: Diagnosis Date  Angina of effort (HCC)   BPH (benign prostatic hyperplasia)   Coronary artery disease   Encephalopathy   Hemiparesis (HCC)   Hyperlipidemia   Hypertension   MI (myocardial infarction) (HCC)   Stroke [facility])  Past Surgical History: Past Surgical History: Procedure Laterality Date  CORONARY/GRAFT ACUTE MI REVASCULARIZATION N/A 06/09/2021  Procedure: Coronary/Graft Acute MI Revascularization;  Surgeon: Ammon Blunt, MD;  Location: [facility] INVASIVE CV LAB;  Service:  Cardiovascular;  Laterality: N/A;  LEFT HEART CATH AND CORONARY ANGIOGRAPHY N/A 06/09/2021  Procedure: LEFT HEART CATH AND CORONARY ANGIOGRAPHY;  Surgeon: Ammon Blunt, MD;  Location: [facility] INVASIVE CV LAB;  Service: Cardiovascular;  Laterality: N/A; Happi Overton 03/21/2023, 12:25 PM   Labs:  CBC: Recent Labs    03/24/23 0412 03/25/23 0542 03/26/23 0506 03/27/23 0445  WBC 6.1 6.8 7.4 5.8  HGB 7.7* 7.2* 7.6* 7.7*  HCT 23.8* 21.9* 23.5* 24.2*  PLT 83* 97* 100* 127*    COAGS: Recent Labs    03/27/23 0445  INR 1.4*    BMP: Recent Labs     03/24/23 0412 03/25/23 0542 03/26/23 0506 03/27/23 0445  NA 135 134* 132* 137  K 4.0 3.9 3.7 3.7  CL 110 108 106 108  CO2 22 24 22 25   GLUCOSE 103* 90 106* 111*  BUN 42* 45* 49* 52*  CALCIUM  10.4* 10.9* 11.0* 11.5*  CREATININE 2.42* 2.47* 2.67* 2.95*  GFRNONAA 25* 25* 23* 20*    LIVER FUNCTION TESTS: Recent Labs    03/24/23 0412 03/25/23 0542 03/26/23 0506 03/27/23 0445  BILITOT 0.8 0.7 0.7 0.8  AST 18 18 16  13*  ALT 18 17 15 14   ALKPHOS 31* 31* 31* 32*  PROT 12.0* >12.0* >12.0* >12.0*  ALBUMIN 2.3* 2.2* 2.3* 2.3*     Assessment and Plan: Hypercalcemia AKI Failure to thrive. Plan for image guided bone marrow biopsy tomorrow. Will further discuss with pt tomorrow prior to procedure but have dicussed with Niece/POA who was aware and is in agreement of plan. Risks and benefits of bone marrow biopsy was discussed with the patient's family including, but not limited to bleeding, infection, damage to adjacent structures or low yield requiring additional tests.  All of the questions were answered and there is agreement to proceed.  Consent signed.    Electronically Signed: Franky Rusk, PA-C 03/27/2023, 3:28 PM   I spent a total of 20 minutes in face to face in clinical consultation, greater than 50% of which was counseling/coordinating care for bone marrow biopsy

## 2023-03-27 NOTE — Progress Notes (Signed)
 Mobility Specialist - Progress Note   03/27/23 0900  Mobility  Activity Refused mobility     Pt sleeping heavily on arousal; briefly opened eyes when touched but pt did not arouse to voice or gentle sternal rub. Will attempt at a later date/time.    Lennette Seip Mobility Specialist 03/27/23, 9:46 AM

## 2023-03-27 NOTE — Progress Notes (Addendum)
 Progress Note   Patient: Taten Merrow FMW:[id number] DOB: [birth year] DOA: 03/18/2023     9 DOS: the patient was seen and examined on 03/27/2023   Brief hospital course: 89 year old gentleman with past medical history significant for HLD, HTN, CAD, BPH, H/o CVA w/ left hemiparesis.  Patient resides in independent living facility.  He was brought in with decreased p.o. liquid intake for the past 3 days. He was lethargic on admission.    In the ER patient's vital stable.  Sodium 130, potassium 3.3, creatinine 2.67 baseline normal, total protein greater than 12, hemoglobin 9.7 [baseline 11.8, done 12/09/2022], platelets 114 [baseline normal]. Calcium  elevated to 13. EKG: Atrial fibrillation HR 66, QTc 468.   Patient is admitted for hypercalcemia, started on IV fluids.  Patient's protein is elevated at 12, kidney function worse than baseline.  Oncology evaluated the patient for suspicion of multiple myeloma.  Patient to go for bone marrow biopsy 03/27/23.  Assessment and Plan: Hypercalcemia   Acute anemia High suspicion for multiple myeloma Hypercalcemia possibly due to plasma cell disorder. Presented with weakness, lethargy. Eating poor. Oncology evaluation is appreciated.  LDH 124, beta-2 microglobulin level ordered.  Elevated kappa light chain with increased light chain ratio. Protein >12 - protein electrophoresis pending. Bone marrow biopsy and aspiration for today.  AKI In the setting of possible multiple myeloma. His kidney function worsening.  Creatinine 2.95. Will start gentle IV fluids due to poor oral intake. Avoid nephrotoxic drugs.  Monitor daily renal function.   Hypokalemia  Potassium improved status post repletion. Continue to monitor daily electrolytes   FTT In the setting of plasma cell disorder, possible multiple myeloma PT recommended SNF. Patient is SNF placement as per TOC.   Thrombocytopenia  Platelets lower side but improving.  Platelets 127 today. Monitor  CBC closely and transfuse as indicated   CAD Continue atorvastatin .  Continue metoprolol . Aspirin , plavix  held for bone marrow aspiration and biopsy.   Hyperlipidemia History of CVA with left-sided hemiparesis Hold aspirin  for bone marrow bx, continue Atorvastatin .  Poor nutrition BMI 19.9 Dietician eval. Encourage oral diet.  Nursing supportive care. Fall, aspiration precautions. DVT prophylaxis   Code Status: Full Code  Subjective: Patient is seen and examined today morning. He woke up with touch but falls asleep soon. Eating poor. Hb 7.7, Creatinine, Calcium  creeping up.  Physical Exam: Vitals:   03/27/23 0041 03/27/23 0435 03/27/23 0500 03/27/23 0809  BP: (!) 153/72   (!) 153/75  Pulse: 64   (!) 59  Resp: 17   18  Temp: 99.1 F (37.3 C)  98.7 F (37.1 C) 98.3 F (36.8 C)  TempSrc:   Axillary Oral  SpO2: 97%   95%  Weight:  59.6 kg    Height:        General - Elderly weak African American male, sleeping, no distress HEENT - PERRLA, EOMI, atraumatic head, non tender sinuses. Lung - Clear, basal rales, no rhonchi, wheezes. Heart - S1, S2 heard, no murmurs, rubs, 1+ pedal edema. Abdomen - Soft, non tender, bowel sounds good Neuro - sleepy, unable to do full neuro exam. Skin - Warm and dry.  Data Reviewed:      Latest Ref Rng & Units 03/27/2023    4:45 AM 03/26/2023    5:06 AM 03/25/2023    5:42 AM  CBC  WBC 4.0 - 10.5 K/uL 5.8  7.4  6.8   Hemoglobin 13.0 - 17.0 g/dL 7.7  7.6  7.2   Hematocrit 39.0 - 52.0 %  24.2  23.5  21.9   Platelets 150 - 400 K/uL 127  100  97       Latest Ref Rng & Units 03/27/2023    4:45 AM 03/26/2023    5:06 AM 03/25/2023    5:42 AM  BMP  Glucose 70 - 99 mg/dL 888  893  90   BUN 8 - 23 mg/dL 52  49  45   Creatinine 0.61 - 1.24 mg/dL 7.04  7.32  7.52   Sodium 135 - 145 mmol/L 137  132  134   Potassium 3.5 - 5.1 mmol/L 3.7  3.7  3.9   Chloride 98 - 111 mmol/L 108  106  108   CO2 22 - 32 mmol/L 25  22  24    Calcium  8.9 - 10.3  mg/dL 88.4  88.9  89.0    No results found.  Family Communication: Discussed with patient, he understand and agree. All questions answereed.  Disposition: Status is: Inpatient Remains inpatient appropriate because: hypercalcemia work up, bone marrow biopsy Monday.  Planned Discharge Destination: Skilled nursing facility  MDM level 3-patient admitted with lethargy, weakness, poor oral intake.  Calcium , albumin very high, kidney function worsening patient will need close neurological, hemodynamic, telemetry monitoring.  Patient is at high risk for sudden clinical nutrition.  Author: Concepcion Riser, MD 03/27/2023 11:21 AM Secure chat 7am to 7pm For on call review [email or web address].

## 2023-03-28 ENCOUNTER — Inpatient Hospital Stay: Payer: 59

## 2023-03-28 ENCOUNTER — Inpatient Hospital Stay: Payer: 59 | Admitting: Radiology

## 2023-03-28 DIAGNOSIS — D6481 Anemia due to antineoplastic chemotherapy: Secondary | ICD-10-CM | POA: Diagnosis not present

## 2023-03-28 DIAGNOSIS — D518 Other vitamin B12 deficiency anemias: Secondary | ICD-10-CM | POA: Diagnosis not present

## 2023-03-28 DIAGNOSIS — E8809 Other disorders of plasma-protein metabolism, not elsewhere classified: Secondary | ICD-10-CM

## 2023-03-28 DIAGNOSIS — N179 Acute kidney failure, unspecified: Secondary | ICD-10-CM | POA: Diagnosis not present

## 2023-03-28 DIAGNOSIS — D649 Anemia, unspecified: Secondary | ICD-10-CM

## 2023-03-28 DIAGNOSIS — E876 Hypokalemia: Secondary | ICD-10-CM | POA: Diagnosis not present

## 2023-03-28 LAB — COMPREHENSIVE METABOLIC PANEL
ALT: 14 U/L (ref 0–44)
AST: 18 U/L (ref 15–41)
Albumin: 2.2 g/dL — ABNORMAL LOW (ref 3.5–5.0)
Alkaline Phosphatase: 33 U/L — ABNORMAL LOW (ref 38–126)
Anion gap: 7 (ref 5–15)
BUN: 59 mg/dL — ABNORMAL HIGH (ref 8–23)
CO2: 24 mmol/L (ref 22–32)
Calcium: 11.9 mg/dL — ABNORMAL HIGH (ref 8.9–10.3)
Chloride: 113 mmol/L — ABNORMAL HIGH (ref 98–111)
Creatinine, Ser: 3 mg/dL — ABNORMAL HIGH (ref 0.61–1.24)
GFR, Estimated: 20 mL/min — ABNORMAL LOW (ref >60)
Glucose, Bld: 106 mg/dL — ABNORMAL HIGH (ref 70–99)
Potassium: 3.6 mmol/L (ref 3.5–5.1)
Sodium: 143 mmol/L (ref 135–145)
Total Bilirubin: 0.8 mg/dL (ref 0.0–1.2)
Total Protein: >12 g/dL — ABNORMAL HIGH (ref 6.5–8.1)

## 2023-03-28 LAB — GLUCOSE, CAPILLARY
Glucose-Capillary: 126 mg/dL — ABNORMAL HIGH (ref 70–99)
Glucose-Capillary: 169 mg/dL — ABNORMAL HIGH (ref 70–99)

## 2023-03-28 LAB — CBC
HCT: 24.7 % — ABNORMAL LOW (ref 39.0–52.0)
Hemoglobin: 8 g/dL — ABNORMAL LOW (ref 13.0–17.0)
MCH: 31 pg (ref 26.0–34.0)
MCHC: 32.4 g/dL (ref 30.0–36.0)
MCV: 95.7 fL (ref 80.0–100.0)
Platelets: 136 10*3/uL — ABNORMAL LOW (ref 150–400)
RBC: 2.58 MIL/uL — ABNORMAL LOW (ref 4.22–5.81)
RDW: 17.4 % — ABNORMAL HIGH (ref 11.5–15.5)
WBC: 6 10*3/uL (ref 4.0–10.5)
nRBC: 0 % (ref 0.0–0.2)

## 2023-03-28 LAB — BETA 2 MICROGLOBULIN, SERUM: Beta-2 Microglobulin: 14.8 mg/L — ABNORMAL HIGH (ref 0.6–2.4)

## 2023-03-28 MED ORDER — MIDAZOLAM HCL 2 MG/2ML IJ SOLN
INTRAMUSCULAR | Status: AC | PRN
  Administered 2023-03-28: .5 mg via INTRAVENOUS

## 2023-03-28 MED ORDER — CLOPIDOGREL BISULFATE 75 MG PO TABS
75.0000 mg | ORAL_TABLET | Freq: Every day | ORAL | Status: DC
  Administered 2023-03-29 – 2023-04-06 (×9): 75 mg via ORAL
  Filled 2023-03-28 (×9): qty 1

## 2023-03-28 MED ORDER — DEXAMETHASONE 6 MG PO TABS
20.0000 mg | ORAL_TABLET | Freq: Once | ORAL | Status: DC
Start: 2023-03-28 — End: 2023-03-28
  Filled 2023-03-28: qty 2

## 2023-03-28 MED ORDER — FENTANYL CITRATE (PF) 100 MCG/2ML IJ SOLN
INTRAMUSCULAR | Status: AC
  Filled 2023-03-28: qty 2

## 2023-03-28 MED ORDER — MIDAZOLAM HCL 2 MG/2ML IJ SOLN
INTRAMUSCULAR | Status: AC
  Filled 2023-03-28: qty 2

## 2023-03-28 MED ORDER — DEXTROSE 50 % IV SOLN: 12.5000 g | INTRAVENOUS | Status: DC

## 2023-03-28 MED ORDER — HEPARIN SOD (PORK) LOCK FLUSH 100 UNIT/ML IV SOLN
INTRAVENOUS | Status: AC
  Filled 2023-03-28: qty 5

## 2023-03-28 MED ORDER — ORAL CARE MOUTH RINSE
15.0000 mL | OROMUCOSAL | Status: DC | PRN
Start: 2023-03-28 — End: 2023-04-10

## 2023-03-28 MED ORDER — SODIUM CHLORIDE 0.9 % IV SOLN: INTRAVENOUS | Status: AC

## 2023-03-28 MED ORDER — DEXAMETHASONE SODIUM PHOSPHATE 10 MG/ML IJ SOLN
20.0000 mg | Freq: Once | INTRAMUSCULAR | Status: AC
Start: 2023-03-28 — End: 2023-03-28
  Administered 2023-03-28: 20 mg via INTRAVENOUS
  Filled 2023-03-28: qty 2

## 2023-03-28 MED ORDER — FENTANYL CITRATE (PF) 100 MCG/2ML IJ SOLN
INTRAMUSCULAR | Status: AC | PRN
  Administered 2023-03-28: 25 ug via INTRAVENOUS

## 2023-03-28 MED ORDER — CALCITONIN (SALMON) 200 UNIT/ML IJ SOLN
4.0000 [IU]/kg | Freq: Two times a day (BID) | INTRAMUSCULAR | Status: AC
  Administered 2023-03-28 – 2023-03-29 (×4): 238 [IU] via SUBCUTANEOUS
  Filled 2023-03-28 (×5): qty 1.19

## 2023-03-28 MED ORDER — DEXTROSE 50 % IV SOLN: 12.5000 g | Freq: Once | INTRAVENOUS | Status: DC | PRN

## 2023-03-28 MED ORDER — SODIUM CHLORIDE 0.9 % IV SOLN
60.0000 mg | Freq: Once | INTRAVENOUS | Status: AC
Start: 2023-03-28 — End: 2023-03-28
  Administered 2023-03-28: 60 mg via INTRAVENOUS
  Filled 2023-03-28: qty 20

## 2023-03-28 NOTE — Progress Notes (Signed)
 Occupational Therapy Treatment Patient Details Name: Hunter Bailey MRN: [id number] DOB: [birth year] Today's Date: 03/28/2023   History of present illness Hunter Bailey is an 46yoM who comes to [facility] on 03/18/23 after 3 days of decreased PO intake, pt now lethargic and appears dehydrated. Pt with elevated Ca++: 13. Pt resides at the [facility] [address] PMH: CVA c Lt hemiplegia.   OT comments  Pt is supine in bed on arrival. Asleep, but opens eye with increased verbal cues and sternal rub. He is agreeable to OT session and trying to sit at EOB. He appears to continue to have R shoulder pain during movement. Pt provided a cold wash cloth to assist in waking him up, however he needed total/Max A to perform face washing today. He has been lethargic the last several days with limited therapy participation. Did not initiate BLE movement for bed mobility, but was able to move his legs some once coming to upright seated position. Ultimately needed Max A for all bed mobility today, as well as to maintain seated balance at EOB, as pt seems to be protecting his RUE and would not keep it on the bed for support. He only tolerated ~2 mins seated EOB before he began pushing back posteriorly in attempts to return to supine.  Pt returned to bed with all needs in place and will cont to require skilled acute OT services to maximize his safety and IND to return to PLOF.       If plan is discharge home, recommend the following:  A lot of help with walking and/or transfers;A lot of help with bathing/dressing/bathroom;Direct supervision/assist for financial management;Supervision due to cognitive status;Assistance with cooking/housework;Assist for transportation;Help with stairs or ramp for entrance;Direct supervision/assist for medications management;Two people to help with walking and/or transfers   Equipment Recommendations  Other (comment) (defer to next venue)    Recommendations for Other Services      Precautions /  Restrictions Precautions Precautions: Fall Restrictions Weight Bearing Restrictions Per Provider Order: No       Mobility Bed Mobility Overal bed mobility: Needs Assistance Bed Mobility: Supine to Sit, Sit to Supine     Supine to sit: Max assist, Mod assist, Used rails, HOB elevated Sit to supine: Max assist   General bed mobility comments: pt still remains lethargic with increased time to arouse and very little participation; pt able to move BLEs once movement initiated by therapist but ultimately needed Max A to get to EOB with use of chuck pads; pt leaning to the R and posterior once seated EOB with attempts to square up then began pushing posteriorly attempting to return to supine-Max A needed to return and reposition in bed    Transfers                   General transfer comment: deferred d/t poor seated balance     Balance Overall balance assessment: Needs assistance Sitting-balance support: Feet supported, Single extremity supported Sitting balance-Leahy Scale: Poor Sitting balance - Comments: seated balance poor requiring Max to Mod A to maintain seated balance                                   ADL either performed or assessed with clinical judgement   ADL       Grooming: Wash/dry face;Total assistance;Maximal assistance Grooming Details (indicate cue type and reason): total to max assist to wash face with cold  wash cloth at bed level and once sitting up on EOB                                    Extremity/Trunk Assessment              Vision       Perception     Praxis      Cognition Arousal: Lethargic Behavior During Therapy: WFL for tasks assessed/performed Overall Cognitive Status: Difficult to assess                                 General Comments: garbles speach        Exercises      Shoulder Instructions       General Comments lethargic throughout session    Pertinent Vitals/  Pain       Pain Assessment Pain Assessment: Faces Faces Pain Scale: Hurts a little bit Pain Location: R shoulder Pain Descriptors / Indicators: Aching, Sore Pain Intervention(s): Repositioned, Monitored during session  Home Living                                          Prior Functioning/Environment              Frequency  Min 1X/week        Progress Toward Goals  OT Goals(current goals can now be found in the care plan section)  Progress towards OT goals: Progressing toward goals  Acute Rehab OT Goals Patient Stated Goal: improve balance OT Goal Formulation: Patient unable to participate in goal setting Time For Goal Achievement: 04/03/23 Potential to Achieve Goals: Fair  Plan      Co-evaluation                 AM-PAC OT 6 Clicks Daily Activity     Outcome Measure   Help from another person eating meals?: A Lot Help from another person taking care of personal grooming?: A Lot Help from another person toileting, which includes using toliet, bedpan, or urinal?: Total Help from another person bathing (including washing, rinsing, drying)?: Total Help from another person to put on and taking off regular upper body clothing?: A Lot Help from another person to put on and taking off regular lower body clothing?: Total 6 Click Score: 9    End of Session    OT Visit Diagnosis: Other abnormalities of gait and mobility (R26.89);Unsteadiness on feet (R26.81);Muscle weakness (generalized) (M62.81);Hemiplegia and hemiparesis Hemiplegia - caused by: Cerebral infarction   Activity Tolerance Patient limited by lethargy   Patient Left in bed;with call bell/phone within reach;with bed alarm set   Nurse Communication Mobility status        Time: 9155-9142 OT Time Calculation (min): 13 min  Charges: OT General Charges $OT Visit: 1 Visit OT Treatments $Therapeutic Activity: 8-22 mins  Haliegh Khurana, OTR/L  03/28/23, 11:02 AM   Duwaine FORBES Saupe 03/28/2023, 10:59 AM

## 2023-03-28 NOTE — TOC Progression Note (Addendum)
 Transition of Care [facility]) - Progression Note    Patient Details  Name: Hunter Bailey MRN: [id number] Date of Birth: May 22, 1934  [facility] CM/SW Contact  Corean ONEIDA Haddock, RN Phone Number: 03/28/2023, 9:57 AM  Clinical Narrative:     Plan for bone marrow biopsy today Message sent to MD to determine when it is anticipate patient will medically be ready for SNF in order for authorization to resubmitted      Update:  Patient not medically ready for auth to be initiated   Expected Discharge Plan and Services                                               Social Determinants of Health (SDOH) Interventions SDOH Screenings   Food Insecurity: Patient Unable To Answer (03/22/2023)  Housing: Patient Unable To Answer (03/22/2023)  Transportation Needs: Patient Unable To Answer (03/22/2023)  Utilities: Patient Unable To Answer (03/22/2023)  Social Connections: Unknown (03/22/2023)  Tobacco Use: Medium Risk (03/23/2023)    Readmission Risk Interventions     No data to display

## 2023-03-28 NOTE — Progress Notes (Signed)
 Hematology/Oncology Progress note [phone] [phone]     Patient Care Team: [facility] as PCP - General   Name of the patient: Hunter Bailey  [id number]  Apr 09, 1934  Date of visit: 03/28/23   INTERVAL HISTORY-   S/p bone marrow biopsy today.  Patient is sleepy and lethargic,not able to communicate    Allergies  Allergen Reactions   Penicillins Other (See Comments)    Patient Active Problem List   Diagnosis Date Noted   Hypercalcemia 03/18/2023   AKI (acute kidney injury) (HCC) 03/18/2023   Hypokalemia 03/18/2023   Hyponatremia 03/18/2023   Anemia associated with chemotherapy 03/18/2023   Acute ST elevation myocardial infarction (STEMI) involving left anterior descending (LAD) coronary artery (HCC) 06/09/2021   Chronic kidney disease, stage 3a (HCC) 06/09/2021   BPH (benign prostatic hyperplasia) 06/09/2021   Coronary disease 06/09/2021   Gastro-esophageal reflux disease without esophagitis 06/09/2021   Other vitamin B12 deficiency anemias 06/09/2021   Peripheral vascular disease (HCC) 06/09/2021   Type 2 diabetes mellitus without complications (HCC) 06/09/2021   Left hemiparesis (HCC) 01/08/2021     Past Medical History:  Diagnosis Date   Angina of effort (HCC)    BPH (benign prostatic hyperplasia)    Coronary artery disease    Encephalopathy    Hemiparesis (HCC)    Hyperlipidemia    Hypertension    MI (myocardial infarction) (HCC)    Stroke [facility])      Past Surgical History:  Procedure Laterality Date   CORONARY/GRAFT ACUTE MI REVASCULARIZATION N/A 06/09/2021   Procedure: Coronary/Graft Acute MI Revascularization;  Surgeon: Ammon Blunt, MD;  Location: [facility] INVASIVE CV LAB;  Service: Cardiovascular;  Laterality: N/A;   LEFT HEART CATH AND CORONARY ANGIOGRAPHY N/A 06/09/2021   Procedure: LEFT HEART CATH AND CORONARY ANGIOGRAPHY;  Surgeon: Ammon Blunt, MD;  Location: [facility] INVASIVE CV LAB;  Service:  Cardiovascular;  Laterality: N/A;    Social History   Socioeconomic History   Marital status: Divorced    Spouse name: Not on file   Number of children: Not on file   Years of education: Not on file   Highest education level: Not on file  Occupational History   Not on file  Tobacco Use   Smoking status: Former    Current packs/day: 0.00    Types: Cigarettes    Quit date: 2020    Years since quitting: 5.0    Passive exposure: Past   Smokeless tobacco: Never  Vaping Use   Vaping status: Never Used  Substance and Sexual Activity   Alcohol  use: Not Currently   Drug use: Not Currently   Sexual activity: Not Currently  Other Topics Concern   Not on file  Social History Narrative   Not on file   Social Drivers of Health   Financial Resource Strain: Not on file  Food Insecurity: Patient Unable To Answer (03/22/2023)   Hunger Vital Sign    Worried About Running Out of Food in the Last Year: Patient unable to answer    Ran Out of Food in the Last Year: Patient unable to answer  Transportation Needs: Patient Unable To Answer (03/22/2023)   PRAPARE - Transportation    Lack of Transportation (Medical): Patient unable to answer    Lack of Transportation (Non-Medical): Patient unable to answer  Physical Activity: Not on file  Stress: Not on file  Social Connections: Unknown (03/22/2023)   Social Connection and Isolation Panel [NHANES]    Frequency of Communication with Friends  and Family: Patient unable to answer    Frequency of Social Gatherings with Friends and Family: Patient unable to answer    Attends Religious Services: Patient declined    Active Member of Clubs or Organizations: Patient declined    Attends Banker Meetings: Patient unable to answer    Marital Status: Patient unable to answer  Intimate Partner Violence: Patient Unable To Answer (03/22/2023)   Humiliation, Afraid, Rape, and Kick questionnaire    Fear of Current or Ex-Partner: Patient unable to answer     Emotionally Abused: Patient unable to answer    Physically Abused: Patient unable to answer    Sexually Abused: Patient unable to answer     History reviewed. No pertinent family history.   Current Facility-Administered Medications:    0.9 %  sodium chloride  infusion, , Intravenous, Continuous, Sreeram, Narendranath, MD, Last Rate: 100 mL/hr at 03/28/23 1150, Restarted at 03/28/23 1150   acetaminophen  (TYLENOL ) tablet 650 mg, 650 mg, Oral, Q6H PRN **OR** acetaminophen  (TYLENOL ) suppository 650 mg, 650 mg, Rectal, Q6H PRN, Crosley, Debby, MD   alum & mag hydroxide-simeth (MAALOX/MYLANTA) 200-200-20 MG/5ML suspension 30 mL, 30 mL, Oral, Q6H PRN, Sreeram, Narendranath, MD   atorvastatin  (LIPITOR ) tablet 20 mg, 20 mg, Oral, Daily, Djan, Prince T, MD, 20 mg at 03/26/23 9093   calcitonin (MIACALCIN ) injection 238 Units, 4 Units/kg, Subcutaneous, BID, Sreeram, Narendranath, MD, 238 Units at 03/28/23 1008   cyanocobalamin  (VITAMIN B12) tablet 1,000 mcg, 1,000 mcg, Oral, Daily, Babara Call, MD, 1,000 mcg at 03/26/23 0906   feeding supplement (ENSURE ENLIVE / ENSURE PLUS) liquid 237 mL, 237 mL, Oral, TID BM, Sreeram, Narendranath, MD, 237 mL at 03/26/23 2155   heparin  injection 5,000 Units, 5,000 Units, Subcutaneous, Q8H, Crosley, Debby, MD, 5,000 Units at 03/27/23 2048   latanoprost  (XALATAN ) 0.005 % ophthalmic solution 1 drop, 1 drop, Both Eyes, QHS, Sreeram, Narendranath, MD, 1 drop at 03/27/23 2044   metoprolol  tartrate (LOPRESSOR ) injection 5 mg, 5 mg, Intravenous, Q4H PRN, Crosley, Debby, MD   ondansetron  (ZOFRAN ) tablet 4 mg, 4 mg, Oral, Q6H PRN **OR** ondansetron  (ZOFRAN ) injection 4 mg, 4 mg, Intravenous, Q6H PRN, Crosley, Debby, MD   Oral care mouth rinse, 15 mL, Mouth Rinse, PRN, Sreeram, Narendranath, MD   pamidronate  (AREDIA ) 60 mg in sodium chloride  0.9 % 500 mL IVPB, 60 mg, Intravenous, Once, Babara Call, MD, Last Rate: 130 mL/hr at 03/28/23 1517, 60 mg at 03/28/23 1517   timolol  (TIMOPTIC )  0.5 % ophthalmic solution 1 drop, 1 drop, Both Eyes, BID, Sreeram, Narendranath, MD, 1 drop at 03/28/23 1009  Facility-Administered Medications Ordered in Other Encounters:    clopidogrel  (PLAVIX ) tablet, , , PRN, Paraschos, Alexander, MD, 600 mg at 06/09/21 2049   Physical exam:  Vitals:   03/28/23 1115 03/28/23 1130 03/28/23 1145 03/28/23 1320  BP: (!) 150/81 (!) 164/82 (!) 148/84 (!) 158/88  Pulse: 73 75 75 78  Resp: (!) 22 (!) 28 20 18   Temp:   98.2 F (36.8 C) 98.2 F (36.8 C)  TempSrc:      SpO2: 95% 95% 94% 92%  Weight:      Height:       Physical Exam Constitutional:      Appearance: He is not diaphoretic.  HENT:     Head: Normocephalic and atraumatic.  Eyes:     General: No scleral icterus. Cardiovascular:     Rate and Rhythm: Normal rate.  Pulmonary:     Effort: Pulmonary effort is normal. No  respiratory distress.  Abdominal:     General: There is no distension.     Palpations: Abdomen is soft.     Tenderness: There is no abdominal tenderness.  Musculoskeletal:        General: Normal range of motion.     Cervical back: Normal range of motion and neck supple.  Skin:    General: Skin is warm and dry.  Neurological:     Motor: No abnormal muscle tone.     Comments: sleepy and lethargic  Psychiatric:        Mood and Affect: Affect normal.       Labs    Latest Ref Rng & Units 03/28/2023    4:55 AM 03/27/2023    4:45 AM 03/26/2023    5:06 AM  CBC  WBC 4.0 - 10.5 K/uL 6.0  5.8  7.4   Hemoglobin 13.0 - 17.0 g/dL 8.0  7.7  7.6   Hematocrit 39.0 - 52.0 % 24.7  24.2  23.5   Platelets 150 - 400 K/uL 136  127  100       Latest Ref Rng & Units 03/28/2023    4:55 AM 03/27/2023    4:45 AM 03/26/2023    5:06 AM  CMP  Glucose 70 - 99 mg/dL 893  888  893   BUN 8 - 23 mg/dL 59  52  49   Creatinine 0.61 - 1.24 mg/dL 6.99  7.04  7.32   Sodium 135 - 145 mmol/L 143  137  132   Potassium 3.5 - 5.1 mmol/L 3.6  3.7  3.7   Chloride 98 - 111 mmol/L 113  108  106   CO2  22 - 32 mmol/L 24  25  22    Calcium  8.9 - 10.3 mg/dL 88.0  88.4  88.9   Total Protein 6.5 - 8.1 g/dL >87.9  >87.9  >87.9   Total Bilirubin 0.0 - 1.2 mg/dL 0.8  0.8  0.7   Alkaline Phos 38 - 126 U/L 33  32  31   AST 15 - 41 U/L 18  13  16    ALT 0 - 44 U/L 14  14  15       RADIOGRAPHIC STUDIES: I have personally reviewed the radiological images as listed and agreed with the findings in the report. CT BONE MARROW BIOPSY & ASPIRATION Result Date: 03/28/2023 INDICATION: 89 year old male referred for bone marrow biopsy EXAM: CT BONE MARROW BIOPSY AND ASPIRATION MEDICATIONS: None. ANESTHESIA/SEDATION: Moderate (conscious) sedation was employed during this procedure. A total of Versed  0.5 mg and Fentanyl  25 mcg was administered intravenously. Moderate Sedation Time: 14 minutes. The patient's level of consciousness and vital signs were monitored continuously by radiology nursing throughout the procedure under my direct supervision. FLUOROSCOPY TIME:  CT COMPLICATIONS: None PROCEDURE: Informed written consent was obtained from the patient's family after a thorough discussion of the procedural risks, benefits and alternatives. All questions were addressed. Maximal Sterile Barrier Technique was utilized including caps, mask, sterile gowns, sterile gloves, sterile drape, hand hygiene and skin antiseptic. A timeout was performed prior to the initiation of the procedure. Patient was positioned right decubitus on the CT gantry table, ultimately rolled into a right posterior oblique position. Scout CT of the pelvis was performed for surgical planning purposes. The posterior pelvis was prepped with Chlorhexidine  in a sterile fashion, and a sterile drape was applied covering the operative field. A sterile gown and sterile gloves were used for the procedure. Local anesthesia was provided with  1% Lidocaine . Posterior right iliac bone was targeted for biopsy. The skin and subcutaneous tissues were infiltrated with 1%  lidocaine  without epinephrine. A small stab incision was made with an 11 blade scalpel, and an 11 gauge Murphy needle was advanced with CT guidance to the posterior cortex. Manual forced was used to advance the needle through the posterior cortex and the stylet was removed. A bone marrow aspirate was retrieved and passed to a cytotechnologist in the room. The Murphy needle was then advanced without the stylet for a core biopsy. The core biopsy was retrieved and also passed to a cytotechnologist. Manual pressure was used for hemostasis and a sterile dressing was placed. No complications were encountered no significant blood loss was encountered. Patient tolerated the procedure well and remained hemodynamically stable throughout. IMPRESSION: Status post image guided bone marrow biopsy. Signed, Ami RAMAN. Alona ROSALEA GRAVER, RPVI [facility] Radiology Electronically Signed   By: Ami Alona D.O.   On: 03/28/2023 13:02   DG Bone Survey Met Result Date: 03/24/2023 CLINICAL DATA:  Elevated immunoglobulin free light chain level. Hypercalcemia. EXAM: METASTATIC BONE SURVEY COMPARISON:  CT abdomen pelvis dated August 16, 2021. Chest x-ray dated June 09, 2021. FINDINGS: No sclerotic or lytic lesion in the visualized axial or appendicular skeleton. Degenerative changes of the spine, shoulders, hips, and knees. IMPRESSION: 1. No evident bony lesion. Electronically Signed   By: Elsie ONEIDA Shoulder M.D.   On: 03/24/2023 16:55   US  RENAL Result Date: 03/23/2023 CLINICAL DATA:  Acute kidney injury EXAM: RENAL / URINARY TRACT ULTRASOUND COMPLETE COMPARISON:  Ultrasound 2015.  CT renal stone 08/16/2021. FINDINGS: Right Kidney: Renal measurements: 11.7 x 6.0 x 5.9 cm = volume: 215 mL. No collecting system dilatation or perinephric fluid. Upper pole renal cyst measuring 2.9 x 3.0 x 3.0 cm. Two thousand fifteen this has smaller at 15 mm. This has seen on prior CT as well. Left Kidney:  Imaging of the left kidney. Patient declined additional images as per the sonographer of the left kidney due to pain. Bladder: Underdistended. Diffuse wall thickening. Large prostate. Prostate was not able to be measured by the sonographer at this time. Ureteral jets were seen. Other: None. IMPRESSION: Limited examination as the patient did not want to continue due to pain. Left kidney is not imaged. No right-sided renal collecting system dilatation. Persistent upper pole right-sided renal cysts. Enlarged prostate with mass effect along the base of the bladder. Bladder wall thickening and trabeculation. Electronically Signed   By: Ranell Bring M.D.   On: 03/23/2023 16:59   DG Swallowing Func-Speech Pathology Result Date: 03/21/2023 Table formatting from the original result was not included. Modified Barium Swallow Study Patient Details Name: Paras Kreider MRN: [id number] Date of Birth: 1934/05/30 Today's Date: 03/21/2023 HPI/PMH: HPI: Pt is an 88yoM who [facility] after 3 days of decreased PO intake, pt now lethargic and appears dehydrated. Pt with elevated Ca++ 13.  Pt resides at the [place] [facility] [address] PMH: CVA w/ Left hemiplegia.   CXR: No active disease.  Of Note: Pt indicated he had been being tx'd for UTI problems at the NH in the past month. Clinical Impression: Clinical Impression: Pt presents with moderate oropharyngeal dysphagia that places him at a high risk of aspiration when consuming thin liquids and nectar thick liquids via cup sips.     Pt's oral phase is c/b repetitive tongue movements during bolus propulsion and overall decreased mastication observed.  Pt's pharyngeal phase is c/b severely delayed swallow initiation with boluses resting in the pyriform sinuses for > 5 seconds. When consuming cup sips of thin liquids and nectar thick liquids, boluses spill into airway d/t larger bolus size and inability to contain them within his pyriform sinuses. Aspiration is reduced when  consuming thin liquids and nectar thick liquids via spoon.     Given the above, recommend dysphagia 1 diet with thin liquids via spoon, medicine crushed in applesause with strict adherence to supervision and aspiration precautions. Factors that may increase risk of adverse event in presence of aspiration [name] & [name] 2021): Factors that may increase risk of adverse event in presence of aspiration [name] & [name] 2021): Reduced cognitive function; Limited mobility; Dependence for feeding and/or oral hygiene Recommendations/Plan: Swallowing Evaluation Recommendations Swallowing Evaluation Recommendations Recommendations: PO diet PO Diet Recommendation: Dysphagia 1 (Pureed); Thin liquids (Level 0) Liquid Administration via: Spoon Medication Administration: Crushed with puree Supervision: Staff to assist with self-feeding; Full supervision/cueing for swallowing strategies Swallowing strategies  : Minimize environmental distractions; Slow rate; Small bites/sips Postural changes: Position pt fully upright for meals; Stay upright 30-60 min after meals Oral care recommendations: Oral care BID (2x/day) Caregiver Recommendations: -- (TBD) Treatment Plan Treatment Plan Treatment recommendations: No treatment recommended at this time Follow-up recommendations: No SLP follow up Functional status assessment: Patient has had a recent decline in their functional status and/or demonstrates limited ability to make significant improvements in function in a reasonable and predictable amount of time. Recommendations Recommendations for follow up therapy are one component of a multi-disciplinary discharge planning process, led by the attending physician.  Recommendations may be updated based on patient status, additional functional criteria and insurance authorization. Assessment: Orofacial Exam: Orofacial Exam Oral Cavity: Oral Hygiene: WFL Oral Cavity - Dentition: Poor condition; Missing dentition Orofacial Anatomy: WFL Oral  Motor/Sensory Function: Unable to test (left side facial weakness) Anatomy: Anatomy: WFL; Suspected cervical osteophytes Boluses Administered: Boluses Administered Boluses Administered: Thin liquids (Level 0); Mildly thick liquids (Level 2, nectar thick); Puree  Oral Impairment Domain: Oral Impairment Domain Lip Closure: Escape beyond mid-chin (on left) Tongue control during bolus hold: Not tested Bolus preparation/mastication: Minimal chewing/mashing with majority of bolus unchewed Bolus transport/lingual motion: Repetitive/disorganized tongue motion Oral residue: Trace residue lining oral structures Location of oral residue : Floor of mouth; Tongue; Palate Initiation of pharyngeal swallow : Pyriform sinuses  Pharyngeal Impairment Domain: Pharyngeal Impairment Domain Soft palate elevation: No bolus between soft palate (SP)/pharyngeal wall (PW) Laryngeal elevation: Partial superior movement of thyroid  cartilage/partial approximation of arytenoids to epiglottic petiole Anterior hyoid excursion: Partial anterior movement Epiglottic movement: Complete inversion Laryngeal vestibule closure: Incomplete, narrow column air/contrast in laryngeal vestibule Pharyngeal stripping wave : Present - complete Pharyngeal contraction (A/P view only): Complete Pharyngoesophageal segment opening: Complete distension and complete duration, no obstruction of flow Tongue base retraction: No contrast between tongue base and posterior pharyngeal wall (PPW) Pharyngeal residue: Complete pharyngeal clearance  Esophageal Impairment Domain: Esophageal Impairment Domain Esophageal clearance upright position: Complete clearance, esophageal coating Pill: No data recorded Penetration/Aspiration Scale Score: Penetration/Aspiration Scale Score 1.  Material does not enter airway: Thin liquids (Level 0); Mildly thick liquids (Level 2, nectar thick); Puree 8.  Material enters airway, passes BELOW cords without attempt by patient to eject out (silent  aspiration) : Thin liquids (Level 0); Mildly thick liquids (Level 2, nectar thick) (vis cup sips) Compensatory Strategies: Compensatory Strategies Compensatory strategies: No   General Information: Caregiver present: No  Diet Prior to this  Study: NPO   Temperature : Normal   Respiratory Status: WFL   Supplemental O2: None (Room air)   History of Recent Intubation: No  Behavior/Cognition: Alert; Distractible; Requires cueing; Doesn't follow directions Self-Feeding Abilities: Needs set-up for self-feeding; Needs assist with self-feeding; Needs hand-over-hand assist for feeding Baseline vocal quality/speech: Normal Volitional Cough: Unable to elicit Volitional Swallow: Unable to elicit Exam Limitations: No limitations Goal Planning: Prognosis for improved oropharyngeal function: Fair Barriers to Reach Goals: Cognitive deficits; Time post onset; Severity of deficits Barriers/Prognosis Comment: old R CVA; missing/poor Dentition; LUE weakness Patient/Family Stated Goal: none stated Consulted and agree with results and recommendations: Patient; Physician; Nurse Pain: Pain Assessment Pain Assessment: Faces Faces Pain Scale: 4 Pain Location: R shoulder Pain Descriptors / Indicators: Aching; Sore Pain Intervention(s): Monitored during session; Repositioned End of Session: Start Time:SLP Start Time (ACUTE ONLY): 1135 Stop Time: SLP Stop Time (ACUTE ONLY): 1155 Time Calculation:SLP Time Calculation (min) (ACUTE ONLY): 20 min Charges: SLP Evaluations $ SLP Speech Visit: 1 Visit SLP Evaluations $BSS Swallow: 1 Procedure $MBS Swallow: 1 Procedure SLP visit diagnosis: SLP Visit Diagnosis: Dysphagia, oropharyngeal phase (R13.12) Past Medical History: Past Medical History: Diagnosis Date  Angina of effort (HCC)   BPH (benign prostatic hyperplasia)   Coronary artery disease   Encephalopathy   Hemiparesis (HCC)   Hyperlipidemia   Hypertension   MI (myocardial infarction) (HCC)   Stroke [facility])  Past Surgical History: Past Surgical  History: Procedure Laterality Date  CORONARY/GRAFT ACUTE MI REVASCULARIZATION N/A 06/09/2021  Procedure: Coronary/Graft Acute MI Revascularization;  Surgeon: Ammon Blunt, MD;  Location: [facility] INVASIVE CV LAB;  Service: Cardiovascular;  Laterality: N/A;  LEFT HEART CATH AND CORONARY ANGIOGRAPHY N/A 06/09/2021  Procedure: LEFT HEART CATH AND CORONARY ANGIOGRAPHY;  Surgeon: Ammon Blunt, MD;  Location: [facility] INVASIVE CV LAB;  Service: Cardiovascular;  Laterality: N/A; Happi Overton 03/21/2023, 12:25 PM   Assessment and plan-   # Anemia, AKI, hypercalcemia, elevated kappa light chain with increased light chain ratio.  Protein electrophoresis is pending. Suspect underlying plasma cell disorder-i.e. multiple myeloma.   S/p bone marrow biopsy. Result is pending. Empiric Dexamethasone  20mg  x 1.    # Hypercalcemia calcium  has improved after hydration.    PTH is decreased likely secondary hypercalcemia. Calcium  level is increasing. Corrected calcium  12.9 Skeletal survey negative for bone lesions.  Continue hydration, calcitonin. Add 1 dose of pamidronate .   # AKI, pending above workup. 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 US  renal showed no hydronephrosis.  Avoid nephrotoxins.   Empiric steroid.    # Anemia, hemoglobin 7.6.  Multifactorial.  Possibly due to underlying bone marrow involvement of plasma cell disorders. B12 is also borderline low.  continue B12 supplementation.  Thank you for allowing me to participate in the care of this patient.   Zelphia Cap, MD, PhD Hematology Oncology 03/28/2023

## 2023-03-28 NOTE — Progress Notes (Signed)
 PT Cancellation Note  Patient Details Name: Hunter Bailey MRN: [id number] DOB: 1934-08-17   Cancelled Treatment:    Reason Eval/Treat Not Completed: Medical issues which prohibited therapy;Patient at procedure or test/unavailable (Pt preparing to go [facility] for bone biopsi soon. WIll continue to follow, resume services as appropriate.)  9:18 AM, 03/28/23 Peggye JAYSON Linear, PT, DPT Physical Therapist - [facility]  [phone] ([facility]COM)    Alexzandra Bilton C 03/28/2023, 9:18 AM

## 2023-03-28 NOTE — Progress Notes (Signed)
 Progress Note   Patient: Hunter Bailey FMW:[id number] DOB: 02-23-1935 DOA: 03/18/2023     10 DOS: the patient was seen and examined on 03/28/2023   Brief hospital course: 89 year old gentleman with past medical history significant for HLD, HTN, CAD, BPH, H/o CVA w/ left hemiparesis.  Patient resides in independent living facility.  He was brought in with decreased p.o. liquid intake for the past 3 days. He was lethargic on admission.    In the ER patient's vital stable.  Sodium 130, potassium 3.3, creatinine 2.67 baseline normal, total protein greater than 12, hemoglobin 9.7 [baseline 11.8, done 12/09/2022], platelets 114 [baseline normal]. Calcium  elevated to 13. EKG: Atrial fibrillation HR 66, QTc 468.   Patient is admitted for hypercalcemia, started on IV fluids.  Patient's protein is elevated at 12, kidney function worse than baseline.  Oncology evaluated the patient for suspicion of multiple myeloma.  Patient got bone marrow biopsy 03/28/23. His calcium  is high started IV hydration, Calcitonin. Pamidronate , steroids per oncology team.  Assessment and Plan: Hypercalcemia  Acute anemia Kidney dysfunction High suspicion for multiple myeloma Presented with weakness, lethargy. Eating poor. Oncology evaluation is appreciated.  LDH 124, beta-2 microglobulin level ordered.  Elevated kappa light chain with increased light chain ratio. Protein >12 - protein electrophoresis pending. Hb around 8.  Calcium  11.9 creeping up.  He started on IV hydration, calcitonin therapy. Pamidronate  and Decadron  ordered by oncology team. Bone marrow biopsy and aspiration done today. Follow cytology report, oncology recommendations.  AKI In the setting of possible multiple myeloma. His kidney function worsening.  Creatinine 3.00. Renal ultrasound showed enlarged prostate. Urine output ~1L. Continue gentle IV fluids as he is eating poor. Avoid nephrotoxic drugs.  Monitor daily renal function.   Hypokalemia   Potassium improved status post repletion. Continue to monitor daily electrolytes    Thrombocytopenia  Platelets lower side but improving.  Platelets 136 today. Monitor CBC closely and transfuse as indicated   CAD Continue atorvastatin .  Continue metoprolol , Plavix .   Hyperlipidemia History of CVA with left-sided hemiparesis continue Atorvastatin , Plavix .  Poor nutrition BMI 19.9 Dietician eval. Encourage oral diet.  Failure to thrive In the setting of hypercalcemia, AKI, possible multiple myeloma PT recommended SNF. TOC to work on authorization for SNF once medically stable.  Nursing supportive care. Fall, aspiration precautions. DVT prophylaxis   Code Status: Full Code  Subjective: Patient is seen and examined today morning. His mental status worrisome. More awake but confused per RN after bone marrow bx procedure. Eating poor. Hb 8.0, Creatinine, Calcium  creeping up.  Physical Exam: Vitals:   03/28/23 1130 03/28/23 1145 03/28/23 1320 03/28/23 1551  BP: (!) 164/82 (!) 148/84 (!) 158/88 (!) 154/81  Pulse: 75 75 78 75  Resp: (!) 28 20 18 18   Temp:  98.2 F (36.8 C) 98.2 F (36.8 C) 98.5 F (36.9 C)  TempSrc:      SpO2: 95% 94% 92%   Weight:      Height:        General - Elderly weak African American male, sleeping, no distress HEENT - PERRLA, EOMI, atraumatic head, non tender sinuses. Lung - Clear, basal rales, no rhonchi, wheezes. Heart - S1, S2 heard, no murmurs, rubs, 1+ pedal edema. Abdomen - Soft, non tender, bowel sounds good Neuro - sleepy and lethargic, unable to do full neuro exam. Skin - Warm and dry.  Data Reviewed:      Latest Ref Rng & Units 03/28/2023    4:55 AM 03/27/2023  4:45 AM 03/26/2023    5:06 AM  CBC  WBC 4.0 - 10.5 K/uL 6.0  5.8  7.4   Hemoglobin 13.0 - 17.0 g/dL 8.0  7.7  7.6   Hematocrit 39.0 - 52.0 % 24.7  24.2  23.5   Platelets 150 - 400 K/uL 136  127  100       Latest Ref Rng & Units 03/28/2023    4:55 AM 03/27/2023     4:45 AM 03/26/2023    5:06 AM  BMP  Glucose 70 - 99 mg/dL 893  888  893   BUN 8 - 23 mg/dL 59  52  49   Creatinine 0.61 - 1.24 mg/dL 6.99  7.04  7.32   Sodium 135 - 145 mmol/L 143  137  132   Potassium 3.5 - 5.1 mmol/L 3.6  3.7  3.7   Chloride 98 - 111 mmol/L 113  108  106   CO2 22 - 32 mmol/L 24  25  22    Calcium  8.9 - 10.3 mg/dL 88.0  88.4  88.9    CT BONE MARROW BIOPSY & ASPIRATION Result Date: 03/28/2023 INDICATION: 89 year old male referred for bone marrow biopsy EXAM: CT BONE MARROW BIOPSY AND ASPIRATION MEDICATIONS: None. ANESTHESIA/SEDATION: Moderate (conscious) sedation was employed during this procedure. A total of Versed  0.5 mg and Fentanyl  25 mcg was administered intravenously. Moderate Sedation Time: 14 minutes. The patient's level of consciousness and vital signs were monitored continuously by radiology nursing throughout the procedure under my direct supervision. FLUOROSCOPY TIME:  CT COMPLICATIONS: None PROCEDURE: Informed written consent was obtained from the patient's family after a thorough discussion of the procedural risks, benefits and alternatives. All questions were addressed. Maximal Sterile Barrier Technique was utilized including caps, mask, sterile gowns, sterile gloves, sterile drape, hand hygiene and skin antiseptic. A timeout was performed prior to the initiation of the procedure. Patient was positioned right decubitus on the CT gantry table, ultimately rolled into a right posterior oblique position. Scout CT of the pelvis was performed for surgical planning purposes. The posterior pelvis was prepped with Chlorhexidine  in a sterile fashion, and a sterile drape was applied covering the operative field. A sterile gown and sterile gloves were used for the procedure. Local anesthesia was provided with 1% Lidocaine . Posterior right iliac bone was targeted for biopsy. The skin and subcutaneous tissues were infiltrated with 1% lidocaine  without epinephrine. A small stab incision  was made with an 11 blade scalpel, and an 11 gauge Murphy needle was advanced with CT guidance to the posterior cortex. Manual forced was used to advance the needle through the posterior cortex and the stylet was removed. A bone marrow aspirate was retrieved and passed to a cytotechnologist in the room. The Murphy needle was then advanced without the stylet for a core biopsy. The core biopsy was retrieved and also passed to a cytotechnologist. Manual pressure was used for hemostasis and a sterile dressing was placed. No complications were encountered no significant blood loss was encountered. Patient tolerated the procedure well and remained hemodynamically stable throughout. IMPRESSION: Status post image guided bone marrow biopsy. Signed, Ami RAMAN. Alona ROSALEA GRAVER, RPVI [facility] Radiology Electronically Signed   By: Ami Alona D.O.   On: 03/28/2023 13:02    Family Communication: Discussed with patient, he understand and agree. All questions answereed.  Disposition: Status is: Inpatient Remains inpatient appropriate because: hypercalcemia work up and treatment, bone marrow biopsy.  Planned Discharge Destination: Skilled nursing facility  MDM level 3-patient admitted with lethargy, weakness, poor oral intake.  Calcium , albumin very high, kidney function worsening patient will need close neurological, hemodynamic, telemetry monitoring.  Patient is at high risk for sudden clinical nutrition.  Author: Concepcion Riser, MD 03/28/2023 4:01 PM Secure chat 7am to 7pm For on call review [email or web address].

## 2023-03-28 NOTE — Plan of Care (Signed)
  Problem: Activity: Goal: Risk for activity intolerance will decrease Outcome: Not Progressing   Problem: Nutrition: Goal: Adequate nutrition will be maintained Outcome: Not Progressing   Problem: Elimination: Goal: Will not experience complications related to bowel motility Outcome: Progressing   Problem: Skin Integrity: Goal: Risk for impaired skin integrity will decrease Outcome: Progressing

## 2023-03-28 NOTE — Procedures (Addendum)
 Interventional Radiology Procedure Note  Procedure: CT guided aspirate and core biopsy of right posterior iliac bone Complications: None Recommendations: - Bedrest supine x 1 hrs, then ambulate per primary - OTC's & ice PRN  Pain - Follow biopsy results - ok to advance diet per primary order  Signed,  Ami RAMAN. Alona, DO, ABVM, RPVI

## 2023-03-29 ENCOUNTER — Inpatient Hospital Stay: Payer: 59

## 2023-03-29 LAB — CBC
HCT: 26.1 % — ABNORMAL LOW (ref 39.0–52.0)
Hemoglobin: 8.3 g/dL — ABNORMAL LOW (ref 13.0–17.0)
MCH: 31.4 pg (ref 26.0–34.0)
MCHC: 31.8 g/dL (ref 30.0–36.0)
MCV: 98.9 fL (ref 80.0–100.0)
Platelets: 126 10*3/uL — ABNORMAL LOW (ref 150–400)
RBC: 2.64 MIL/uL — ABNORMAL LOW (ref 4.22–5.81)
RDW: 17.5 % — ABNORMAL HIGH (ref 11.5–15.5)
WBC: 6.4 10*3/uL (ref 4.0–10.5)
nRBC: 0.3 % — ABNORMAL HIGH (ref 0.0–0.2)

## 2023-03-29 LAB — GLUCOSE, CAPILLARY
Glucose-Capillary: 113 mg/dL — ABNORMAL HIGH (ref 70–99)
Glucose-Capillary: 116 mg/dL — ABNORMAL HIGH (ref 70–99)
Glucose-Capillary: 117 mg/dL — ABNORMAL HIGH (ref 70–99)
Glucose-Capillary: 140 mg/dL — ABNORMAL HIGH (ref 70–99)
Glucose-Capillary: 148 mg/dL — ABNORMAL HIGH (ref 70–99)

## 2023-03-29 LAB — COMPREHENSIVE METABOLIC PANEL
ALT: 14 U/L (ref 0–44)
AST: 17 U/L (ref 15–41)
Albumin: 2.2 g/dL — ABNORMAL LOW (ref 3.5–5.0)
Alkaline Phosphatase: 31 U/L — ABNORMAL LOW (ref 38–126)
Anion gap: 5 (ref 5–15)
BUN: 68 mg/dL — ABNORMAL HIGH (ref 8–23)
CO2: 22 mmol/L (ref 22–32)
Calcium: 10.4 mg/dL — ABNORMAL HIGH (ref 8.9–10.3)
Chloride: 116 mmol/L — ABNORMAL HIGH (ref 98–111)
Creatinine, Ser: 2.58 mg/dL — ABNORMAL HIGH (ref 0.61–1.24)
GFR, Estimated: 24 mL/min — ABNORMAL LOW (ref >60)
Glucose, Bld: 168 mg/dL — ABNORMAL HIGH (ref 70–99)
Potassium: 3.5 mmol/L (ref 3.5–5.1)
Sodium: 143 mmol/L (ref 135–145)
Total Bilirubin: 0.5 mg/dL (ref 0.0–1.2)
Total Protein: 12.2 g/dL — ABNORMAL HIGH (ref 6.5–8.1)

## 2023-03-29 MED ORDER — BISACODYL 10 MG RE SUPP
10.0000 mg | Freq: Once | RECTAL | Status: AC
  Administered 2023-03-29: 10 mg via RECTAL
  Filled 2023-03-29: qty 1

## 2023-03-29 MED ORDER — SODIUM CHLORIDE 0.9 % IV SOLN: INTRAVENOUS | Status: DC

## 2023-03-29 NOTE — Progress Notes (Addendum)
Progress Note   Patient: Hunter Bailey ZOX:[id number] DOB: 07-10-1934 DOA: 03/18/2023     11 DOS: the patient was seen and examined on 03/29/2023   Brief hospital course: 89 year old gentleman with past medical history significant for HLD, HTN, CAD, BPH, H/o CVA w/ left hemiparesis.  Patient resides in independent living facility.  He was brought in with decreased p.o. liquid intake for the past 3 days. He was lethargic on admission.    In the ER patient's vital stable.  Sodium 130, potassium 3.3, creatinine 2.67 baseline normal, total protein greater than 12, hemoglobin 9.7 [baseline 11.8, done 12/09/2022], platelets 114 [baseline normal]. Calcium elevated to 13. EKG: Atrial fibrillation HR 66, QTc 468.    Patient is admitted for hypercalcemia, started on IV fluids.  Patient's protein is elevated at 12, kidney function worse than baseline.  Oncology evaluated the patient for suspicion of multiple myeloma.  Patient got bone marrow biopsy 03/28/23. His calcium is high started IV hydration, Calcitonin. Pamidronate, steroids per oncology team      Assessment and Plan:  Hypercalcemia  Acute anemia AKI High suspicion for multiple myeloma Presented with weakness, lethargy.  Poor oral intake Oncology evaluation is appreciated.  LDH 124, beta-2 microglobulin level ordered.  Elevated kappa light chain with increased light chain ratio. Protein >12 - protein electrophoresis pending. Hb around 8.  Calcium 11.9 creeping up.   Patient was started on IV hydration, calcitonin therapy, pamidronate and Decadron ordered by oncology team Bone marrow biopsy and aspiration done today.  Results pending Follow cytology report, oncology recommendations.    AKI In the setting of possible multiple myeloma. Baseline serum creatinine of 1.20 from 09/24 and on admission it was 2.67 No significant improvement in renal function since admission Renal ultrasound showed enlarged prostate. Urine output ~1L. Continue  gentle IV fluids as he is eating poor. Avoid nephrotoxic drugs.  Monitor daily renal function.    Hypokalemia  Potassium improved status post repletion. Continue to monitor daily electrolytes     Thrombocytopenia  Platelets on the lower side of normal but improved Monitor CBC closely and transfuse as indicated   CAD Continue atorvastatin.  Continue metoprolol, Plavix.   Hyperlipidemia History of CVA with left-sided hemiparesis continue Atorvastatin, Plavix.   Poor nutrition BMI 19.9 Dietician eval. Encourage oral diet.   Failure to thrive Acute metabolic encephalopathy In the setting of hypercalcemia, AKI, possible multiple myeloma Remains very lethargic and arouses only to very painful stimuli Called and spoke to patient's niece and HPOA Latasha Holgate.   Discussed patient's current condition and plan of care with her in detail, goals of care was discussed and she wants to talk to other family members prior to making a decision   Nursing supportive care. Fall, aspiration precautions. DVT prophylaxis   Code Status: Full Code            Subjective: Lethargic and arouses to painful stimuli  Physical Exam: Vitals:   03/28/23 1939 03/29/23 0338 03/29/23 0340 03/29/23 0750  BP: (!) 149/88 (!) 163/90  (!) 171/87  Pulse: 76 66  66  Resp: 18 18  20   Temp: 98.4 F (36.9 C) 98 F (36.7 C)  97.7 F (36.5 C)  TempSrc: Oral Oral  Axillary  SpO2: 95% 99%  97%  Weight:   57 kg   Height:      General - Elderly weak African American male, lethargic and only arouses to painful stimuli, dry mucous membranes HEENT - PERRLA, EOMI, atraumatic head, non tender  sinuses. Lung - Clear, basal rales, no rhonchi, wheezes. Heart - S1, S2 heard, no murmurs, rubs, 1+ pedal edema. Abdomen - Soft, non tender, bowel sounds good Neuro - lethargic, unable to do full neuro exam. Skin - Warm and dry    Data Reviewed: Labs reviewed.  BUN 68, creatinine 2.58 There are no new results to  review at this time.  Family Communication: Plan of care discussed with patient's niece and healthcare power of attorney  Disposition: Status is: Inpatient Remains inpatient appropriate because: Discharge disposition  Planned Discharge Destination:  TBD    Time spent: 40 minutes  Author: Lucile Shutters, MD 03/29/2023 12:27 PM  For on call review [email or web address].

## 2023-03-29 NOTE — Plan of Care (Signed)
  Problem: Education: Goal: Knowledge of General Education information will improve Description: Including pain rating scale, medication(s)/side effects and non-pharmacologic comfort measures Outcome: Progressing   Problem: Health Behavior/Discharge Planning: Goal: Ability to manage health-related needs will improve Outcome: Progressing   Problem: Clinical Measurements: Goal: Ability to maintain clinical measurements within normal limits will improve Outcome: Progressing Goal: Will remain free from infection Outcome: Progressing Goal: Diagnostic test results will improve Outcome: Progressing Goal: Respiratory complications will improve Outcome: Progressing Goal: Cardiovascular complication will be avoided Outcome: Progressing   Problem: Activity: Goal: Risk for activity intolerance will decrease Outcome: Not Progressing   Problem: Nutrition: Goal: Adequate nutrition will be maintained Outcome: Not Progressing   Problem: Coping: Goal: Level of anxiety will decrease Outcome: Progressing   Problem: Elimination: Goal: Will not experience complications related to bowel motility Outcome: Progressing Goal: Will not experience complications related to urinary retention Outcome: Progressing   Problem: Pain Management: Goal: General experience of comfort will improve Outcome: Progressing   Problem: Safety: Goal: Ability to remain free from injury will improve Outcome: Progressing   Problem: Skin Integrity: Goal: Risk for impaired skin integrity will decrease Outcome: Progressing

## 2023-03-29 NOTE — TOC Progression Note (Signed)
 Transition of Care [facility]) - Progression Note    Patient Details  Name: Justinian Roberg MRN: [id number] Date of Birth: [birth year]  [facility]) CM/SW Contact  Loman Risk, RN Phone Number: 03/29/2023, 3:01 PM  Clinical Narrative:     Oncoming MD aware that patient will require insurance auth for SNF when medically appropriate        Expected Discharge Plan and Services                                               Social Determinants of Health (SDOH) Interventions SDOH Screenings   Food Insecurity: Patient Unable To Answer (03/22/2023)  Housing: Patient Unable To Answer (03/22/2023)  Transportation Needs: Patient Unable To Answer (03/22/2023)  Utilities: Patient Unable To Answer (03/22/2023)  Social Connections: Unknown (03/22/2023)  Tobacco Use: Medium Risk (03/23/2023)    Readmission Risk Interventions     No data to display

## 2023-03-29 NOTE — Progress Notes (Signed)
 Initial Nutrition Assessment  DOCUMENTATION CODES:   Severe malnutrition in context of chronic illness  INTERVENTION:   Ensure Enlive po TID, each supplement provides 350 kcal and 20 grams of protein.  MVI po with diet advancement   Pt at high refeed risk; recommend monitor potassium, magnesium and phosphorus labs daily until stable  Daily weights   NUTRITION DIAGNOSIS:   Severe Malnutrition related to chronic illness as evidenced by severe fat depletion, severe muscle depletion.  GOAL:   Patient will meet greater than or equal to 90% of their needs  MONITOR:   Diet advancement, Labs, Weight trends, I & O's, Skin  REASON FOR ASSESSMENT:   Consult Assessment of nutrition requirement/status  ASSESSMENT:   89 y/o male with h/o BPH, CAD, CVA with L hemiparesis, DM, CKD III, PVD, GERD, HTN, MI and HLD who is admitted with AKI, hypercalcemia, AMS, anemia and high suspicion for multiple myeloma.  Visited pt's room today. Pt is unable to provide any history r/t AMS. Pt is malnourished. Per chart, pt appears to be down 10lbs(8%) over the past 3 months; this is significant weight loss. Pt currently NPO. MRI brain pending. Pt is ordered for Ensure. RD will add MVI daily with diet advancement. Pt is likely at high refeed risk. Palliative care consult is pending.   Medications reviewed and include: calcitonin, plavix , B12, heparin , NaCl @75ml /hr   Labs reviewed: K 3.5 wnl, BUN 68(H), creat 2.58(H), Ca 10.4(H) P 3.6 wnl, Mg 2.5(H)- 1/13 B12 191- 1/6  Hgb 8.3(L), Hct 26.1(L) Cbgs- 117, 116, 140, 148 x 24 hrs  AIC 6.4(H)- 05/2021   NUTRITION - FOCUSED PHYSICAL EXAM:  Flowsheet Row Most Recent Value  Orbital Region Moderate depletion  Upper Arm Region Severe depletion  Thoracic and Lumbar Region Severe depletion  Buccal Region Mild depletion  Temple Region Severe depletion  Clavicle Bone Region Moderate depletion  Clavicle and Acromion Bone Region Moderate depletion  Scapular  Bone Region Moderate depletion  Dorsal Hand Severe depletion  Patellar Region Severe depletion  Anterior Thigh Region Severe depletion  Posterior Calf Region Severe depletion  Edema (RD Assessment) None  Hair Reviewed  Eyes Reviewed  Mouth Reviewed  Skin Reviewed  Nails Reviewed   Diet Order:   Diet Order             Diet NPO time specified Except for: Sips with Meds  Diet effective midnight                  EDUCATION NEEDS:   Not appropriate for education at this time  Skin:  Skin Assessment: Reviewed RN Assessment  Last BM:  pta  Height:   Ht Readings from Last 1 Encounters:  03/18/23 5\' 8"  (1.727 m)    Weight:   Wt Readings from Last 1 Encounters:  03/29/23 57 kg    Ideal Body Weight:  70 kg  BMI:  Body mass index is 19.11 kg/m.  Estimated Nutritional Needs:   Kcal:  1700-2000kcal/day  Protein:  85-100g/day  Fluid:  1.5-1.7L/day  Torrance Freestone MS, RD, LDN If unable to be reached, please send secure chat to "RD inpatient" available from 8:00a-4:00p daily

## 2023-03-29 NOTE — Progress Notes (Signed)
 PT Cancellation Note  Patient Details Name: Hunter Bailey MRN: [id number] DOB: 1935/01/23   Cancelled Treatment:    Reason Eval/Treat Not Completed: Patient at procedure or test/unavailable (Chart reviewed, RN consulted. Pt remains lethargic, disoriented, somnolent. Pt OTF for CT 2/2 continued AMS. Will continue to follow.)   [name] C 03/29/2023, 1:38 PM

## 2023-03-30 ENCOUNTER — Inpatient Hospital Stay: Payer: 59

## 2023-03-30 ENCOUNTER — Encounter: Payer: Self-pay | Admitting: Radiology

## 2023-03-30 DIAGNOSIS — E43 Unspecified severe protein-calorie malnutrition: Secondary | ICD-10-CM | POA: Insufficient documentation

## 2023-03-30 DIAGNOSIS — E860 Dehydration: Secondary | ICD-10-CM | POA: Diagnosis not present

## 2023-03-30 DIAGNOSIS — D518 Other vitamin B12 deficiency anemias: Secondary | ICD-10-CM | POA: Diagnosis not present

## 2023-03-30 DIAGNOSIS — Z515 Encounter for palliative care: Secondary | ICD-10-CM | POA: Diagnosis not present

## 2023-03-30 LAB — CBC
HCT: 26.4 % — ABNORMAL LOW (ref 39.0–52.0)
Hemoglobin: 8.3 g/dL — ABNORMAL LOW (ref 13.0–17.0)
MCH: 31.3 pg (ref 26.0–34.0)
MCHC: 31.4 g/dL (ref 30.0–36.0)
MCV: 99.6 fL (ref 80.0–100.0)
Platelets: 103 10*3/uL — ABNORMAL LOW (ref 150–400)
RBC: 2.65 MIL/uL — ABNORMAL LOW (ref 4.22–5.81)
RDW: 17.7 % — ABNORMAL HIGH (ref 11.5–15.5)
WBC: 6.6 10*3/uL (ref 4.0–10.5)
nRBC: 0.6 % — ABNORMAL HIGH (ref 0.0–0.2)

## 2023-03-30 LAB — PROTEIN ELECTROPHORESIS, SERUM
A/G Ratio: 0.4 — ABNORMAL LOW (ref 0.7–1.7)
Albumin ELP: 3.6 g/dL (ref 2.9–4.4)
Alpha-1-Globulin: 0.3 g/dL (ref 0.0–0.4)
Alpha-2-Globulin: 0.8 g/dL (ref 0.4–1.0)
Beta Globulin: 0.9 g/dL (ref 0.7–1.3)
Gamma Globulin: 7 g/dL — ABNORMAL HIGH (ref 0.4–1.8)
Globulin, Total: 8.9 g/dL — ABNORMAL HIGH (ref 2.2–3.9)
M-Spike, %: 5.8 g/dL — ABNORMAL HIGH
Total Protein ELP: 12.5 g/dL — ABNORMAL HIGH (ref 6.0–8.5)

## 2023-03-30 LAB — COMPREHENSIVE METABOLIC PANEL
ALT: 12 U/L (ref 0–44)
AST: 16 U/L (ref 15–41)
Albumin: 2.2 g/dL — ABNORMAL LOW (ref 3.5–5.0)
Alkaline Phosphatase: 31 U/L — ABNORMAL LOW (ref 38–126)
Anion gap: 4 — ABNORMAL LOW (ref 5–15)
BUN: 73 mg/dL — ABNORMAL HIGH (ref 8–23)
CO2: 20 mmol/L — ABNORMAL LOW (ref 22–32)
Calcium: 9.9 mg/dL (ref 8.9–10.3)
Chloride: 125 mmol/L — ABNORMAL HIGH (ref 98–111)
Creatinine, Ser: 2.22 mg/dL — ABNORMAL HIGH (ref 0.61–1.24)
GFR, Estimated: 28 mL/min — ABNORMAL LOW (ref >60)
Glucose, Bld: 123 mg/dL — ABNORMAL HIGH (ref 70–99)
Potassium: 3.2 mmol/L — ABNORMAL LOW (ref 3.5–5.1)
Sodium: 149 mmol/L — ABNORMAL HIGH (ref 135–145)
Total Bilirubin: 0.8 mg/dL (ref 0.0–1.2)
Total Protein: 11.7 g/dL — ABNORMAL HIGH (ref 6.5–8.1)

## 2023-03-30 LAB — SURGICAL PATHOLOGY

## 2023-03-30 LAB — GLUCOSE, CAPILLARY
Glucose-Capillary: 109 mg/dL — ABNORMAL HIGH (ref 70–99)
Glucose-Capillary: 131 mg/dL — ABNORMAL HIGH (ref 70–99)
Glucose-Capillary: 98 mg/dL (ref 70–99)

## 2023-03-30 MED ORDER — POTASSIUM CHLORIDE 2 MEQ/ML IV SOLN
INTRAVENOUS | Status: AC
  Filled 2023-03-30: qty 500

## 2023-03-30 NOTE — Progress Notes (Addendum)
Progress Note   Patient: Hunter Bailey [id number]:[id number] DOB: 01-30-1935 DOA: 03/18/2023     12 DOS: the patient was seen and examined on 03/30/2023   Brief hospital course:  89 year old gentleman with past medical history significant for HLD, HTN, CAD, BPH, H/o CVA w/ left hemiparesis.  Patient resides in independent living facility.  He was brought in with decreased p.o. liquid intake for the past 3 days. He was lethargic on admission.    In the ER patient's vital stable.  Sodium 130, potassium 3.3, creatinine 2.67 baseline normal, total protein greater than 12, hemoglobin 9.7 [baseline 11.8, done 12/09/2022], platelets 114 [baseline normal]. Calcium elevated to 13. EKG: Atrial fibrillation HR 66, QTc 468.    Patient is admitted for hypercalcemia, started on IV fluids.  Patient's protein is elevated at 12, kidney function worse than baseline.  Oncology evaluated the patient for suspicion of multiple myeloma.  Patient got bone marrow biopsy 03/28/23. His calcium is high started IV hydration, Calcitonin. Pamidronate, steroids per oncology team      Assessment and Plan:  Hypercalcemia  Acute anemia AKI High suspicion for multiple myeloma Presented with weakness, lethargy.  Poor oral intake Oncology evaluation is appreciated.  LDH 124, beta-2 microglobulin level ordered.  Elevated kappa light chain with increased light chain ratio. Protein >12 - protein electrophoresis pending. Hb around 8.  Calcium 9.9 following IV hydration, calcitonin therapy, pamidronate and Decadron ordered by oncology team Bone marrow biopsy and aspiration done .  Results pending Follow cytology report, oncology recommendations.     AKI In the setting of possible multiple myeloma. Baseline serum creatinine of 1.20 from 09/24 and on admission it was 2.67 No significant improvement in renal function since admission Renal ultrasound showed enlarged prostate. Urine output ~1L. Continue gentle IV fluids as he is eating  poor. Avoid nephrotoxic drugs.  Monitor daily renal function.    Hypokalemia  Hypernatremia Start patient on free water to improve sodium levels Supplement potassium Continue to monitor daily electrolytes     Thrombocytopenia  Platelets on the lower side of normal but improved Monitor CBC closely and transfuse as indicated   CAD Continue atorvastatin.  Continue metoprolol, Plavix.   Hyperlipidemia History of CVA with left-sided hemiparesis continue Atorvastatin, Plavix.    Severe malnutrition In the context of chronic illness as evidenced by severe fat depletion, severe muscle depletion.  INTERVENTION:    Ensure Enlive po TID, each supplement provides 350 kcal and 20 grams of protein.   MVI po with diet advancement    Pt at high refeed risk; recommend monitor potassium, magnesium and phosphorus labs daily until stable   Daily weights      Failure to thrive Acute metabolic encephalopathy In the setting of hypercalcemia, AKI, possible multiple myeloma Remains very lethargic and arouses only to very painful stimuli Patient had a CT scan of the head without contrast which showed hypodensity in the right posterior frontal and anterior parietal lobe, some of which appears chronic, but some of which is age indeterminate and may be acute to subacute, particularly posteriorly. MRI is recommended for further evaluation. 2. Additional chronic encephalomalacia in the posterior left frontal lobe and right basal ganglia MRI of the brain without contrast shows no acute intracranial abnormality. Multiple old infarcts and sequelae of chronic small vessel disease.   Attempted to reach patient's niece Hunter Bailey to discuss patient's condition and poor prognosis.  Spoke to her at length on 01/15 Unable to leave a voicemail.  Awaiting callback  Nursing supportive care. Fall, aspiration precautions. DVT prophylaxis   Code Status: Full Code                 Subjective:  Opens eyes to loud verbal stimuli  Physical Exam: Vitals:   03/29/23 1959 03/30/23 0342 03/30/23 0343 03/30/23 0834  BP: (!) 159/80 (!) 168/86  (!) 175/82  Pulse: 70 73  76  Resp: 18 18  16   Temp: 98.8 F (37.1 C) 98.3 F (36.8 C)  (!) 97.5 F (36.4 C)  TempSrc: Oral   Oral  SpO2: 91% 93%  97%  Weight:   42 kg   Height:       General - Elderly weak African American male, awake, opens eyes to verbal stimuli, very dry mucous membranes HEENT - PERRLA, EOMI, atraumatic head, non tender sinuses. Lung -bilateral air entry Heart - S1, S2 heard, no murmurs, rubs, 1+ pedal edema. Abdomen - Soft, non tender, bowel sounds good Neuro -generalized weakness Skin - Warm and dry   Data Reviewed: Labs reviewed.  Sodium 149, potassium 3.2, creatinine 2.22, hemoglobin 8.3 There are no new results to review at this time.  Family Communication: Attempted to contact patient's niece and healthcare power of attorney.  Awaiting callback  Disposition: Status is: Inpatient Remains inpatient appropriate because: Discharge disposition  Planned Discharge Destination:  TBD    Time spent: 40  minutes  Author: Lucile Shutters, MD 03/30/2023 11:58 AM  For on call review [email or web address].

## 2023-03-30 NOTE — Plan of Care (Signed)

## 2023-03-30 NOTE — Progress Notes (Signed)
PT Cancellation Note  Patient Details Name: Hunter Bailey MRN: [id number] DOB: [birth year]   Cancelled Treatment:    Reason Eval/Treat Not Completed: Fatigue/lethargy limiting ability to participate (Pt has been unable to participate with our services for several days now due to ongoing lethargy, somnolence. Will signoff at this time. Consider another PT consult should pt be better able to participate.)  4:26 PM, 03/30/23 Rosamaria Lints, PT, DPT Physical Therapist - [facility]  [phone] (ASCOM)    [name] 03/30/2023, 4:26 PM

## 2023-03-30 NOTE — Plan of Care (Signed)
°  Problem: Education: Goal: Knowledge of General Education information will improve Description: Including pain rating scale, medication(s)/side effects and non-pharmacologic comfort measures Outcome: Not Progressing   Problem: Health Behavior/Discharge Planning: Goal: Ability to manage health-related needs will improve Outcome: Not Progressing   Problem: Clinical Measurements: Goal: Respiratory complications will improve Outcome: Progressing Goal: Cardiovascular complication will be avoided Outcome: Progressing

## 2023-03-30 NOTE — Consult Note (Signed)
Consultation Note Date: 03/30/2023 at 1045  Patient Name: Hunter Bailey  DOB: 1934-07-24  MRN: [id number]  Age / Sex: 89 y.o., male  PCP: [facility] Referring Physician: Lucile Shutters, MD  HPI/Patient Profile: 89 y.o. male  with past medical history of CVA with left hemiparesis, BPH, CAD, HTN, HLD admitted on 03/18/2023 with decreased p.o. intake X 3 days and lethargy.  Patient is a resident of independent living facility.  Upon presentation to ED, calcium elevated to 13.    Oncology was consulted for suspicion of multiple myeloma.  03/28/2023, patient underwent bone marrow biopsy.  Patient admitted for hypercalcemia and treated with IV hydration, calcitonin, pamidronate, and steroids as per oncology team.  ENT was consulted to discuss goals of care.  Clinical Assessment and Goals of Care: Extensive chart review completed prior to meeting patient including labs, vital signs, imaging, progress notes, orders, and available advanced directive documents from current and previous encounters. I then met with patient at bedside to discuss diagnosis prognosis, GOC, EOL wishes, disposition and options.  He is sleeping but acknowledges my presence with loud verbal stimuli and gentle touch.  He acknowledges my presence but does not appear to be able to make his wishes known.  He makes no vocalizations during my visit.  He nodded yes when the nurse asked him if he would like something to drink.  He was unable to respond to basic orientation questions.  Patient unable to participate in goals of care medical decision making's independently at this time.  Counseled with RN.  Patient has no acute issues or need for adjustment to [facility] at this time.  After visiting with the patient, attempted to speak with his niece over the phone.  No answer.  HIPAA compliant voicemail left with PMT contact info.  Awaiting callback  from niece to continue goals of care discussions.  PMT will continue to follow and support patient and family throughout his hospitalization.  Primary Decision Maker NEXT OF KIN  Physical Exam Vitals reviewed.  Constitutional:      General: He is not in acute distress.    Appearance: He is normal weight.  HENT:     Head: Normocephalic.     Mouth/Throat:     Mouth: Mucous membranes are dry.  Pulmonary:     Effort: Pulmonary effort is normal.  Abdominal:     Palpations: Abdomen is soft.  Musculoskeletal:     Comments: Generalized weakness  Skin:    General: Skin is warm and dry.  Neurological:     Mental Status: He is alert.     Comments: Pt made no vocalizations during my visit  Psychiatric:        Behavior: Behavior normal.        Judgment: Judgment normal.     Palliative Assessment/Data: 30%     Thank you for this consult. Palliative medicine will continue to follow and assist holistically.   Time Total: 40 minutes  Time spent includes: Detailed review of medical records (labs, imaging, vital  signs), medically appropriate exam (mental status, respiratory, cardiac, skin), discussed with treatment team, counseling and educating patient, family and staff, documenting clinical information, medication management and coordination of care.  Signed by: Georgiann Cocker, DNP, FNP-BC Palliative Medicine   Please contact Palliative Medicine Team providers via [facility] for questions and concerns.

## 2023-03-31 DIAGNOSIS — Z515 Encounter for palliative care: Secondary | ICD-10-CM

## 2023-03-31 DIAGNOSIS — E860 Dehydration: Secondary | ICD-10-CM | POA: Diagnosis not present

## 2023-03-31 DIAGNOSIS — C9000 Multiple myeloma not having achieved remission: Secondary | ICD-10-CM

## 2023-03-31 DIAGNOSIS — E43 Unspecified severe protein-calorie malnutrition: Secondary | ICD-10-CM | POA: Diagnosis not present

## 2023-03-31 DIAGNOSIS — D518 Other vitamin B12 deficiency anemias: Secondary | ICD-10-CM | POA: Diagnosis not present

## 2023-03-31 LAB — CBC
HCT: 27.9 % — ABNORMAL LOW (ref 39.0–52.0)
Hemoglobin: 8.7 g/dL — ABNORMAL LOW (ref 13.0–17.0)
MCH: 30.4 pg (ref 26.0–34.0)
MCHC: 31.2 g/dL (ref 30.0–36.0)
MCV: 97.6 fL (ref 80.0–100.0)
Platelets: 97 10*3/uL — ABNORMAL LOW (ref 150–400)
RBC: 2.86 MIL/uL — ABNORMAL LOW (ref 4.22–5.81)
RDW: 17.8 % — ABNORMAL HIGH (ref 11.5–15.5)
WBC: 8.1 10*3/uL (ref 4.0–10.5)
nRBC: 0.4 % — ABNORMAL HIGH (ref 0.0–0.2)

## 2023-03-31 LAB — COMPREHENSIVE METABOLIC PANEL
ALT: 12 U/L (ref 0–44)
AST: 16 U/L (ref 15–41)
Albumin: 2.2 g/dL — ABNORMAL LOW (ref 3.5–5.0)
Alkaline Phosphatase: 30 U/L — ABNORMAL LOW (ref 38–126)
Anion gap: 2 — ABNORMAL LOW (ref 5–15)
BUN: 72 mg/dL — ABNORMAL HIGH (ref 8–23)
CO2: 22 mmol/L (ref 22–32)
Calcium: 9.8 mg/dL (ref 8.9–10.3)
Chloride: 129 mmol/L — ABNORMAL HIGH (ref 98–111)
Creatinine, Ser: 1.93 mg/dL — ABNORMAL HIGH (ref 0.61–1.24)
GFR, Estimated: 33 mL/min — ABNORMAL LOW (ref >60)
Glucose, Bld: 119 mg/dL — ABNORMAL HIGH (ref 70–99)
Potassium: 3.2 mmol/L — ABNORMAL LOW (ref 3.5–5.1)
Sodium: 153 mmol/L — ABNORMAL HIGH (ref 135–145)
Total Bilirubin: 0.6 mg/dL (ref 0.0–1.2)
Total Protein: 11.7 g/dL — ABNORMAL HIGH (ref 6.5–8.1)

## 2023-03-31 LAB — GLUCOSE, CAPILLARY
Glucose-Capillary: 114 mg/dL — ABNORMAL HIGH (ref 70–99)
Glucose-Capillary: 125 mg/dL — ABNORMAL HIGH (ref 70–99)
Glucose-Capillary: 153 mg/dL — ABNORMAL HIGH (ref 70–99)
Glucose-Capillary: 168 mg/dL — ABNORMAL HIGH (ref 70–99)

## 2023-03-31 LAB — MAGNESIUM: Magnesium: 2.4 mg/dL (ref 1.7–2.4)

## 2023-03-31 MED ORDER — POTASSIUM CHLORIDE 2 MEQ/ML IV SOLN
INTRAVENOUS | Status: DC
  Filled 2023-03-31 (×3): qty 1000

## 2023-03-31 MED ORDER — HYDRALAZINE HCL 20 MG/ML IJ SOLN
10.0000 mg | Freq: Four times a day (QID) | INTRAMUSCULAR | Status: DC | PRN
  Filled 2023-03-31: qty 1

## 2023-03-31 MED ORDER — DEXAMETHASONE SODIUM PHOSPHATE 10 MG/ML IJ SOLN
20.0000 mg | Freq: Once | INTRAMUSCULAR | Status: AC
  Administered 2023-03-31: 20 mg via INTRAVENOUS
  Filled 2023-03-31: qty 5
  Filled 2023-03-31: qty 2

## 2023-03-31 NOTE — TOC Progression Note (Signed)
Transition of Care [facility]) - Progression Note    Patient Details  Name: Hunter Bailey MRN: [id number] Date of Birth: 08/11/1934  Transition of Care [facility]) CM/SW Contact  Chapman Fitch, RN Phone Number: 03/31/2023, 12:49 PM  Clinical Narrative:      Per MD patient not medically ready for auth to be started. Per MD, MD and palliative attempted to reach family to discuss hospice       Expected Discharge Plan and Services                                               Social Determinants of Health (SDOH) Interventions SDOH Screenings   Food Insecurity: Patient Unable To Answer (03/22/2023)  Housing: Patient Unable To Answer (03/22/2023)  Transportation Needs: Patient Unable To Answer (03/22/2023)  Utilities: Patient Unable To Answer (03/22/2023)  Social Connections: Unknown (03/22/2023)  Tobacco Use: Medium Risk (03/23/2023)    Readmission Risk Interventions     No data to display

## 2023-03-31 NOTE — Plan of Care (Signed)

## 2023-03-31 NOTE — Progress Notes (Addendum)
[facility] Liaison Note  New referral to evaluate patient for the Hospice Home.  Patient has been lethargic and non-responsive for several days. Not taking any food or drink.  Upon visiting patient in the room, patient was sitting up in bed with RN feeding him yougurt and giving him water.  She stated that patient just awakened about an hour ago and was asking for something to eat and drink.  HL will evaluate patient tomorrow to see if he is appropriate for the inpatient unit.  Hospital team aware    Please don't hesitate to call with any Hospice related questions or concerns.    Thank you for the opportunity to participate in this patient's care.  [facility] Liaison [phone]

## 2023-03-31 NOTE — Progress Notes (Addendum)
Progress Note   Patient: Hunter Bailey IHK:[id number] DOB: 1934/06/04 DOA: 03/18/2023     13 DOS: the patient was seen and examined on 03/31/2023   Brief hospital course:  89 year old gentleman with past medical history significant for HLD, HTN, CAD, BPH, H/o CVA w/ left hemiparesis.  Patient resides in independent living facility.  He was brought in with decreased p.o. liquid intake for the past 3 days. He was lethargic on admission.    In the ER patient's vital stable.  Sodium 130, potassium 3.3, creatinine 2.67 baseline normal, total protein greater than 12, hemoglobin 9.7 [baseline 11.8, done 12/09/2022], platelets 114 [baseline normal]. Calcium elevated to 13. EKG: Atrial fibrillation HR 66, QTc 468.    Patient is admitted for hypercalcemia, started on IV fluids.  Patient's protein is elevated at 12, kidney function worse than baseline.  Oncology evaluated the patient for suspicion of multiple myeloma.  Patient got bone marrow biopsy 03/28/23. His calcium is high started IV hydration, Calcitonin. Pamidronate, steroids per oncology team     Assessment and Plan:   Hypercalcemia  Acute anemia AKI High suspicion for multiple myeloma Presented with weakness, lethargy.  Poor oral intake Oncology evaluation is appreciated.  LDH 124, beta-2 microglobulin level ordered.  Elevated kappa light chain with increased light chain ratio. Protein >12 - protein electrophoresis pending. Hb around 8.  Calcium 9.9 following IV hydration, calcitonin therapy, pamidronate and Decadron ordered by oncology team Bone marrow biopsy and aspiration done .  Results pending Follow cytology report, oncology recommendations.     AKI In the setting of possible multiple myeloma. Baseline serum creatinine of 1.20 from 09/24 and on admission it was 2.67 No significant improvement in renal function since admission Renal ultrasound showed enlarged prostate. Urine output ~1L. Continue gentle IV fluids as he is eating  poor. Avoid nephrotoxic drugs.  Monitor daily renal function.     Hypokalemia  Hypernatremia Dehydration Patient noted to have very dry mucous membranes and poor skin turgor Continue free water to improve sodium levels Supplement potassium Continue to monitor daily electrolytes     Thrombocytopenia  Platelets on the lower side of normal but improved Monitor CBC closely and transfuse as indicated    CAD Continue atorvastatin.  Continue metoprolol, Plavix.    Hyperlipidemia History of CVA with left-sided hemiparesis continue Atorvastatin, Plavix.     Severe malnutrition In the context of chronic illness as evidenced by severe fat depletion, severe muscle depletion.  INTERVENTION:    Ensure Enlive po TID, each supplement provides 350 kcal and 20 grams of protein.   MVI po with diet advancement    Pt at high refeed risk; recommend monitor potassium, magnesium and phosphorus labs daily until stable   Daily weights        Failure to thrive Acute metabolic encephalopathy In the setting of hypercalcemia, AKI, possible multiple myeloma More awake and alert.  No verbal response Patient had a CT scan of the head without contrast which showed hypodensity in the right posterior frontal and anterior parietal lobe, some of which appears chronic, but some of which is age indeterminate and may be acute to subacute, particularly posteriorly. MRI is recommended for further evaluation. 2. Additional chronic encephalomalacia in the posterior left frontal lobe and right basal ganglia MRI of the brain without contrast shows no acute intracranial abnormality. Multiple old infarcts and sequelae of chronic small vessel disease. Appreciate speech therapy input, recommends a dysphagia 1 diet with thin liquids and strict aspiration precautions.  Patient  requires assistance with meals   Nursing supportive care. Fall, aspiration precautions. DVT prophylaxis   Code Status: Full Code        Subjective: Awake  Physical Exam: Vitals:   03/31/23 0434 03/31/23 0443 03/31/23 0829 03/31/23 1100  BP: (!) 159/97  (!) 176/98 (!) 154/90  Pulse: 75  83 67  Resp: 19  16   Temp: 97.8 F (36.6 C)  97.8 F (36.6 C)   TempSrc: Oral  Oral   SpO2: 100%  98% 98%  Weight:  42 kg    Height:       General - Elderly weak African American male, awake, opens eyes to verbal stimuli, very dry mucous membranes HEENT - PERRLA, EOMI, atraumatic head, non tender sinuses. Lung -bilateral air entry Heart - S1, S2 heard, no murmurs, rubs, 1+ pedal edema. Abdomen - Soft, non tender, bowel sounds good Neuro -generalized weakness Skin - Warm and dry      Data Reviewed: Labs reviewed.  Sodium 153, potassium 3.3 There are no new results to review at this time.  Family Communication: Discussed patient's condition and plan of care with his niece and healthcare power of attorney Hunter Bailey over the phone.  All questions and concerns have been addressed.  She is in agreement that patient may be discharged to a hospice house or skilled nursing facility with hospice due to his poor quality of life and poor prognosis.  CODE STATUS was discussed and patient is a DNR, she does not want any aggressive intervention.  Disposition: Status is: Inpatient Remains inpatient appropriate because: Discharge disposition  Planned Discharge Destination:  TBD    Time spent: 35  minutes  Author: Lucile Shutters, MD 03/31/2023 1:56 PM  For on call review [email or web address].

## 2023-03-31 NOTE — Progress Notes (Signed)
Mobility Specialist - Progress Note   03/31/23 1117  Mobility  Activity Contraindicated/medical hold     Pt too lethargic to participate in mobility. Will attempt another date as medically appropriate.    Filiberto Pinks Mobility Specialist 03/31/23, 11:19 AM

## 2023-03-31 NOTE — Plan of Care (Signed)
  Problem: Education: Goal: Knowledge of General Education information will improve Description: Including pain rating scale, medication(s)/side effects and non-pharmacologic comfort measures Outcome: Not Progressing   Problem: Clinical Measurements: Goal: Respiratory complications will improve Outcome: Progressing Goal: Cardiovascular complication will be avoided Outcome: Progressing   Problem: Nutrition: Goal: Adequate nutrition will be maintained Outcome: Not Progressing

## 2023-03-31 NOTE — Progress Notes (Signed)
Daily Progress Note   Patient Name: Hunter Bailey       Date: 03/31/2023 DOB: November 23, 1934  Age: 89 y.o. MRN#: [id number] Attending Physician: Lucile Shutters, MD Primary Care Physician: [facility] Admit Date: 03/18/2023  Reason for Consultation/Follow-up: Establishing goals of care  HPI/Brief Hospital Review: 89 y.o. male  with past medical history of CVA with left hemiparesis, BPH, CAD, HTN, HLD admitted on 03/18/2023 with decreased p.o. intake X 3 days and lethargy.   Patient is a resident of [facility] [address] [facility].  Upon presentation to ED, calcium elevated to 13.     Oncology was consulted for suspicion of multiple myeloma.  03/28/2023, patient underwent bone marrow biopsy, results pending.   Patient admitted for hypercalcemia and treated with IV hydration, calcitonin, pamidronate, and steroids as per oncology team.  Remains encephalopathic, MRI 1/16 negative for acute intracranial findings  Palliative Medicine consulted for assisting with goals of care conversations.  Subjective: Extensive chart review has been completed prior to meeting patient including labs, vital signs, imaging, progress notes, orders, and available advanced directive documents from current and previous encounters.    Visited with Mr. Manns at his bedside. He is resting in bed with eyes closed, he is able to open eyes to repeated calling of his name and gentle touch. He is unable to communicate and easily drifts back off to sleep without redirection. No family at bedside during time of visit.  Called and spoke with niece/HCPOA-[name] Melecio. Provided medical updates. Reviewed MRI imaging from yesterday. We discussed his current status, remains lethargic and unable to communicate or  follow commands. Unable to participate with mobility/therapy.  Attempted to elicit goals of care. [name] shares a provider reached out earlier in the week to discuss code status. At that time she asked for more time to discuss with Mr. Distasi brother who resides with him at the [place] [facility] [address]. [name] shares collectively she and Mr. Kleinhans brother have decided on DNR/DNI status. Orders placed to reflect desire for DNR/DNI.  We also discussed the difference between continuing with current plan of care versus shifting our focus and transitioning to comfort care and pursuing hospice services. [name] wishes to discuss with Mr. Muro brother prior to decision making regarding goals of care. Plan set to meet at bedside tomorrow at  11AM.  Answered and addressed all questions and concerns. PMT to remain available for ongoing needs and support.  Care plan was discussed with primary team.  Thank you for allowing the Palliative Medicine Team to assist in the care of this patient.  Total time:  35 minutes  Time spent includes: Detailed review of medical records (labs, imaging, vital signs), medically appropriate exam (mental status, respiratory, cardiac, skin), discussed with treatment team, counseling and educating patient, family and staff, documenting clinical information, medication management and coordination of care.  [name], DNP, AGNP-C Palliative Medicine   Please contact Palliative Medicine Team phone at [phone] for questions and concerns.

## 2023-03-31 NOTE — TOC Progression Note (Signed)
Transition of Care [facility]) - Progression Note    Patient Details  Name: Hunter Bailey MRN: [id number] Date of Birth: 09-Sep-1934  Transition of Care [facility]) CM/SW Contact  Chapman Fitch, RN Phone Number: 03/31/2023, 3:13 PM  Clinical Narrative:     Per MD she has discussed with niece [name] hospice services  Call placed to [name]. She confirms she would like to move forward with hospice, and does not have a preference of hospice agency  - Referral made to Ree Kida with [employer], contracting to see if he is a candidate for residential hospice - Secure email sent to [place] at the [facility] to see if it is an option for patient to return with hospice  - Spoke with Tammy at [facility].  They can not accept patient under his Medicaid with hospice  [name] was updated with the information above        Expected Discharge Plan and Services                                               Social Determinants of Health (SDOH) Interventions SDOH Screenings   Food Insecurity: Patient Unable To Answer (03/22/2023)  Housing: Patient Unable To Answer (03/22/2023)  Transportation Needs: Patient Unable To Answer (03/22/2023)  Utilities: Patient Unable To Answer (03/22/2023)  Social Connections: Unknown (03/22/2023)  Tobacco Use: Medium Risk (03/23/2023)    Readmission Risk Interventions     No data to display

## 2023-03-31 NOTE — Evaluation (Signed)
Clinical/Bedside Swallow Evaluation Patient Details  Name: Hunter Bailey MRN: [id number] Date of Birth: 07-29-1934  Today's Date: 03/31/2023 Time: SLP Start Time (ACUTE ONLY): 1143 SLP Stop Time (ACUTE ONLY): 1158 SLP Time Calculation (min) (ACUTE ONLY): 15 min  Past Medical History:  Past Medical History:  Diagnosis Date   Angina of effort (HCC)    BPH (benign prostatic hyperplasia)    Coronary artery disease    Encephalopathy    Hemiparesis (HCC)    Hyperlipidemia    Hypertension    MI (myocardial infarction) (HCC)    Stroke [facility])    Past Surgical History:  Past Surgical History:  Procedure Laterality Date   CORONARY/GRAFT ACUTE MI REVASCULARIZATION N/A 06/09/2021   Procedure: Coronary/Graft Acute MI Revascularization;  Surgeon: Marcina Millard, MD;  Location: [facility] INVASIVE CV LAB;  Service: Cardiovascular;  Laterality: N/A;   LEFT HEART CATH AND CORONARY ANGIOGRAPHY N/A 06/09/2021   Procedure: LEFT HEART CATH AND CORONARY ANGIOGRAPHY;  Surgeon: Marcina Millard, MD;  Location: [facility] INVASIVE CV LAB;  Service: Cardiovascular;  Laterality: N/A;   HPI:  89 y.o. male  with past medical history of CVA with left hemiparesis, BPH, CAD, HTN, HLD admitted on 03/18/2023 with decreased p.o. intake X 3 days and lethargy. Pt with Modified barium Swallow STudy on 03/21/2023 with recommend for strict aspiration precuations and dysphagia 1 diet with thin liquids via spoon, medicine crushed in puree. Per chart, pt with bone marrow bipsy on 03/28/2023, made NPO expereienced lethargy following procedure. ST services reconsulted for safe diet evaluation.    Assessment / Plan / Recommendation  Clinical Impression  At bedside, pt sleeping but wakes to his name and gentle touch. His speech continues to be chronically unintelligible but he was agreeable to consuming applesauce and water. After consuming 1 sip of thin liquids via spoon followed by one bite of applesauce, he began to frown and  appeared to cry out "no no no." He continues to present similar to abilities observed on the Modified Barium Swallow Study when consuming minimal intake with this Clinical research associate. At this time, would recommend pt returning to dysphagia 1 diet with thin liquids via spoon, medicine crushed with puree and STRICT ASPIRATION precautions. ST services are not likely to impact pt's ability to self-feed or increase consumption. ST services will sign off at this time. SLP Visit Diagnosis: Dysphagia, oropharyngeal phase (R13.12)    Aspiration Risk  Moderate aspiration risk;Severe aspiration risk;Risk for inadequate nutrition/hydration    Diet Recommendation Dysphagia 1 (Puree);Thin liquid    Liquid Administration via: Spoon Medication Administration: Crushed with puree Supervision: Full supervision/cueing for compensatory strategies;Staff to assist with self feeding Compensations: Minimize environmental distractions;Slow rate;Small sips/bites Postural Changes: Seated upright at 90 degrees;Remain upright for at least 30 minutes after po intake    Other  Recommendations Oral Care Recommendations: Oral care BID    Recommendations for follow up therapy are one component of a multi-disciplinary discharge planning process, led by the attending physician.  Recommendations may be updated based on patient status, additional functional criteria and insurance authorization.  Follow up Recommendations Follow physician's recommendations for discharge plan and follow up therapies      Assistance Recommended at Discharge  N/A  Functional Status Assessment Patient has not had a recent decline in their functional status  Frequency and Duration   N/A         Prognosis Prognosis for improved oropharyngeal function: Guarded Barriers to Reach Goals: Cognitive deficits;Motivation;Time post onset;Severity of deficits  Swallow Study   General Date of Onset: 03/31/23 (previously evaluated on 03/20/2023, initially admited  on 03/18/2023) HPI: 89 y.o. male  with past medical history of CVA with left hemiparesis, BPH, CAD, HTN, HLD admitted on 03/18/2023 with decreased p.o. intake X 3 days and lethargy. Pt with Modified barium Swallow STudy on 03/21/2023 with recommend for strict aspiration precuations and dysphagia 1 diet with thin liquids via spoon, medicine crushed in puree. Per chart, pt with bone marrow bipsy on 03/28/2023, made NPO expereienced lethargy following procedure. ST services reconsulted for safe diet evaluation. Type of Study: Bedside Swallow Evaluation Previous Swallow Assessment: MBSS 03/21/2023 Diet Prior to this Study: NPO Temperature Spikes Noted: No Respiratory Status: Room air History of Recent Intubation: No Behavior/Cognition: Alert;Distractible;Requires cueing;Doesn't follow directions Oral Cavity Assessment: Dry Oral Care Completed by SLP: Yes Oral Cavity - Dentition: Poor condition;Missing dentition Self-Feeding Abilities: Total assist Patient Positioning: Upright in bed Baseline Vocal Quality: Normal Volitional Cough: Cognitively unable to elicit Volitional Swallow: Unable to elicit    Oral/Motor/Sensory Function Overall Oral Motor/Sensory Function: Mild impairment   Ice Chips Ice chips: Not tested   Thin Liquid Thin Liquid: Within functional limits Presentation: Spoon    Nectar Thick Nectar Thick Liquid: Not tested   Honey Thick Honey Thick Liquid: Not tested   Puree Puree: Within functional limits (pt at chronic baseline) Presentation: Spoon   Solid     Solid: Not tested     Hunter Bailey B. Hunter Bailey, M.S., CCC-SLP, Tree surgeon Certified Brain Injury Specialist [facility]  [facility] Rehabilitation Services Office [phone] Ascom [phone] Fax [phone]

## 2023-03-31 NOTE — Progress Notes (Signed)
Hematology/Oncology Progress note [phone] [phone]     Patient Care Team: [facility] as PCP - General   Name of the patient: Hunter Bailey  [id number]  [birth year]  Date of visit: 03/31/23   INTERVAL HISTORY-   S/p bone marrow biopsy today.  Slightly more arousal, still lethargic.     Allergies  Allergen Reactions   Penicillins Other (See Comments)    Patient Active Problem List   Diagnosis Date Noted   Protein-calorie malnutrition, severe 03/30/2023   Hypercalcemia 03/18/2023   AKI (acute kidney injury) (HCC) 03/18/2023   Hypokalemia 03/18/2023   Hyponatremia 03/18/2023   Anemia associated with chemotherapy 03/18/2023   Acute ST elevation myocardial infarction (STEMI) involving left anterior descending (LAD) coronary artery (HCC) 06/09/2021   Chronic kidney disease, stage 3a (HCC) 06/09/2021   BPH (benign prostatic hyperplasia) 06/09/2021   Coronary disease 06/09/2021   Gastro-esophageal reflux disease without esophagitis 06/09/2021   Other vitamin B12 deficiency anemias 06/09/2021   Peripheral vascular disease (HCC) 06/09/2021   Type 2 diabetes mellitus without complications (HCC) 06/09/2021   Left hemiparesis (HCC) 01/08/2021     Past Medical History:  Diagnosis Date   Angina of effort (HCC)    BPH (benign prostatic hyperplasia)    Coronary artery disease    Encephalopathy    Hemiparesis (HCC)    Hyperlipidemia    Hypertension    MI (myocardial infarction) (HCC)    Stroke [facility])      Past Surgical History:  Procedure Laterality Date   CORONARY/GRAFT ACUTE MI REVASCULARIZATION N/A 06/09/2021   Procedure: Coronary/Graft Acute MI Revascularization;  Surgeon: Marcina Millard, MD;  Location: [facility] INVASIVE CV LAB;  Service: Cardiovascular;  Laterality: N/A;   LEFT HEART CATH AND CORONARY ANGIOGRAPHY N/A 06/09/2021   Procedure: LEFT HEART CATH AND CORONARY ANGIOGRAPHY;  Surgeon: Marcina Millard, MD;  Location: [facility]  INVASIVE CV LAB;  Service: Cardiovascular;  Laterality: N/A;    Social History   Socioeconomic History   Marital status: Divorced    Spouse name: Not on file   Number of children: Not on file   Years of education: Not on file   Highest education level: Not on file  Occupational History   Not on file  Tobacco Use   Smoking status: Former    Current packs/day: 0.00    Types: Cigarettes    Quit date: 2020    Years since quitting: 5.0    Passive exposure: Past   Smokeless tobacco: Never  Vaping Use   Vaping status: Never Used  Substance and Sexual Activity   Alcohol use: Not Currently   Drug use: Not Currently   Sexual activity: Not Currently  Other Topics Concern   Not on file  Social History Narrative   Not on file   Social Drivers of Health   Financial Resource Strain: Not on file  Food Insecurity: Patient Unable To Answer (03/22/2023)   Hunger Vital Sign    Worried About Running Out of Food in the Last Year: Patient unable to answer    Ran Out of Food in the Last Year: Patient unable to answer  Transportation Needs: Patient Unable To Answer (03/22/2023)   PRAPARE - Transportation    Lack of Transportation (Medical): Patient unable to answer    Lack of Transportation (Non-Medical): Patient unable to answer  Physical Activity: Not on file  Stress: Not on file  Social Connections: Unknown (03/22/2023)   Social Connection and Isolation Panel [NHANES]    Frequency  of Communication with Friends and Family: Patient unable to answer    Frequency of Social Gatherings with Friends and Family: Patient unable to answer    Attends Religious Services: Patient declined    Active Member of Clubs or Organizations: Patient declined    Attends Banker Meetings: Patient unable to answer    Marital Status: Patient unable to answer  Intimate Partner Violence: Patient Unable To Answer (03/22/2023)   Humiliation, Afraid, Rape, and Kick questionnaire    Fear of Current or  Ex-Partner: Patient unable to answer    Emotionally Abused: Patient unable to answer    Physically Abused: Patient unable to answer    Sexually Abused: Patient unable to answer     History reviewed. No pertinent family history.   Current Facility-Administered Medications:    acetaminophen (TYLENOL) tablet 650 mg, 650 mg, Oral, Q6H PRN **OR** acetaminophen (TYLENOL) suppository 650 mg, 650 mg, Rectal, Q6H PRN, Crosley, Debby, MD   alum & mag hydroxide-simeth (MAALOX/MYLANTA) 200-200-20 MG/5ML suspension 30 mL, 30 mL, Oral, Q6H PRN, Clide Dales, Narendranath, MD   atorvastatin (LIPITOR) tablet 20 mg, 20 mg, Oral, Daily, Djan, Prince T, MD, 20 mg at 03/31/23 3244   clopidogrel (PLAVIX) tablet 75 mg, 75 mg, Oral, Daily, Marcelino Duster, MD, 75 mg at 03/31/23 0102   cyanocobalamin (VITAMIN B12) tablet 1,000 mcg, 1,000 mcg, Oral, Daily, Rickard Patience, MD, 1,000 mcg at 03/31/23 0817   dextrose 5 % 1,000 mL with potassium chloride 40 mEq infusion, , Intravenous, Continuous, Agbata, Tochukwu, MD, Last Rate: 75 mL/hr at 03/31/23 1052, New Bag at 03/31/23 1052   dextrose 50 % solution 12.5 g, 12.5 g, Intravenous, Once PRN, Marcelino Duster, MD   feeding supplement (ENSURE ENLIVE / ENSURE PLUS) liquid 237 mL, 237 mL, Oral, TID BM, Sreeram, Narendranath, MD, 237 mL at 03/30/23 2148   heparin injection 5,000 Units, 5,000 Units, Subcutaneous, Q8H, Crosley, Debby, MD, 5,000 Units at 03/31/23 0535   hydrALAZINE (APRESOLINE) injection 10 mg, 10 mg, Intravenous, Q6H PRN, Agbata, Tochukwu, MD   latanoprost (XALATAN) 0.005 % ophthalmic solution 1 drop, 1 drop, Both Eyes, QHS, Sreeram, Narendranath, MD, 1 drop at 03/30/23 2148   ondansetron (ZOFRAN) tablet 4 mg, 4 mg, Oral, Q6H PRN **OR** ondansetron (ZOFRAN) injection 4 mg, 4 mg, Intravenous, Q6H PRN, Crosley, Debby, MD   Oral care mouth rinse, 15 mL, Mouth Rinse, PRN, Sreeram, Narendranath, MD   timolol (TIMOPTIC) 0.5 % ophthalmic solution 1 drop, 1 drop, Both  Eyes, BID, Sreeram, Narendranath, MD, 1 drop at 03/31/23 7253  Facility-Administered Medications Ordered in Other Encounters:    clopidogrel (PLAVIX) tablet, , , PRN, Paraschos, Alexander, MD, 600 mg at 06/09/21 2049   Physical exam:  Vitals:   03/31/23 0434 03/31/23 0443 03/31/23 0829 03/31/23 1100  BP: (!) 159/97  (!) 176/98 (!) 154/90  Pulse: 75  83 67  Resp: 19  16   Temp: 97.8 F (36.6 C)  97.8 F (36.6 C)   TempSrc: Oral  Oral   SpO2: 100%  98% 98%  Weight:  92 lb 9.5 oz (42 kg)    Height:       Physical Exam Constitutional:      Appearance: He is not diaphoretic.  HENT:     Head: Normocephalic and atraumatic.  Eyes:     General: No scleral icterus. Cardiovascular:     Rate and Rhythm: Normal rate.  Pulmonary:     Effort: Pulmonary effort is normal. No respiratory distress.  Abdominal:  General: There is no distension.     Palpations: Abdomen is soft.     Tenderness: There is no abdominal tenderness.  Musculoskeletal:        General: Normal range of motion.     Cervical back: Normal range of motion and neck supple.  Skin:    General: Skin is warm and dry.  Neurological:     Motor: No abnormal muscle tone.     Comments: sleepy and lethargic  Psychiatric:        Mood and Affect: Affect normal.       Labs    Latest Ref Rng & Units 03/31/2023    6:16 AM 03/30/2023    4:40 AM 03/29/2023    4:46 AM  CBC  WBC 4.0 - 10.5 K/uL 8.1  6.6  6.4   Hemoglobin 13.0 - 17.0 g/dL 8.7  8.3  8.3   Hematocrit 39.0 - 52.0 % 27.9  26.4  26.1   Platelets 150 - 400 K/uL 97  103  126       Latest Ref Rng & Units 03/31/2023    6:16 AM 03/30/2023    4:40 AM 03/29/2023    4:46 AM  CMP  Glucose 70 - 99 mg/dL 811  914  782   BUN 8 - 23 mg/dL 72  73  68   Creatinine 0.61 - 1.24 mg/dL 9.56  2.13  0.86   Sodium 135 - 145 mmol/L 153  149  143   Potassium 3.5 - 5.1 mmol/L 3.2  3.2  3.5   Chloride 98 - 111 mmol/L 129  125  116   CO2 22 - 32 mmol/L 22  20  22    Calcium 8.9 -  10.3 mg/dL 9.8  9.9  57.8   Total Protein 6.5 - 8.1 g/dL 46.9  62.9  52.8   Total Bilirubin 0.0 - 1.2 mg/dL 0.6  0.8  0.5   Alkaline Phos 38 - 126 U/L 30  31  31    AST 15 - 41 U/L 16  16  17    ALT 0 - 44 U/L 12  12  14       RADIOGRAPHIC STUDIES: I have personally reviewed the radiological images as listed and agreed with the findings in the report. MR BRAIN WO CONTRAST Result Date: 03/30/2023 CLINICAL DATA:  Acute neurologic deficit EXAM: MRI HEAD WITHOUT CONTRAST TECHNIQUE: Multiplanar, multiecho pulse sequences of the brain and surrounding structures were obtained without intravenous contrast. COMPARISON:  None Available. FINDINGS: Brain: No acute infarct, mass effect or extra-axial collection. Chronic blood products at the posterior right hemisphere. There is confluent hyperintense T2-weighted signal within the white matter. Generalized volume loss. Old posterior right MCA territory infarct old left frontal and parietal subcortical infarcts. The midline structures are normal. Vascular: Normal flow voids. Skull and upper cervical spine: Normal calvarium and skull base. Visualized upper cervical spine and soft tissues are normal. Sinuses/Orbits:No paranasal sinus fluid levels or advanced mucosal thickening. No mastoid or middle ear effusion. Normal orbits. IMPRESSION: 1. No acute intracranial abnormality. 2. Multiple old infarcts and sequelae of chronic small vessel disease. Electronically Signed   By: Deatra Robinson M.D.   On: 03/30/2023 04:00   CT HEAD WO CONTRAST ( ) Result Date: 03/29/2023 CLINICAL DATA:  Left hemiparesis EXAM: CT HEAD WITHOUT CONTRAST TECHNIQUE: Contiguous axial images were obtained from the base of the skull through the vertex without intravenous contrast. RADIATION DOSE REDUCTION: This exam was performed according to the departmental dose-optimization program which  includes automated exposure control, adjustment of the mA and/or kV according to patient size and/or use of  iterative reconstruction technique. COMPARISON:  None Available. FINDINGS: Brain: Hypodensity in the right posterior frontal and anterior parietal lobe, some of which appears chronic, but some of which is age indeterminate and may be acute to subacute, particularly posteriorly (series 4, image 50). Additional chronic encephalomalacia in the posterior left frontal lobe and right basal ganglia no evidence of acute hemorrhage, mass, mass effect, or midline shift. No hydrocephalus or extra-axial fluid collection. Vascular: No hyperdense vessel. Skull: Negative for fracture or focal lesion. Sinuses/Orbits: Minimal mucosal thickening in the ethmoid air cells. No acute finding in the orbits. Other: The mastoid air cells are well aerated. IMPRESSION: 1. Hypodensity in the right posterior frontal and anterior parietal lobe, some of which appears chronic, but some of which is age indeterminate and may be acute to subacute, particularly posteriorly. MRI is recommended for further evaluation. 2. Additional chronic encephalomalacia in the posterior left frontal lobe and right basal ganglia. These results will be called to the ordering clinician or representative by the Radiologist Assistant, and communication documented in the PACS or [employer]. Electronically Signed   By: Wiliam Ke M.D.   On: 03/29/2023 17:15   CT BONE MARROW BIOPSY & ASPIRATION Result Date: 03/28/2023 INDICATION: 89 year old male referred for bone marrow biopsy EXAM: CT BONE MARROW BIOPSY AND ASPIRATION MEDICATIONS: None. ANESTHESIA/SEDATION: Moderate (conscious) sedation was employed during this procedure. A total of Versed 0.5 mg and Fentanyl 25 mcg was administered intravenously. Moderate Sedation Time: 14 minutes. The patient's level of consciousness and vital signs were monitored continuously by radiology nursing throughout the procedure under my direct supervision. FLUOROSCOPY TIME:  CT COMPLICATIONS: None PROCEDURE: Informed written  consent was obtained from the patient's family after a thorough discussion of the procedural risks, benefits and alternatives. All questions were addressed. Maximal Sterile Barrier Technique was utilized including caps, mask, sterile gowns, sterile gloves, sterile drape, hand hygiene and skin antiseptic. A timeout was performed prior to the initiation of the procedure. Patient was positioned right decubitus on the CT gantry table, ultimately rolled into a right posterior oblique position. Scout CT of the pelvis was performed for surgical planning purposes. The posterior pelvis was prepped with Chlorhexidine in a sterile fashion, and a sterile drape was applied covering the operative field. A sterile gown and sterile gloves were used for the procedure. Local anesthesia was provided with 1% Lidocaine. Posterior right iliac bone was targeted for biopsy. The skin and subcutaneous tissues were infiltrated with 1% lidocaine without epinephrine. A small stab incision was made with an 11 blade scalpel, and an 11 gauge Murphy needle was advanced with CT guidance to the posterior cortex. Manual forced was used to advance the needle through the posterior cortex and the stylet was removed. A bone marrow aspirate was retrieved and passed to a cytotechnologist in the room. The Murphy needle was then advanced without the stylet for a core biopsy. The core biopsy was retrieved and also passed to a cytotechnologist. Manual pressure was used for hemostasis and a sterile dressing was placed. No complications were encountered no significant blood loss was encountered. Patient tolerated the procedure well and remained hemodynamically stable throughout. IMPRESSION: Status post image guided bone marrow biopsy. Signed, Yvone Neu. Miachel Roux, RPVI [facility] Electronically Signed   By: Gilmer Mor D.O.   On: 03/28/2023 13:02   DG Bone Survey Met Result Date:  03/24/2023 CLINICAL DATA:  Elevated immunoglobulin free light chain level. Hypercalcemia. EXAM: METASTATIC BONE SURVEY COMPARISON:  CT abdomen pelvis dated August 16, 2021. Chest x-ray dated June 09, 2021. FINDINGS: No sclerotic or lytic lesion in the visualized axial or appendicular skeleton. Degenerative changes of the spine, shoulders, hips, and knees. IMPRESSION: 1. No evident bony lesion. Electronically Signed   By: Obie Dredge M.D.   On: 03/24/2023 16:55   US RENAL Result Date: 03/23/2023 CLINICAL DATA:  Acute kidney injury EXAM: RENAL / URINARY TRACT ULTRASOUND COMPLETE COMPARISON:  Ultrasound 2015.  CT renal stone 08/16/2021. FINDINGS: Right Kidney: Renal measurements: 11.7 x 6.0 x 5.9 cm = volume: 215 mL. No collecting system dilatation or perinephric fluid. Upper pole renal cyst measuring 2.9 x 3.0 x 3.0 cm. Two thousand fifteen this has smaller at 15 mm. This has seen on prior CT as well. Left Kidney: Imaging of the left kidney. Patient declined additional images as per the sonographer of the left kidney due to pain. Bladder: Underdistended. Diffuse wall thickening. Large prostate. Prostate was not able to be measured by the sonographer at this time. Ureteral jets were seen. Other: None. IMPRESSION: Limited examination as the patient did not want to continue due to pain. Left kidney is not imaged. No right-sided renal collecting system dilatation. Persistent upper pole right-sided renal cysts. Enlarged prostate with mass effect along the base of the bladder. Bladder wall thickening and trabeculation. Electronically Signed   By: Karen Kays M.D.   On: 03/23/2023 16:59   DG Swallowing Func-Speech Pathology Result Date: 03/21/2023 Table formatting from the original result was not included. Modified Barium Swallow Study Patient Details Name: Zaedon Passanisi MRN: [id number] Date of Birth: [birth year] Today's Date: 03/21/2023 HPI/PMH: HPI: Pt is an 88yoM who [facility] to [facility] on 03/18/23 after 3 days of decreased  PO intake, pt now lethargic and appears dehydrated. Pt with elevated Ca++ 13.  Pt resides at the [place] of [address] PMH: CVA w/ Left hemiplegia.   CXR: No active disease.  Of Note: Pt indicated he had been being tx'd for UTI "problems" at the NH in the past month. Clinical Impression: Clinical Impression: Pt presents with moderate oropharyngeal dysphagia that places him at a high risk of aspiration when consuming thin liquids and nectar thick liquids via cup sips.     Pt's oral phase is c/b repetitive tongue movements during bolus propulsion and overall decreased mastication observed.     Pt's pharyngeal phase is c/b severely delayed swallow initiation with boluses resting in the pyriform sinuses for > 5 seconds. When consuming cup sips of thin liquids and nectar thick liquids, boluses spill into airway d/t larger bolus size and inability to contain them within his pyriform sinuses. Aspiration is reduced when consuming thin liquids and nectar thick liquids via spoon.     Given the above, recommend dysphagia 1 diet with thin liquids via spoon, medicine crushed in applesause with strict adherence to supervision and aspiration precautions. Factors that may increase risk of adverse event in presence of aspiration Rubye Oaks & Clearance Coots 2021): Factors that may increase risk of adverse event in presence of aspiration Rubye Oaks & Clearance Coots 2021): Reduced cognitive function; Limited mobility; Dependence for feeding and/or oral hygiene Recommendations/Plan: Swallowing Evaluation Recommendations Swallowing Evaluation Recommendations Recommendations: PO diet PO Diet Recommendation: Dysphagia 1 (Pureed); Thin liquids (Level 0) Liquid Administration via: Spoon Medication Administration: Crushed with puree Supervision: Staff to assist with self-feeding; Full supervision/cueing for swallowing strategies Swallowing strategies  : Minimize environmental distractions;  Slow rate; Small bites/sips Postural changes: Position pt fully upright for  meals; Stay upright 30-60 min after meals Oral care recommendations: Oral care BID (2x/day) Caregiver Recommendations: -- (TBD) Treatment Plan Treatment Plan Treatment recommendations: No treatment recommended at this time Follow-up recommendations: No SLP follow up Functional status assessment: Patient has had a recent decline in their functional status and/or demonstrates limited ability to make significant improvements in function in a reasonable and predictable amount of time. Recommendations Recommendations for follow up therapy are one component of a multi-disciplinary discharge planning process, led by the attending physician.  Recommendations may be updated based on patient status, additional functional criteria and insurance authorization. Assessment: Orofacial Exam: Orofacial Exam Oral Cavity: Oral Hygiene: WFL Oral Cavity - Dentition: Poor condition; Missing dentition Orofacial Anatomy: WFL Oral Motor/Sensory Function: Unable to test (left side facial weakness) Anatomy: Anatomy: WFL; Suspected cervical osteophytes Boluses Administered: Boluses Administered Boluses Administered: Thin liquids (Level 0); Mildly thick liquids (Level 2, nectar thick); Puree  Oral Impairment Domain: Oral Impairment Domain Lip Closure: Escape beyond mid-chin (on left) Tongue control during bolus hold: Not tested Bolus preparation/mastication: Minimal chewing/mashing with majority of bolus unchewed Bolus transport/lingual motion: Repetitive/disorganized tongue motion Oral residue: Trace residue lining oral structures Location of oral residue : Floor of mouth; Tongue; Palate Initiation of pharyngeal swallow : Pyriform sinuses  Pharyngeal Impairment Domain: Pharyngeal Impairment Domain Soft palate elevation: No bolus between soft palate (SP)/pharyngeal wall (PW) Laryngeal elevation: Partial superior movement of thyroid cartilage/partial approximation of arytenoids to epiglottic petiole Anterior hyoid excursion: Partial anterior  movement Epiglottic movement: Complete inversion Laryngeal vestibule closure: Incomplete, narrow column air/contrast in laryngeal vestibule Pharyngeal stripping wave : Present - complete Pharyngeal contraction (A/P view only): Complete Pharyngoesophageal segment opening: Complete distension and complete duration, no obstruction of flow Tongue base retraction: No contrast between tongue base and posterior pharyngeal wall (PPW) Pharyngeal residue: Complete pharyngeal clearance  Esophageal Impairment Domain: Esophageal Impairment Domain Esophageal clearance upright position: Complete clearance, esophageal coating Pill: No data recorded Penetration/Aspiration Scale Score: Penetration/Aspiration Scale Score 1.  Material does not enter airway: Thin liquids (Level 0); Mildly thick liquids (Level 2, nectar thick); Puree 8.  Material enters airway, passes BELOW cords without attempt by patient to eject out (silent aspiration) : Thin liquids (Level 0); Mildly thick liquids (Level 2, nectar thick) (vis cup sips) Compensatory Strategies: Compensatory Strategies Compensatory strategies: No   General Information: Caregiver present: No  Diet Prior to this Study: NPO   Temperature : Normal   Respiratory Status: WFL   Supplemental O2: None (Room air)   History of Recent Intubation: No  Behavior/Cognition: Alert; Distractible; Requires cueing; Doesn't follow directions Self-Feeding Abilities: Needs set-up for self-feeding; Needs assist with self-feeding; Needs hand-over-hand assist for feeding Baseline vocal quality/speech: Normal Volitional Cough: Unable to elicit Volitional Swallow: Unable to elicit Exam Limitations: No limitations Goal Planning: Prognosis for improved oropharyngeal function: Fair Barriers to Reach Goals: Cognitive deficits; Time post onset; Severity of deficits Barriers/Prognosis Comment: old R CVA; missing/poor Dentition; LUE weakness Patient/Family Stated Goal: none stated Consulted and agree with results and  recommendations: Patient; Physician; Nurse Pain: Pain Assessment Pain Assessment: Faces Faces Pain Scale: 4 Pain Location: R shoulder Pain Descriptors / Indicators: Aching; Sore Pain Intervention(s): Monitored during session; Repositioned End of Session: Start Time:SLP Start Time (ACUTE ONLY): 1135 Stop Time: SLP Stop Time (ACUTE ONLY): 1155 Time Calculation:SLP Time Calculation (min) (ACUTE ONLY): 20 min Charges: SLP Evaluations $ SLP Speech Visit: 1 Visit SLP Evaluations $BSS Swallow: 1  Procedure $MBS Swallow: 1 Procedure SLP visit diagnosis: SLP Visit Diagnosis: Dysphagia, oropharyngeal phase (R13.12) Past Medical History: Past Medical History: Diagnosis Date  Angina of effort (HCC)   BPH (benign prostatic hyperplasia)   Coronary artery disease   Encephalopathy   Hemiparesis (HCC)   Hyperlipidemia   Hypertension   MI (myocardial infarction) (HCC)   Stroke [facility])  Past Surgical History: Past Surgical History: Procedure Laterality Date  CORONARY/GRAFT ACUTE MI REVASCULARIZATION N/A 06/09/2021  Procedure: Coronary/Graft Acute MI Revascularization;  Surgeon: Marcina Millard, MD;  Location: [facility] INVASIVE CV LAB;  Service: Cardiovascular;  Laterality: N/A;  LEFT HEART CATH AND CORONARY ANGIOGRAPHY N/A 06/09/2021  Procedure: LEFT HEART CATH AND CORONARY ANGIOGRAPHY;  Surgeon: Marcina Millard, MD;  Location: [facility] INVASIVE CV LAB;  Service: Cardiovascular;  Laterality: N/A; Happi Overton 03/21/2023, 12:25 PM   Assessment and plan-   # Anemia, AKI, hypercalcemia, elevated kappa light chain with increased light chain ratio.  Protein electrophoresis is pending. Suspect underlying plasma cell disorder-i.e. multiple myeloma.   S/p bone marrow biopsy. Result is pending.    # Hypercalcemia calcium has improved after hydration.    PTH is decreased likely secondary hypercalcemia. Calcium level is increasing. Corrected calcium 12.9 Skeletal survey negative for bone lesions.  Continue hydration, calcitonin.  1/14  S/p Dexamethasone 20mg  x 1,  pamidronate x 1  Calcium has improved. Will give another dose of Dex 20mg  on 1/17  # AKI, pending above workup. 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 US renal showed no hydronephrosis.  Avoid nephrotoxins.   Empiric steroid. Kidney function improved.    # Anemia, Multifactorial.  Possibly due to underlying bone marrow involvement of plasma cell disorders. Hb has improved.  B12 is also borderline low.  continue B12 supplementation.  Patient has multiple comorbidity,likely will not tolereate aggressive chemotherapy. He may try low intensity treatment outpatient. Will discuss with family once bone marrow biopsy result is back.   Thank you for allowing me to participate in the care of this patient.   Rickard Patience, MD, PhD Hematology Oncology 03/31/2023

## 2023-04-01 DIAGNOSIS — C9000 Multiple myeloma not having achieved remission: Secondary | ICD-10-CM | POA: Diagnosis not present

## 2023-04-01 DIAGNOSIS — Z515 Encounter for palliative care: Secondary | ICD-10-CM | POA: Diagnosis not present

## 2023-04-01 LAB — CBC
HCT: 26.9 % — ABNORMAL LOW (ref 39.0–52.0)
Hemoglobin: 8.4 g/dL — ABNORMAL LOW (ref 13.0–17.0)
MCH: 31.5 pg (ref 26.0–34.0)
MCHC: 31.2 g/dL (ref 30.0–36.0)
MCV: 100.7 fL — ABNORMAL HIGH (ref 80.0–100.0)
Platelets: 95 10*3/uL — ABNORMAL LOW (ref 150–400)
RBC: 2.67 MIL/uL — ABNORMAL LOW (ref 4.22–5.81)
RDW: 17.8 % — ABNORMAL HIGH (ref 11.5–15.5)
WBC: 8.8 10*3/uL (ref 4.0–10.5)
nRBC: 0.2 % (ref 0.0–0.2)

## 2023-04-01 LAB — GLUCOSE, CAPILLARY
Glucose-Capillary: 174 mg/dL — ABNORMAL HIGH (ref 70–99)
Glucose-Capillary: 150 mg/dL — ABNORMAL HIGH (ref 70–99)
Glucose-Capillary: 94 mg/dL (ref 70–99)

## 2023-04-01 LAB — COMPREHENSIVE METABOLIC PANEL
ALT: 13 U/L (ref 0–44)
AST: 16 U/L (ref 15–41)
Albumin: 2 g/dL — ABNORMAL LOW (ref 3.5–5.0)
Alkaline Phosphatase: 29 U/L — ABNORMAL LOW (ref 38–126)
Anion gap: 3 — ABNORMAL LOW (ref 5–15)
BUN: 74 mg/dL — ABNORMAL HIGH (ref 8–23)
CO2: 19 mmol/L — ABNORMAL LOW (ref 22–32)
Calcium: 9.5 mg/dL (ref 8.9–10.3)
Chloride: 127 mmol/L — ABNORMAL HIGH (ref 98–111)
Creatinine, Ser: 2.08 mg/dL — ABNORMAL HIGH (ref 0.61–1.24)
GFR, Estimated: 30 mL/min — ABNORMAL LOW (ref >60)
Glucose, Bld: 184 mg/dL — ABNORMAL HIGH (ref 70–99)
Potassium: 3.9 mmol/L (ref 3.5–5.1)
Sodium: 149 mmol/L — ABNORMAL HIGH (ref 135–145)
Total Bilirubin: 0.6 mg/dL (ref 0.0–1.2)
Total Protein: 11.6 g/dL — ABNORMAL HIGH (ref 6.5–8.1)

## 2023-04-01 MED ORDER — DEXTROSE 5 % IV SOLN: INTRAVENOUS | Status: AC

## 2023-04-01 NOTE — Progress Notes (Signed)
Progress Note   Patient: [facility] Bailey JYN:[id number] DOB: Jul 20, 1934 DOA: 03/18/2023     14 DOS: the patient was seen and examined on 04/01/2023   Brief hospital course:  89 year old gentleman with past medical history significant for HLD, HTN, CAD, BPH, H/o CVA w/ left hemiparesis.  Patient resides in independent living facility.  He was brought in with decreased p.o. liquid intake for the past 3 days. He was lethargic on admission.    In the ER patient's vital stable.  Sodium 130, potassium 3.3, creatinine 2.67 baseline normal, total protein greater than 12, hemoglobin 9.7 [baseline 11.8, done 12/09/2022], platelets 114 [baseline normal]. Calcium elevated to 13. EKG: Atrial fibrillation HR 66, QTc 468.    Patient is admitted for hypercalcemia, started on IV fluids.  Patient's protein is elevated at 12, kidney function worse than baseline.  Oncology evaluated the patient for suspicion of multiple myeloma.  Patient got bone marrow biopsy 03/28/23. His calcium is high started IV hydration, Calcitonin. Pamidronate, steroids per oncology team     Assessment and Plan:  Hypercalcemia  Acute anemia AKI High suspicion for multiple myeloma Presented with weakness, lethargy.  Poor oral intake Oncology evaluation is appreciated.  LDH 124, beta-2 microglobulin level ordered.  Elevated kappa light chain with increased light chain ratio. Protein >12 - protein electrophoresis pending. Hb around 8.  Calcium 9.9 following IV hydration, calcitonin therapy, pamidronate and Decadron ordered by oncology team Bone marrow biopsy and aspiration done .  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  PERIPHERAL BLOOD:  -  Normochromic normocytic anemia with evidence of rouleaux.  -  Circulating plasma cells are approximately 5%.  Conventional cytogenetics and plasma cell  myeloma FISH are pending and  will be reported separately      AKI In the setting of possible multiple myeloma. Baseline serum creatinine of 1.20 from 09/24 and on admission it was 2.67 No significant improvement in renal function since admission Renal ultrasound showed enlarged prostate. Urine output ~1L. Continue gentle IV fluids as he is eating poor. Avoid nephrotoxic drugs.  Monitor daily renal function.     Hypokalemia  Hypernatremia Dehydration Patient noted to have very dry mucous membranes and poor skin turgor Continue free water to improve sodium levels Supplement potassium Continue to monitor daily electrolytes     Thrombocytopenia  Platelets on the lower side of normal but improved Monitor CBC closely and transfuse as indicated     CAD Continue atorvastatin.  Continue metoprolol, Plavix.     Hyperlipidemia History of CVA with left-sided hemiparesis continue Atorvastatin, Plavix.     Severe malnutrition In the context of chronic illness as evidenced by severe fat depletion, severe muscle depletion.  INTERVENTION:    Ensure Enlive po TID, each supplement provides 350 kcal and 20 grams of protein.   MVI po with diet advancement    Pt at high refeed risk; recommend monitor potassium, magnesium and phosphorus labs daily until stable   Daily weights        Failure to thrive Acute metabolic encephalopathy In the setting of hypercalcemia, AKI, possible multiple myeloma More awake and alert.  No verbal response Patient had a CT scan of the head without contrast which showed hypodensity in the right posterior frontal and anterior parietal lobe, some of which appears chronic, but some of which is age indeterminate and may be acute to subacute, particularly posteriorly. MRI is recommended for  further evaluation. 2. Additional chronic encephalomalacia in the posterior left frontal lobe and right basal ganglia MRI of the brain without contrast shows no acute intracranial abnormality.  Multiple old infarcts and sequelae of chronic small vessel disease. Appreciate speech therapy input, recommends a dysphagia 1 diet with thin liquids and strict aspiration precautions.  Patient requires assistance with meals Hospice evaluation            Subjective: Awake.  Unable to answer questions  Physical Exam: Vitals:   03/31/23 1708 03/31/23 2009 04/01/23 0838 04/01/23 0915  BP: (!) 142/95 (!) 150/87 (!) 165/81 (!) 158/86  Pulse: 80 82 64 61  Resp: 18 20 20 18   Temp:  97.6 F (36.4 C) (!) 97.4 F (36.3 C)   TempSrc:  Oral Axillary   SpO2: 96% 99% 98% 99%  Weight:      Height:       General - Elderly weak African American male, awake, opens eyes to verbal stimuli, very dry mucous membranes HEENT - PERRLA, EOMI, atraumatic head, non tender sinuses. Lung -bilateral air entry Heart - S1, S2 heard, no murmurs, rubs, 1+ pedal edema. Abdomen - Soft, non tender, bowel sounds good Neuro -generalized weakness Skin - Warm and dry   Data Reviewed: Labs reviewed.  Sodium 149, creatinine 2.08 There are no new results to review at this time.  Family Communication: None  Disposition: Status is: Inpatient Remains inpatient appropriate because: Discharge disposition  Planned Discharge Destination:  TBD    Time spent: 33 minutes  Author: Lucile Shutters, MD 04/01/2023 12:28 PM  For on call review [email or web address].

## 2023-04-01 NOTE — TOC Progression Note (Signed)
Transition of Care [facility]) - Progression Note    Patient Details  Name: Hunter Bailey MRN: [id number] Date of Birth: 09-03-1934  [facility]) CM/SW Contact  Liliana Cline, LCSW Phone Number: 04/01/2023, 2:56 PM  Clinical Narrative:    Patient does not qualify for Hospice Inpatient Unit at this time. CSW has reached out to Latta at [facility] again to inquire if patient can return there with hospice- awaiting reply from [place].        Expected Discharge Plan and Services                                               Social Determinants of Health (SDOH) Interventions SDOH Screenings   Food Insecurity: Patient Unable To Answer (03/22/2023)  Housing: Patient Unable To Answer (03/22/2023)  Transportation Needs: Patient Unable To Answer (03/22/2023)  Utilities: Patient Unable To Answer (03/22/2023)  Social Connections: Unknown (03/22/2023)  Tobacco Use: Medium Risk (03/23/2023)    Readmission Risk Interventions     No data to display

## 2023-04-01 NOTE — Progress Notes (Signed)
Daily Progress Note   Patient Name: Hunter Bailey       Date: 04/01/2023 DOB: 1934/03/22  Age: 89 y.o. MRN#: [id number] Attending Physician: Hunter Shutters, MD Primary Care Physician: [facility] Admit Date: 03/18/2023  Reason for Consultation/Follow-up: Establishing goals of care  HPI/Brief Hospital Review: 89 y.o. male  with past medical history of CVA with left hemiparesis, BPH, CAD, HTN, HLD admitted on 03/18/2023 with decreased p.o. intake X 3 days and lethargy.   Patient is a resident of [facility] [address] [facility].  Upon presentation to ED, calcium elevated to 13.     Oncology was consulted for suspicion of multiple myeloma.  03/28/2023, patient underwent bone marrow biopsy, results pending.   Patient admitted for hypercalcemia and treated with IV hydration, calcitonin, pamidronate, and steroids as per oncology team.   Remains encephalopathic, MRI 1/16 negative for acute intracranial findings   Palliative Medicine consulted for assisting with goals of care conversations.  Subjective: Extensive chart review has been completed prior to meeting patient including labs, vital signs, imaging, progress notes, orders, and available advanced directive documents from current and previous encounters.    Visited with Hunter Bailey at his bedside. He is resting in bed with eyes closed, he does not respond to calling of his name, attempts to open eyes with gentle sternal rub. Plan set to visit with niece around 11AM.  Returned to bedside at meeting time, no family at bedside. Hunter Bailey remains quite lethargic, same response as earlier to gentle sternal rub. Breakfast tray at bedside remains untouched.  Notified by nursing staff, niece visiting at bedside. Hunter Bailey and  nursing staff at bedside. Hunter Bailey able to open eyes to calling of his, unable to communicate but can nod head appropriately to simple questions. Easily drifts back to sleep without constant redirection. Per nursing staff this presentation has been consistent over last several days.  Hunter Bailey confirms her interest in pursuing hospice services, interested in local hospice home if eligible. Shared hospice liaison with plans to visit at bedside for IPU assessment around 12:30, if unable to remain at bedside assured Hunter Bailey conversations can be had via telephone.  Answered and addressed all questions and concerns. PMT to continue to follow for ongoing needs and support.  Care plan was discussed with primary team and nursing  staff.  Thank you for allowing the Palliative Medicine Team to assist in the care of this patient.  Total time:  35 minutes  Time spent includes: Detailed review of medical records (labs, imaging, vital signs), medically appropriate exam (mental status, respiratory, cardiac, skin), discussed with treatment team, counseling and educating patient, family and staff, documenting clinical information, medication management and coordination of care.  Hunter Deed, DNP, AGNP-C Palliative Medicine   Please contact Palliative Medicine Team phone at [phone] for questions and concerns.

## 2023-04-01 NOTE — Progress Notes (Signed)
[facility] Liaison Note  Asked by [facility] and MD yesterday to evaluate for IPU. Received report from [name] NP/PMT.  When I entered the room, no family present.  Mr. Faustini was sideways in the bed with 1 leg hanging off bed.  He was awake and responds to questions.  He had pulled his condom cath out.  RN helped me get patient pulled up in bed so I could offer nutrition as patient responded "yes" when I asked him if he was hungry.    Patient fed 1 teaspoon of applesauce, made a bad face, shook his head and said "no please." Patient did drink 3/4 bottle of a cold ensure.  Left patient lying in bed 45 degree angle.  Covered with dry blanket.  Nurse and tech were coming to change bed, clean patient up when I left.  Patient is appropriate for hospice but patient is not appropriate for hospice InPatient Unit [facility]).  Information above communicated with hospital medical care team and TOC- Meagan Hagwood. Please do not hesitate to call with any hospice related questions or concerns.  Norris Cross, RN Nurse Liaison [phone]

## 2023-04-02 DIAGNOSIS — Z515 Encounter for palliative care: Secondary | ICD-10-CM | POA: Diagnosis not present

## 2023-04-02 DIAGNOSIS — C9000 Multiple myeloma not having achieved remission: Secondary | ICD-10-CM | POA: Diagnosis not present

## 2023-04-02 DIAGNOSIS — E860 Dehydration: Secondary | ICD-10-CM | POA: Diagnosis not present

## 2023-04-02 LAB — BASIC METABOLIC PANEL
Anion gap: 3 — ABNORMAL LOW (ref 5–15)
BUN: 76 mg/dL — ABNORMAL HIGH (ref 8–23)
CO2: 20 mmol/L — ABNORMAL LOW (ref 22–32)
Calcium: 9.6 mg/dL (ref 8.9–10.3)
Chloride: 126 mmol/L — ABNORMAL HIGH (ref 98–111)
Creatinine, Ser: 1.81 mg/dL — ABNORMAL HIGH (ref 0.61–1.24)
GFR, Estimated: 36 mL/min — ABNORMAL LOW (ref >60)
Glucose, Bld: 121 mg/dL — ABNORMAL HIGH (ref 70–99)
Potassium: 3.4 mmol/L — ABNORMAL LOW (ref 3.5–5.1)
Sodium: 149 mmol/L — ABNORMAL HIGH (ref 135–145)

## 2023-04-02 LAB — GLUCOSE, CAPILLARY
Glucose-Capillary: 102 mg/dL — ABNORMAL HIGH (ref 70–99)
Glucose-Capillary: 112 mg/dL — ABNORMAL HIGH (ref 70–99)
Glucose-Capillary: 113 mg/dL — ABNORMAL HIGH (ref 70–99)
Glucose-Capillary: 113 mg/dL — ABNORMAL HIGH (ref 70–99)

## 2023-04-02 MED ORDER — POTASSIUM CHLORIDE 2 MEQ/ML IV SOLN
INTRAVENOUS | Status: AC
  Filled 2023-04-02: qty 1000

## 2023-04-02 NOTE — Progress Notes (Addendum)
[facility] Liaison Note  Follow up on new referral for hospice upon d/c from [facility]. Patient has been evaluated several times for Hospice InPatient Unit [facility]) and is not appropriate.  Awaiting bed offers from LTC. Plan will be to receive hospice services at LTC once a bed is found.  Hospital Liaison Team will continue to follow.  Please do not hesitate to call with any hospice related questions or concerns.  Norris Cross, RN Nurse Liaison  [phone]

## 2023-04-02 NOTE — Progress Notes (Signed)
                                                                                                                                                                                                           Daily Progress Note   Patient Name: Hunter Bailey       Date: 04/02/2023 DOB: 06-20-1934  Age: 89 y.Bailey. MRN#: [id number] Attending Physician: Hunter Shutters, MD Primary Care Physician: Hunter Bailey Admit Date: 03/18/2023  Reason for Consultation/Follow-up: Establishing goals of care  HPI/Brief Hospital Review: 89 y.Bailey. male  with past medical history of CVA with left hemiparesis, BPH, CAD, HTN, HLD admitted on 03/18/2023 with decreased p.Bailey. intake X 3 days and lethargy.   Patient is a resident of [facility].  Upon presentation to ED, calcium elevated to 13.     Oncology was consulted for suspicion of multiple myeloma.  03/28/2023, patient underwent bone marrow biopsy, results pending.   Patient admitted for hypercalcemia and treated with IV hydration, calcitonin, pamidronate, and steroids as per oncology team.   Remains encephalopathic, MRI 1/16 negative for acute intracranial findings   Palliative Medicine consulted for assisting with goals of care conversations.  Subjective: Extensive chart review has been completed prior to meeting patient including labs, vital signs, imaging, progress notes, orders, and available advanced directive documents from current and previous encounters.    Visited with Hunter Bailey at his bedside. He is resting in bed with eyes open, he is able to answer simple "yes or no" questions but unable to answer orientation questions. He denies acute pain or discomfort, no signs of discomfort. Friend visiting at bedside.  Per chart review, plan remains for LTC facility placement with hospice following as he is not IPU appropriate at this time. Hospice liaison and TOC engaged.  No ongoing acute palliative needs. PMT will step  back from daily visits but remains available to address ongoing needs and concerns as they arise.  Thank you for allowing [facility] Palliative Medicine Team to assist in [facility] care of this patient.  Total time:  25 minutes  Time spent includes: Detailed review of medical records (labs, imaging, vital signs), medically appropriate exam (mental status, respiratory, cardiac, skin), discussed with treatment team, counseling and educating patient, family and staff, documenting clinical information, medication management and coordination of care.  Hunter Deed, DNP, AGNP-C Palliative Medicine   Please contact Palliative Medicine Team phone at [phone] for questions and concerns.

## 2023-04-02 NOTE — Progress Notes (Signed)
Progress Note   Patient: Hunter Bailey OZD:[id number] DOB: 09-23-1934 DOA: 03/18/2023     15 DOS: the patient was seen and examined on 04/02/2023   Brief hospital course: 89 year old gentleman with past medical history significant for HLD, HTN, CAD, BPH, H/o CVA w/ left hemiparesis.  Patient resides in independent living facility.  He was brought in with decreased p.o. liquid intake for the past 3 days. He was lethargic on admission.    In the ER patient's vital stable.  Sodium 130, potassium 3.3, creatinine 2.67 baseline normal, total protein greater than 12, hemoglobin 9.7 [baseline 11.8, done 12/09/2022], platelets 114 [baseline normal]. Calcium elevated to 13. EKG: Atrial fibrillation HR 66, QTc 468.    Patient is admitted for hypercalcemia, started on IV fluids.  Patient's protein is elevated at 12, kidney function worse than baseline.  Oncology evaluated the patient for suspicion of multiple myeloma.  Patient got bone marrow biopsy 03/28/23. His calcium is high started IV hydration, Calcitonin. Pamidronate, steroids per oncology team     Assessment and Plan:  Hypercalcemia  Acute anemia AKI High suspicion for multiple myeloma Presented with weakness, lethargy.  Poor oral intake Oncology evaluation is appreciated.  LDH 124, beta-2 microglobulin level ordered.  Elevated kappa light chain with increased light chain ratio. Protein >12 - protein electrophoresis pending. Hb around 8.  Calcium 9.9 following IV hydration, calcitonin therapy, pamidronate and Decadron ordered by oncology team Bone marrow biopsy and aspiration done .  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  PERIPHERAL BLOOD:  -  Normochromic normocytic anemia with evidence of rouleaux.  -  Circulating plasma cells are approximately 5%.  Conventional cytogenetics and plasma cell  myeloma FISH are pending and will  be reported separately      AKI In the setting of possible multiple myeloma. Baseline serum creatinine of 1.20 from 09/24 and on admission it was 2.67 Renal function has improved but not back to baseline Renal ultrasound showed enlarged prostate. Urine output ~1L. Continue gentle IV fluids as he is eating poor. Avoid nephrotoxic drugs.  Monitor daily renal function.     Hypokalemia  Hypernatremia Dehydration Patient noted to have very dry mucous membranes and poor skin turgor Continue free water to improve sodium levels Supplement potassium Continue to monitor daily electrolytes     Thrombocytopenia  Platelets on the lower side of normal but improved Monitor CBC closely and transfuse as indicated     CAD Continue atorvastatin.  Continue metoprolol, Plavix.     Hyperlipidemia History of CVA with left-sided hemiparesis continue Atorvastatin, Plavix.     Severe malnutrition In the context of chronic illness as evidenced by severe fat depletion, severe muscle depletion.  INTERVENTION:    Ensure Enlive po TID, each supplement provides 350 kcal and 20 grams of protein.   MVI po with diet advancement    Pt at high refeed risk; recommend monitor potassium, magnesium and phosphorus labs daily until stable   Daily weights        Failure to thrive Acute metabolic encephalopathy In the setting of hypercalcemia, AKI, possible multiple myeloma More awake and alert.  No verbal response Patient had a CT scan of the head without contrast which showed hypodensity in the right posterior frontal and anterior parietal lobe, some of which appears chronic, but some of which is age indeterminate and may be acute to subacute, particularly posteriorly. MRI is recommended for  further evaluation. 2. Additional chronic encephalomalacia in the posterior left frontal lobe and right basal ganglia MRI of the brain without contrast shows no acute intracranial abnormality. Multiple old  infarcts and sequelae of chronic small vessel disease. Appreciate speech therapy input, recommends a dysphagia 1 diet with thin liquids and strict aspiration precautions.  Patient requires assistance with meals Appreciate hospice evaluation.  Does not meet criteria for inpatient hospice Possible discharge to skilled nursing facility with comfort measures            Subjective: Opens eyes to name call  Physical Exam: Vitals:   04/01/23 0915 04/01/23 2023 04/02/23 0424 04/02/23 0857  BP: (!) 158/86 (!) 148/86 (!) 169/92 (!) 156/87  Pulse: 61 71 61 69  Resp: 18 20 16 16   Temp:  97.7 F (36.5 C)    TempSrc:  Oral    SpO2: 99% 100% 100% 100%  Weight:      Height:       General - Elderly weak African American male, awake, opens eyes to verbal stimuli, very dry mucous membranes HEENT - PERRLA, EOMI, atraumatic head, non tender sinuses. Lung -bilateral air entry Heart - S1, S2 heard, no murmurs, rubs, 1+ pedal edema. Abdomen - Soft, non tender, bowel sounds good Neuro -generalized weakness Skin - Warm and dry     Data Reviewed: Labs reviewed.  Potassium 3.4, sodium 149 There are no new results to review at this time.  Family Communication: None  Disposition: Status is: Inpatient Remains inpatient appropriate because: Discharge disposition  Planned Discharge Destination: Skilled nursing facility    Time spent: 33 minutes  Author: Lucile Shutters, MD 04/02/2023 12:59 PM  For on call review [email or web address].

## 2023-04-02 NOTE — TOC Progression Note (Addendum)
Transition of Care [facility]) - Progression Note    Patient Details  Name: Hunter Bailey MRN: [id number] Date of Birth: 02-26-1935  Transition [facility] Care [facility]) CM/SW Contact  Liliana Cline, LCSW Phone Number: 04/02/2023, 9:36 AM  Clinical Narrative:    CSW reached out to the SNFs that made bed offers to inquire if they can take LTC with hospice: -Lewayne Bunting - asked Revonda Standard - she will check -Phineas Semen- asked [place] - no male LTC beds -[facility]- asked Clide Cliff - he will check tomorrow -Cheryln Manly- asked [facility] Pound - awaiting response.        Expected Discharge Plan and Services                                               Social Determinants of Health (SDOH) Interventions SDOH Screenings   Food Insecurity: Patient Unable To Answer (03/22/2023)  Housing: Patient Unable To Answer (03/22/2023)  Transportation Needs: Patient Unable To Answer (03/22/2023)  Utilities: Patient Unable To Answer (03/22/2023)  Social Connections: Unknown (03/22/2023)  Tobacco Use: Medium Risk (03/23/2023)    Readmission Risk Interventions     No data to display

## 2023-04-02 NOTE — Plan of Care (Signed)
  Problem: Clinical Measurements: Goal: Will remain free from infection Outcome: Progressing   Problem: Coping: Goal: Level of anxiety will decrease Outcome: Progressing   

## 2023-04-03 DIAGNOSIS — B370 Candidal stomatitis: Secondary | ICD-10-CM

## 2023-04-03 DIAGNOSIS — C9000 Multiple myeloma not having achieved remission: Secondary | ICD-10-CM | POA: Diagnosis not present

## 2023-04-03 DIAGNOSIS — D518 Other vitamin B12 deficiency anemias: Secondary | ICD-10-CM | POA: Diagnosis not present

## 2023-04-03 DIAGNOSIS — E43 Unspecified severe protein-calorie malnutrition: Secondary | ICD-10-CM

## 2023-04-03 LAB — BASIC METABOLIC PANEL
Anion gap: 1 — ABNORMAL LOW (ref 5–15)
BUN: 71 mg/dL — ABNORMAL HIGH (ref 8–23)
CO2: 20 mmol/L — ABNORMAL LOW (ref 22–32)
Calcium: 9.3 mg/dL (ref 8.9–10.3)
Chloride: 123 mmol/L — ABNORMAL HIGH (ref 98–111)
Creatinine, Ser: 1.67 mg/dL — ABNORMAL HIGH (ref 0.61–1.24)
GFR, Estimated: 40 mL/min — ABNORMAL LOW (ref >60)
Glucose, Bld: 116 mg/dL — ABNORMAL HIGH (ref 70–99)
Potassium: 3.6 mmol/L (ref 3.5–5.1)
Sodium: 144 mmol/L (ref 135–145)

## 2023-04-03 LAB — GLUCOSE, CAPILLARY
Glucose-Capillary: 107 mg/dL — ABNORMAL HIGH (ref 70–99)
Glucose-Capillary: 95 mg/dL (ref 70–99)
Glucose-Capillary: 97 mg/dL (ref 70–99)
Glucose-Capillary: 97 mg/dL (ref 70–99)
Glucose-Capillary: 98 mg/dL (ref 70–99)

## 2023-04-03 MED ORDER — CLOTRIMAZOLE 10 MG MT TROC
10.0000 mg | Freq: Every day | OROMUCOSAL | Status: DC
  Administered 2023-04-03 – 2023-04-05 (×8): 10 mg via ORAL
  Filled 2023-04-03 (×13): qty 1

## 2023-04-03 NOTE — Progress Notes (Addendum)
Hematology/Oncology Progress note [phone] [phone]     Patient Care Team: [facility] as PCP - General   Name of the patient: Hunter Bailey  [id number]  1934/07/10  Date of visit: 04/03/23   INTERVAL HISTORY-   Bone marrow biopsy results confirmed diagnosis of multiple myeloma, involving 80%.  Slightly more arousal, still lethargic. [name]     Allergies  Allergen Reactions   Penicillins Other (See Comments)    Patient Active Problem List   Diagnosis Date Noted   Multiple myeloma not having achieved remission (HCC) 03/31/2023   Dehydration 03/31/2023   Protein-calorie malnutrition, severe 03/30/2023   Hypercalcemia 03/18/2023   AKI (acute kidney injury) (HCC) 03/18/2023   Hypokalemia 03/18/2023   Hyponatremia 03/18/2023   Anemia associated with chemotherapy 03/18/2023   Acute ST elevation myocardial infarction (STEMI) involving left anterior descending (LAD) coronary artery (HCC) 06/09/2021   Chronic kidney disease, stage 3a (HCC) 06/09/2021   BPH (benign prostatic hyperplasia) 06/09/2021   Coronary disease 06/09/2021   Gastro-esophageal reflux disease without esophagitis 06/09/2021   Other vitamin B12 deficiency anemias 06/09/2021   Peripheral vascular disease (HCC) 06/09/2021   Type 2 diabetes mellitus without complications (HCC) 06/09/2021   Left hemiparesis (HCC) 01/08/2021     Past Medical History:  Diagnosis Date   Angina of effort (HCC)    BPH (benign prostatic hyperplasia)    Coronary artery disease    Encephalopathy    Hemiparesis (HCC)    Hyperlipidemia    Hypertension    MI (myocardial infarction) (HCC)    Stroke [facility])      Past Surgical History:  Procedure Laterality Date   CORONARY/GRAFT ACUTE MI REVASCULARIZATION N/A 06/09/2021   Procedure: Coronary/Graft Acute MI Revascularization;  Surgeon: Marcina Millard, MD;  Location: [facility] INVASIVE CV LAB;  Service: Cardiovascular;  Laterality: N/A;   LEFT HEART CATH  AND CORONARY ANGIOGRAPHY N/A 06/09/2021   Procedure: LEFT HEART CATH AND CORONARY ANGIOGRAPHY;  Surgeon: Marcina Millard, MD;  Location: [facility] INVASIVE CV LAB;  Service: Cardiovascular;  Laterality: N/A;    Social History   Socioeconomic History   Marital status: Divorced    Spouse name: Not on file   Number of children: Not on file   Years of education: Not on file   Highest education level: Not on file  Occupational History   Not on file  Tobacco Use   Smoking status: Former    Current packs/day: 0.00    Types: Cigarettes    Quit date: 2020    Years since quitting: 5.0    Passive exposure: Past   Smokeless tobacco: Never  Vaping Use   Vaping status: Never Used  Substance and Sexual Activity   Alcohol use: Not Currently   Drug use: Not Currently   Sexual activity: Not Currently  Other Topics Concern   Not on file  Social History Narrative   Not on file   Social Drivers of Health   Financial Resource Strain: Not on file  Food Insecurity: Patient Unable To Answer (03/22/2023)   Hunger Vital Sign    Worried About Running Out of Food in the Last Year: Patient unable to answer    Ran Out of Food in the Last Year: Patient unable to answer  Transportation Needs: Patient Unable To Answer (03/22/2023)   PRAPARE - Transportation    Lack of Transportation (Medical): Patient unable to answer    Lack of Transportation (Non-Medical): Patient unable to answer  Physical Activity: Not on file  Stress: Not on file  Social Connections: Unknown (03/22/2023)   Social Connection and Isolation Panel [NHANES]    Frequency of Communication with Friends and Family: Patient unable to answer    Frequency of Social Gatherings with Friends and Family: Patient unable to answer    Attends Religious Services: Patient declined    Active Member of Clubs or Organizations: Patient declined    Attends Banker Meetings: Patient unable to answer    Marital Status: Patient unable to answer   Intimate Partner Violence: Patient Unable To Answer (03/22/2023)   Humiliation, Afraid, Rape, and Kick questionnaire    Fear of Current or Ex-Partner: Patient unable to answer    Emotionally Abused: Patient unable to answer    Physically Abused: Patient unable to answer    Sexually Abused: Patient unable to answer     History reviewed. No pertinent family history.   Current Facility-Administered Medications:    acetaminophen (TYLENOL) tablet 650 mg, 650 mg, Oral, Q6H PRN, 650 mg at 04/02/23 1059 **OR** acetaminophen (TYLENOL) suppository 650 mg, 650 mg, Rectal, Q6H PRN, Crosley, Debby, MD   alum & mag hydroxide-simeth (MAALOX/MYLANTA) 200-200-20 MG/5ML suspension 30 mL, 30 mL, Oral, Q6H PRN, Clide Dales, Narendranath, MD   atorvastatin (LIPITOR) tablet 20 mg, 20 mg, Oral, Daily, Djan, Prince T, MD, 20 mg at 04/03/23 2952   clopidogrel (PLAVIX) tablet 75 mg, 75 mg, Oral, Daily, Marcelino Duster, MD, 75 mg at 04/03/23 8413   cyanocobalamin (VITAMIN B12) tablet 1,000 mcg, 1,000 mcg, Oral, Daily, Rickard Patience, MD, 1,000 mcg at 04/03/23 0928   dextrose 50 % solution 12.5 g, 12.5 g, Intravenous, Once PRN, Marcelino Duster, MD   feeding supplement (ENSURE ENLIVE / ENSURE PLUS) liquid 237 mL, 237 mL, Oral, TID BM, Sreeram, Narendranath, MD, 237 mL at 04/03/23 0928   heparin injection 5,000 Units, 5,000 Units, Subcutaneous, Q8H, Crosley, Debby, MD, 5,000 Units at 04/03/23 0507   hydrALAZINE (APRESOLINE) injection 10 mg, 10 mg, Intravenous, Q6H PRN, Agbata, Tochukwu, MD   latanoprost (XALATAN) 0.005 % ophthalmic solution 1 drop, 1 drop, Both Eyes, QHS, Sreeram, Narendranath, MD, 1 drop at 04/01/23 2112   ondansetron (ZOFRAN) tablet 4 mg, 4 mg, Oral, Q6H PRN **OR** ondansetron (ZOFRAN) injection 4 mg, 4 mg, Intravenous, Q6H PRN, Crosley, Debby, MD   Oral care mouth rinse, 15 mL, Mouth Rinse, PRN, Sreeram, Narendranath, MD   timolol (TIMOPTIC) 0.5 % ophthalmic solution 1 drop, 1 drop, Both Eyes, BID,  Sreeram, Narendranath, MD, 1 drop at 04/02/23 1100  Facility-Administered Medications Ordered in Other Encounters:    clopidogrel (PLAVIX) tablet, , , PRN, Paraschos, Alexander, MD, 600 mg at 06/09/21 2049   Physical exam:  Vitals:   04/02/23 1724 04/02/23 2022 04/03/23 0506 04/03/23 0815  BP: 134/70 (!) 147/70 (!) 150/76 (!) 140/80  Pulse: 62 69 72 74  Resp: 16 18 18 16   Temp:  98.9 F (37.2 C)  98 F (36.7 C)  TempSrc:  Oral  Axillary  SpO2: 100% 100% 99% 100%  Weight:      Height:       Physical Exam Constitutional:      Appearance: He is not diaphoretic.  HENT:     Head: Normocephalic and atraumatic.     Mouth/Throat:     Comments: White plaque Eyes:     General: No scleral icterus. Cardiovascular:     Rate and Rhythm: Normal rate.  Pulmonary:     Effort: Pulmonary effort is normal. No respiratory distress.  Abdominal:  General: There is no distension.     Palpations: Abdomen is soft.     Tenderness: There is no abdominal tenderness.  Musculoskeletal:        General: Normal range of motion.     Cervical back: Normal range of motion and neck supple.  Skin:    General: Skin is warm and dry.  Neurological:     Motor: No abnormal muscle tone.     Comments: Lethargic, slightly more arousal.   Psychiatric:        Mood and Affect: Affect normal.       Labs    Latest Ref Rng & Units 04/01/2023    4:23 AM 03/31/2023    6:16 AM 03/30/2023    4:40 AM  CBC  WBC 4.0 - 10.5 K/uL 8.8  8.1  6.6   Hemoglobin 13.0 - 17.0 g/dL 8.4  8.7  8.3   Hematocrit 39.0 - 52.0 % 26.9  27.9  26.4   Platelets 150 - 400 K/uL 95  97  103       Latest Ref Rng & Units 04/03/2023    5:22 AM 04/02/2023    5:47 AM 04/01/2023    4:23 AM  CMP  Glucose 70 - 99 mg/dL 213  086  578   BUN 8 - 23 mg/dL 71  76  74   Creatinine 0.61 - 1.24 mg/dL 4.69  6.29  5.28   Sodium 135 - 145 mmol/L 144  149  149   Potassium 3.5 - 5.1 mmol/L 3.6  3.4  3.9   Chloride 98 - 111 mmol/L 123  126  127   CO2  22 - 32 mmol/L 20  20  19    Calcium 8.9 - 10.3 mg/dL 9.3  9.6  9.5   Total Protein 6.5 - 8.1 g/dL   41.3   Total Bilirubin 0.0 - 1.2 mg/dL   0.6   Alkaline Phos 38 - 126 U/L   29   AST 15 - 41 U/L   16   ALT 0 - 44 U/L   13      RADIOGRAPHIC STUDIES: I have personally reviewed the radiological images as listed and agreed with the findings in the report. MR BRAIN WO CONTRAST Result Date: 03/30/2023 CLINICAL DATA:  Acute neurologic deficit EXAM: MRI HEAD WITHOUT CONTRAST TECHNIQUE: Multiplanar, multiecho pulse sequences of the brain and surrounding structures were obtained without intravenous contrast. COMPARISON:  None Available. FINDINGS: Brain: No acute infarct, mass effect or extra-axial collection. Chronic blood products at the posterior right hemisphere. There is confluent hyperintense T2-weighted signal within the white matter. Generalized volume loss. Old posterior right MCA territory infarct old left frontal and parietal subcortical infarcts. The midline structures are normal. Vascular: Normal flow voids. Skull and upper cervical spine: Normal calvarium and skull base. Visualized upper cervical spine and soft tissues are normal. Sinuses/Orbits:No paranasal sinus fluid levels or advanced mucosal thickening. No mastoid or middle ear effusion. Normal orbits. IMPRESSION: 1. No acute intracranial abnormality. 2. Multiple old infarcts and sequelae of chronic small vessel disease. Electronically Signed   By: Deatra Robinson M.D.   On: 03/30/2023 04:00   CT HEAD WO CONTRAST ( ) Result Date: 03/29/2023 CLINICAL DATA:  Left hemiparesis EXAM: CT HEAD WITHOUT CONTRAST TECHNIQUE: Contiguous axial images were obtained from the base of the skull through the vertex without intravenous contrast. RADIATION DOSE REDUCTION: This exam was performed according to the departmental dose-optimization program which includes automated exposure control, adjustment of the mA  and/or kV according to patient size and/or use  of iterative reconstruction technique. COMPARISON:  None Available. FINDINGS: Brain: Hypodensity in the right posterior frontal and anterior parietal lobe, some of which appears chronic, but some of which is age indeterminate and may be acute to subacute, particularly posteriorly (series 4, image 50). Additional chronic encephalomalacia in the posterior left frontal lobe and right basal ganglia no evidence of acute hemorrhage, mass, mass effect, or midline shift. No hydrocephalus or extra-axial fluid collection. Vascular: No hyperdense vessel. Skull: Negative for fracture or focal lesion. Sinuses/Orbits: Minimal mucosal thickening in the ethmoid air cells. No acute finding in the orbits. Other: The mastoid air cells are well aerated. IMPRESSION: 1. Hypodensity in the right posterior frontal and anterior parietal lobe, some of which appears chronic, but some of which is age indeterminate and may be acute to subacute, particularly posteriorly. MRI is recommended for further evaluation. 2. Additional chronic encephalomalacia in the posterior left frontal lobe and right basal ganglia. These results will be called to the ordering clinician or representative by the Radiologist Assistant, and communication documented in the PACS or [employer]. Electronically Signed   By: Wiliam Ke M.D.   On: 03/29/2023 17:15   CT BONE MARROW BIOPSY & ASPIRATION Result Date: 03/28/2023 INDICATION: 89 year old male referred for bone marrow biopsy EXAM: CT BONE MARROW BIOPSY AND ASPIRATION MEDICATIONS: None. ANESTHESIA/SEDATION: Moderate (conscious) sedation was employed during this procedure. A total of Versed 0.5 mg and Fentanyl 25 mcg was administered intravenously. Moderate Sedation Time: 14 minutes. The patient's level of consciousness and vital signs were monitored continuously by radiology nursing throughout the procedure under my direct supervision. FLUOROSCOPY TIME:  CT COMPLICATIONS: None PROCEDURE: Informed written  consent was obtained from the patient's family after a thorough discussion of the procedural risks, benefits and alternatives. All questions were addressed. Maximal Sterile Barrier Technique was utilized including caps, mask, sterile gowns, sterile gloves, sterile drape, hand hygiene and skin antiseptic. A timeout was performed prior to the initiation of the procedure. Patient was positioned right decubitus on the CT gantry table, ultimately rolled into a right posterior oblique position. Scout CT of the pelvis was performed for surgical planning purposes. The posterior pelvis was prepped with Chlorhexidine in a sterile fashion, and a sterile drape was applied covering the operative field. A sterile gown and sterile gloves were used for the procedure. Local anesthesia was provided with 1% Lidocaine. Posterior right iliac bone was targeted for biopsy. The skin and subcutaneous tissues were infiltrated with 1% lidocaine without epinephrine. A small stab incision was made with an 11 blade scalpel, and an 11 gauge Murphy needle was advanced with CT guidance to the posterior cortex. Manual forced was used to advance the needle through the posterior cortex and the stylet was removed. A bone marrow aspirate was retrieved and passed to a cytotechnologist in the room. The Murphy needle was then advanced without the stylet for a core biopsy. The core biopsy was retrieved and also passed to a cytotechnologist. Manual pressure was used for hemostasis and a sterile dressing was placed. No complications were encountered no significant blood loss was encountered. Patient tolerated the procedure well and remained hemodynamically stable throughout. IMPRESSION: Status post image guided bone marrow biopsy. Signed, Yvone Neu. Miachel Roux, RPVI [facility] Radiology Electronically Signed   By: Gilmer Mor D.O.   On: 03/28/2023 13:02   DG Bone Survey Met Result Date:  03/24/2023 CLINICAL DATA:  Elevated immunoglobulin free light  chain level. Hypercalcemia. EXAM: METASTATIC BONE SURVEY COMPARISON:  CT abdomen pelvis dated August 16, 2021. Chest x-ray dated June 09, 2021. FINDINGS: No sclerotic or lytic lesion in the visualized axial or appendicular skeleton. Degenerative changes of the spine, shoulders, hips, and knees. IMPRESSION: 1. No evident bony lesion. Electronically Signed   By: Obie Dredge M.D.   On: 03/24/2023 16:55   US RENAL Result Date: 03/23/2023 CLINICAL DATA:  Acute kidney injury EXAM: RENAL / URINARY TRACT ULTRASOUND COMPLETE COMPARISON:  Ultrasound 2015.  CT renal stone 08/16/2021. FINDINGS: Right Kidney: Renal measurements: 11.7 x 6.0 x 5.9 cm = volume: 215 mL. No collecting system dilatation or perinephric fluid. Upper pole renal cyst measuring 2.9 x 3.0 x 3.0 cm. Two thousand fifteen this has smaller at 15 mm. This has seen on prior CT as well. Left Kidney: Imaging of the left kidney. Patient declined additional images as per the sonographer of the left kidney due to pain. Bladder: Underdistended. Diffuse wall thickening. Large prostate. Prostate was not able to be measured by the sonographer at this time. Ureteral jets were seen. Other: None. IMPRESSION: Limited examination as the patient did not want to continue due to pain. Left kidney is not imaged. No right-sided renal collecting system dilatation. Persistent upper pole right-sided renal cysts. Enlarged prostate with mass effect along the base of the bladder. Bladder wall thickening and trabeculation. Electronically Signed   By: Karen Kays M.D.   On: 03/23/2023 16:59   DG Swallowing Func-Speech Pathology Result Date: 03/21/2023 Table formatting from the original result was not included. Modified Barium Swallow Study Patient Details Name: Isaid Cotrone MRN: [id number] Date of Birth: 06/04/1934 Today's Date: 03/21/2023 HPI/PMH: HPI: Pt is an 88yoM who comes [facility] on 03/18/23 after 3 days of decreased  PO intake, pt now lethargic and appears dehydrated. Pt with elevated Ca++ 13.  Pt resides at the [facility] of [address] PMH: CVA w/ Left hemiplegia.   CXR: No active disease.  Of Note: Pt indicated he had been being tx'd for UTI "problems" at the NH in the past month. Clinical Impression: Clinical Impression: Pt presents with moderate oropharyngeal dysphagia that places him at a high risk of aspiration when consuming thin liquids and nectar thick liquids via cup sips.     Pt's oral phase is c/b repetitive tongue movements during bolus propulsion and overall decreased mastication observed.     Pt's pharyngeal phase is c/b severely delayed swallow initiation with boluses resting in the pyriform sinuses for > 5 seconds. When consuming cup sips of thin liquids and nectar thick liquids, boluses spill into airway d/t larger bolus size and inability to contain them within his pyriform sinuses. Aspiration is reduced when consuming thin liquids and nectar thick liquids via spoon.     Given the above, recommend dysphagia 1 diet with thin liquids via spoon, medicine crushed in applesause with strict adherence to supervision and aspiration precautions. Factors that may increase risk of adverse event in presence of aspiration Rubye Oaks & Clearance Coots 2021): Factors that may increase risk of adverse event in presence of aspiration Rubye Oaks & Clearance Coots 2021): Reduced cognitive function; Limited mobility; Dependence for feeding and/or oral hygiene Recommendations/Plan: Swallowing Evaluation Recommendations Swallowing Evaluation Recommendations Recommendations: PO diet PO Diet Recommendation: Dysphagia 1 (Pureed); Thin liquids (Level 0) Liquid Administration via: Spoon Medication Administration: Crushed with puree Supervision: Staff to assist with self-feeding; Full supervision/cueing for swallowing strategies Swallowing strategies  : Minimize environmental distractions; Slow rate; Small bites/sips Postural changes: Position pt  fully upright for  meals; Stay upright 30-60 min after meals Oral care recommendations: Oral care BID (2x/day) Caregiver Recommendations: -- (TBD) Treatment Plan Treatment Plan Treatment recommendations: No treatment recommended at this time Follow-up recommendations: No SLP follow up Functional status assessment: Patient has had a recent decline in their functional status and/or demonstrates limited ability to make significant improvements in function in a reasonable and predictable amount of time. Recommendations Recommendations for follow up therapy are one component of a multi-disciplinary discharge planning process, led by the attending physician.  Recommendations may be updated based on patient status, additional functional criteria and insurance authorization. Assessment: Orofacial Exam: Orofacial Exam Oral Cavity: Oral Hygiene: WFL Oral Cavity - Dentition: Poor condition; Missing dentition Orofacial Anatomy: WFL Oral Motor/Sensory Function: Unable to test (left side facial weakness) Anatomy: Anatomy: WFL; Suspected cervical osteophytes Boluses Administered: Boluses Administered Boluses Administered: Thin liquids (Level 0); Mildly thick liquids (Level 2, nectar thick); Puree  Oral Impairment Domain: Oral Impairment Domain Lip Closure: Escape beyond mid-chin (on left) Tongue control during bolus hold: Not tested Bolus preparation/mastication: Minimal chewing/mashing with majority of bolus unchewed Bolus transport/lingual motion: Repetitive/disorganized tongue motion Oral residue: Trace residue lining oral structures Location of oral residue : Floor of mouth; Tongue; Palate Initiation of pharyngeal swallow : Pyriform sinuses  Pharyngeal Impairment Domain: Pharyngeal Impairment Domain Soft palate elevation: No bolus between soft palate (SP)/pharyngeal wall (PW) Laryngeal elevation: Partial superior movement of thyroid cartilage/partial approximation of arytenoids to epiglottic petiole Anterior hyoid excursion: Partial anterior  movement Epiglottic movement: Complete inversion Laryngeal vestibule closure: Incomplete, narrow column air/contrast in laryngeal vestibule Pharyngeal stripping wave : Present - complete Pharyngeal contraction (A/P view only): Complete Pharyngoesophageal segment opening: Complete distension and complete duration, no obstruction of flow Tongue base retraction: No contrast between tongue base and posterior pharyngeal wall (PPW) Pharyngeal residue: Complete pharyngeal clearance  Esophageal Impairment Domain: Esophageal Impairment Domain Esophageal clearance upright position: Complete clearance, esophageal coating Pill: No data recorded Penetration/Aspiration Scale Score: Penetration/Aspiration Scale Score 1.  Material does not enter airway: Thin liquids (Level 0); Mildly thick liquids (Level 2, nectar thick); Puree 8.  Material enters airway, passes BELOW cords without attempt by patient to eject out (silent aspiration) : Thin liquids (Level 0); Mildly thick liquids (Level 2, nectar thick) (vis cup sips) Compensatory Strategies: Compensatory Strategies Compensatory strategies: No   General Information: Caregiver present: No  Diet Prior to this Study: NPO   Temperature : Normal   Respiratory Status: WFL   Supplemental O2: None (Room air)   History of Recent Intubation: No  Behavior/Cognition: Alert; Distractible; Requires cueing; Doesn't follow directions Self-Feeding Abilities: Needs set-up for self-feeding; Needs assist with self-feeding; Needs hand-over-hand assist for feeding Baseline vocal quality/speech: Normal Volitional Cough: Unable to elicit Volitional Swallow: Unable to elicit Exam Limitations: No limitations Goal Planning: Prognosis for improved oropharyngeal function: Fair Barriers to Reach Goals: Cognitive deficits; Time post onset; Severity of deficits Barriers/Prognosis Comment: old R CVA; missing/poor Dentition; LUE weakness Patient/Family Stated Goal: none stated Consulted and agree with results and  recommendations: Patient; Physician; Nurse Pain: Pain Assessment Pain Assessment: Faces Faces Pain Scale: 4 Pain Location: R shoulder Pain Descriptors / Indicators: Aching; Sore Pain Intervention(s): Monitored during session; Repositioned End of Session: Start Time:SLP Start Time (ACUTE ONLY): 1135 Stop Time: SLP Stop Time (ACUTE ONLY): 1155 Time Calculation:SLP Time Calculation (min) (ACUTE ONLY): 20 min Charges: SLP Evaluations $ SLP Speech Visit: 1 Visit SLP Evaluations $BSS Swallow: 1 Procedure $MBS Swallow: 1 Procedure SLP visit diagnosis:  SLP Visit Diagnosis: Dysphagia, oropharyngeal phase (R13.12) Past Medical History: Past Medical History: Diagnosis Date  Angina of effort (HCC)   BPH (benign prostatic hyperplasia)   Coronary artery disease   Encephalopathy   Hemiparesis (HCC)   Hyperlipidemia   Hypertension   MI (myocardial infarction) (HCC)   Stroke [facility])  Past Surgical History: Past Surgical History: Procedure Laterality Date  CORONARY/GRAFT ACUTE MI REVASCULARIZATION N/A 06/09/2021  Procedure: Coronary/Graft Acute MI Revascularization;  Surgeon: Marcina Millard, MD;  Location: [facility] INVASIVE CV LAB;  Service: Cardiovascular;  Laterality: N/A;  LEFT HEART CATH AND CORONARY ANGIOGRAPHY N/A 06/09/2021  Procedure: LEFT HEART CATH AND CORONARY ANGIOGRAPHY;  Surgeon: Marcina Millard, MD;  Location: [facility] INVASIVE CV LAB;  Service: Cardiovascular;  Laterality: N/A; Happi Overton 03/21/2023, 12:25 PM   Assessment and plan-   #  multiple myeloma, kappa, M protein 5.8 Immunofixation is still pending.  I called patient's niece [name] and updated her about diagnosis.  Patient has multiple comorbidity he is likely will not tolereate aggressive chemotherapy. He may try low intensity treatment outpatient if her mental status improves in the future. Hospice option is also a reasonable choice if family prefers.  [name] states that family does not want patient to get aggressive treatment. She appreciates the  update and patient will be discharged to hospice.     # Hypercalcemia calcium has improved after hydration.    PTH is decreased likely secondary hypercalcemia. Calcium level is increasing. Corrected calcium 12.9 Skeletal survey negative for bone lesions.  Continue hydration, calcitonin.  1/14 S/p Dexamethasone 20mg  x 1,  pamidronate x 1  03/31/2023 Dexamethasone 20mg  Calcium has improved.   # AKI, pending above workup. 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 US renal showed no hydronephrosis.  Avoid nephrotoxins.   Empiric steroid. Kidney function improved.    # Anemia, Multifactorial.  Possibly due to underlying bone marrow involvement of plasma cell disorders. Hb has improved.  B12 is also borderline low.  continue B12 supplementation.  # Thrush on clotrimazole   Thank you for allowing me to participate in the care of this patient.   Rickard Patience, MD, PhD Hematology Oncology 04/03/2023

## 2023-04-03 NOTE — Progress Notes (Addendum)
Progress Note   Patient: Hunter Bailey WUJ:[id number] DOB: [birth year] DOA: 03/18/2023     16 DOS: the patient was seen and examined on 04/03/2023   Brief hospital course:  89 year old gentleman with past medical history significant for HLD, HTN, CAD, BPH, H/o CVA w/ left hemiparesis.  Patient resides in independent living facility.  He was brought in with decreased p.o. liquid intake for the past 3 days. He was lethargic on admission.    In the ER patient's vital stable.  Sodium 130, potassium 3.3, creatinine 2.67 baseline normal, total protein greater than 12, hemoglobin 9.7 [baseline 11.8, done 12/09/2022], platelets 114 [baseline normal]. Calcium elevated to 13. EKG: Atrial fibrillation HR 66, QTc 468.    Patient is admitted for hypercalcemia, started on IV fluids.  Patient's protein is elevated at 12, kidney function worse than baseline.  Oncology evaluated the patient for suspicion of multiple myeloma.  Patient got bone marrow biopsy 03/28/23. His calcium is high started IV hydration, Calcitonin. Pamidronate, steroids per oncology team     Assessment and Plan:  Hypercalcemia  Acute anemia AKI Multiple myeloma confirmed Presented with weakness, lethargy.  Poor oral intake Oncology evaluation is appreciated.  LDH 124, beta-2 microglobulin level ordered.  Elevated kappa light chain with increased light chain ratio. Protein >12 - protein electrophoresis pending. Hb around 8.  Calcium 9.9 following IV hydration, calcitonin therapy, pamidronate and Decadron ordered by oncology team Bone marrow biopsy and aspiration done .  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  PERIPHERAL BLOOD:  -  Normochromic normocytic anemia with evidence of rouleaux.  -  Circulating plasma cells are approximately 5%.  Conventional cytogenetics and plasma cell  myeloma FISH are pending and will be  reported separately  Multiple myeloma has been confirmed but patient is not a candidate for chemotherapy and family does not want aggressive treatment at this time     AKI In the setting of possible multiple myeloma. Baseline serum creatinine of 1.20 from 09/24 and on admission it was 2.67 Renal function has improved but not back to baseline Renal ultrasound showed enlarged prostate. Urine output ~1L. Avoid nephrotoxic drugs.  Monitor daily renal function.     Hypokalemia  Hypernatremia Dehydration Patient noted to have very dry mucous membranes and poor skin turgor Continue free water to improve sodium levels Supplement potassium Continue to monitor daily electrolytes     Thrombocytopenia  Platelets on the lower side of normal but improved Monitor CBC closely and transfuse as indicated     CAD Continue atorvastatin.  Continue metoprolol, Plavix.     Hyperlipidemia History of CVA with left-sided hemiparesis continue Atorvastatin, Plavix.     Severe malnutrition In the context of chronic illness as evidenced by severe fat depletion, severe muscle depletion.  INTERVENTION:    Ensure Enlive po TID, each supplement provides 350 kcal and 20 grams of protein.   MVI po with diet advancement    Pt at high refeed risk; recommend monitor potassium, magnesium and phosphorus labs daily until stable   Daily weights        Failure to thrive Acute metabolic encephalopathy In the setting of hypercalcemia, AKI, possible multiple myeloma More awake and alert.  Confused Patient had a CT scan of the head without contrast which showed hypodensity in the right posterior frontal and anterior parietal lobe, some of which appears chronic, but some of which is age indeterminate and  may be acute to subacute, particularly posteriorly. MRI is recommended for further evaluation. 2. Additional chronic encephalomalacia in the posterior left frontal lobe and right basal ganglia MRI of the  brain without contrast shows no acute intracranial abnormality. Multiple old infarcts and sequelae of chronic small vessel disease. Appreciate speech therapy input, recommends a dysphagia 1 diet with thin liquids and strict aspiration precautions.  Patient requires assistance with meals Appreciate hospice evaluation.  Does not meet criteria for inpatient hospice Awaiting discharge to skilled nursing facility,             Subjective: No new complaints  Physical Exam: Vitals:   04/02/23 1724 04/02/23 2022 04/03/23 0506 04/03/23 0815  BP: 134/70 (!) 147/70 (!) 150/76 (!) 140/80  Pulse: 62 69 72 74  Resp: 16 18 18 16   Temp:  98.9 F (37.2 C)  98 F (36.7 C)  TempSrc:  Oral  Axillary  SpO2: 100% 100% 99% 100%  Weight:      Height:       General - Elderly weak African American male, awake, opens eyes to verbal stimuli, very dry mucous membranes HEENT - PERRLA, EOMI, atraumatic head, non tender sinuses. Lung -bilateral air entry Heart - S1, S2 heard, no murmurs, rubs, 1+ pedal edema. Abdomen - Soft, non tender, bowel sounds good Neuro -generalized weakness Skin - Warm and dry   Data Reviewed: Labs reviewed.  Sodium 144, creatinine 1.67 There are no new results to review at this time.  Family Communication: None  Disposition: Status is: Inpatient Remains inpatient appropriate because: Awaiting discharge to SNF  Planned Discharge Destination: Skilled nursing facility    Time spent: 33 minutes  Author: Lucile Shutters, MD 04/03/2023 12:36 PM  For on call review [email or web address].

## 2023-04-03 NOTE — Progress Notes (Signed)
                                                     Palliative Care Progress Note, Assessment & Plan   Patient Name: Hunter Bailey       Date: 04/03/2023 DOB: May 28, 1934  Age: 89 y.o. MRN#: [id number] Attending Physician: Lucile Shutters, MD Primary Care Physician: [facility] Admit Date: 03/18/2023  Extensive chart review completed including labs, vital signs, imaging, progress notes, orders, and available advanced directive documents from current and previous encounters.   Plan remains for patient to discharge to LTC with hospice services to follow.  TOC following closely for LTC placement.  No acute palliative needs at this time.  PMT will continue to follow and support patient throughout his hospitalization.  Please reengage with PMT when appropriate.  Samara Deist L. Bonita Quin, DNP, FNP-BC Palliative Medicine Team  No charge

## 2023-04-03 NOTE — Progress Notes (Signed)
[facility] Liaison Note   Follow up on new referral for hospice upon d/c from [facility]. Patient has been evaluated several times for Hospice InPatient Unit [facility]) and is not appropriate.   Bed search continues for LTC. Plan will be to receive hospice services at LTC once a bed is found.   Hospital Liaison Team will continue to follow.   Please do not hesitate to call with any hospice related questions or concerns.   Norris Cross, RN Nurse Liaison  [phone]

## 2023-04-03 NOTE — Progress Notes (Signed)
Occupational Therapy Treatment Patient Details Name: Hunter Bailey MRN: [id number] DOB: May 24, 1934 Today's Date: 04/03/2023   History of present illness Hunter Bailey is an 36yoM who comes to [facility] on 03/18/23 after 3 days of decreased PO intake, pt now lethargic and appears dehydrated. Pt with elevated Ca++: 13. Pt resides at the [place] [facility] [address] PMH: CVA c Lt hemiplegia.   OT comments  Hunter Bailey was seen for OT treatment on this date. Upon arrival to room pt in bed, agreeable to tx. Pt requires MAX A sup<>sit, poor sitting tolerance/balance. MAX A self-drinking in sitting. TOTAL A for LB access at bed level. SETUP face washing at bed level. Pt making progress toward goals, will continue to follow POC. Discharge recommendation updated.       If plan is discharge home, recommend the following:  A lot of help with walking and/or transfers;A lot of help with bathing/dressing/bathroom;Direct supervision/assist for financial management;Supervision due to cognitive status;Assistance with cooking/housework;Assist for transportation;Help with stairs or ramp for entrance;Direct supervision/assist for medications management;Two people to help with walking and/or transfers   Equipment Recommendations  Other (comment) (defer)    Recommendations for Other Services      Precautions / Restrictions Precautions Precautions: Fall Restrictions Weight Bearing Restrictions Per Provider Order: No       Mobility Bed Mobility Overal bed mobility: Needs Assistance Bed Mobility: Supine to Sit, Sit to Supine     Supine to sit: Max assist Sit to supine: Max assist        Transfers                   General transfer comment: unsafe to attempt without +2     Balance Overall balance assessment: Needs assistance Sitting-balance support: Feet supported, Single extremity supported Sitting balance-Leahy Scale: Poor   Postural control: Posterior lean                                  ADL either performed or assessed with clinical judgement   ADL Overall ADL's : Needs assistance/impaired                                       General ADL Comments: MAX A self-drinking in sitting. TOTAL A for LB access at bed level. SETUP face washing at bed level    Extremity/Trunk Assessment Upper Extremity Assessment Upper Extremity Assessment: Generalized weakness LUE Deficits / Details: CVA hemi-weakness; fisted grip with no functional use of L hand   Lower Extremity Assessment Lower Extremity Assessment: Generalized weakness         Cognition Arousal: Alert Behavior During Therapy: WFL for tasks assessed/performed Overall Cognitive Status: No family/caregiver present to determine baseline cognitive functioning                                 General Comments: states name, requests applesauce and ice chips                   Pertinent Vitals/ Pain       Pain Assessment Pain Assessment: No/denies pain   Frequency  Min 1X/week        Progress Toward Goals  OT Goals(current goals can now be found in the care plan section)  Progress towards OT goals:  Progressing toward goals  Acute Rehab OT Goals OT Goal Formulation: Patient unable to participate in goal setting Time For Goal Achievement: 04/17/23 Potential to Achieve Goals: Fair ADL Goals Pt Will Perform Lower Body Bathing: with min assist;sit to/from stand;sitting/lateral leans;with adaptive equipment Pt Will Perform Lower Body Dressing: with min assist;with adaptive equipment;sitting/lateral leans;sit to/from stand Pt Will Transfer to Toilet: with contact guard assist;with min assist;ambulating;grab bars;regular height toilet Pt Will Perform Toileting - Clothing Manipulation and hygiene: with supervision;with contact guard assist;sit to/from stand;sitting/lateral leans Additional ADL Goal #1: Pt will demo bed mobility with SUP and good safety to return to PLOF.  Plan       Co-evaluation                 AM-PAC OT "6 Clicks" Daily Activity     Outcome Measure   Help from another person eating meals?: A Lot Help from another person taking care of personal grooming?: A Lot Help from another person toileting, which includes using toliet, bedpan, or urinal?: Total Help from another person bathing (including washing, rinsing, drying)?: A Lot Help from another person to put on and taking off regular upper body clothing?: A Lot Help from another person to put on and taking off regular lower body clothing?: Total 6 Click Score: 10    End of Session    OT Visit Diagnosis: Other abnormalities of gait and mobility (R26.89);Unsteadiness on feet (R26.81);Muscle weakness (generalized) (M62.81);Hemiplegia and hemiparesis Hemiplegia - Right/Left: Left Hemiplegia - dominant/non-dominant: Non-Dominant Hemiplegia - caused by: Cerebral infarction   Activity Tolerance Patient tolerated treatment well   Patient Left in bed;with call bell/phone within reach   Nurse Communication Mobility status        Time: 5956-3875 OT Time Calculation (min): 11 min  Charges: OT General Charges $OT Visit: 1 Visit OT Treatments $Self Care/Home Management : 8-22 mins  Kathie Dike, M.S. OTR/L  04/03/23, 4:23 PM  ascom [phone]

## 2023-04-03 NOTE — TOC Progression Note (Addendum)
Transition of Care [facility]) - Progression Note    Patient Details  Name: Camdan Floresca MRN: [id number] Date of Birth: 1934/12/10  [facility]) CM/SW Contact  Garret Reddish, RN Phone Number: 04/03/2023, 9:40 AM  Clinical Narrative:    Chart reviewed.  I have asked Tanya with Shay with [facility] to review patient's referral for LTC with Hospice following.  I have left a voice message for [facility] to review patient's referral for LTC and Hospice.  I have also spoken with Amalia Hailey with [facility] [place].  Amalia Hailey reports that patient was a one assist prior to admission.  Amalia Hailey reports he would need to assist patient's diet, and mobility in order to consider accepting patient back with Hospice.  Noted that patient has not been able to work with PT in the last few days and is max assist at this time.  Bed search extended to entire hub.    Patient will more then likely need LTC with Hospice following.  TOC will continue to search for LTC bed.               Expected Discharge Plan and Services                                               Social Determinants of Health (SDOH) Interventions SDOH Screenings   Food Insecurity: Patient Unable To Answer (03/22/2023)  Housing: Patient Unable To Answer (03/22/2023)  Transportation Needs: Patient Unable To Answer (03/22/2023)  Utilities: Patient Unable To Answer (03/22/2023)  Social Connections: Unknown (03/22/2023)  Tobacco Use: Medium Risk (03/23/2023)    Readmission Risk Interventions     No data to display

## 2023-04-03 NOTE — Plan of Care (Signed)
  Problem: Education: Goal: Knowledge of General Education information will improve Description: Including pain rating scale, medication(s)/side effects and non-pharmacologic comfort measures Outcome: Progressing   Problem: Clinical Measurements: Goal: Ability to maintain clinical measurements within normal limits will improve Outcome: Progressing   

## 2023-04-04 DIAGNOSIS — Z515 Encounter for palliative care: Secondary | ICD-10-CM | POA: Diagnosis not present

## 2023-04-04 DIAGNOSIS — C9000 Multiple myeloma not having achieved remission: Secondary | ICD-10-CM | POA: Diagnosis not present

## 2023-04-04 LAB — GLUCOSE, CAPILLARY
Glucose-Capillary: 94 mg/dL (ref 70–99)
Glucose-Capillary: 94 mg/dL (ref 70–99)

## 2023-04-04 MED ORDER — ORAL CARE MOUTH RINSE
15.0000 mL | OROMUCOSAL | Status: DC | PRN
Start: 2023-04-04 — End: 2023-04-04

## 2023-04-04 MED ORDER — ADULT MULTIVITAMIN W/MINERALS CH
1.0000 | ORAL_TABLET | Freq: Every day | ORAL | Status: DC
  Administered 2023-04-05 – 2023-04-06 (×2): 1 via ORAL
  Filled 2023-04-04 (×2): qty 1

## 2023-04-04 MED ORDER — ORAL CARE MOUTH RINSE
15.0000 mL | OROMUCOSAL | Status: DC
  Administered 2023-04-04 – 2023-04-09 (×19): 15 mL via OROMUCOSAL

## 2023-04-04 NOTE — Progress Notes (Signed)
[facility] Liaison Note   Follow up on new referral for hospice upon d/c from [facility].    Bed search continues for LTC.   Plan will be to receive hospice services at LTC once a bed is found.   Hospital Liaison Team will continue to follow.   Please do not hesitate to call with any hospice related questions or concerns.   [facility] Liaison [phone]

## 2023-04-04 NOTE — Plan of Care (Signed)
  Problem: Nutrition: Goal: Adequate nutrition will be maintained Outcome: Progressing   Problem: Safety: Goal: Ability to remain free from injury will improve Outcome: Progressing   Problem: Skin Integrity: Goal: Risk for impaired skin integrity will decrease Outcome: Progressing   

## 2023-04-04 NOTE — Progress Notes (Signed)
Nutrition Follow-up  DOCUMENTATION CODES:   Severe malnutrition in context of chronic illness  INTERVENTION:   -Continue Ensure Enlive po TID, each supplement provides 350 kcal and 20 grams of protein -MVI with minerals daily -Feeding assistance with meals  NUTRITION DIAGNOSIS:   Severe Malnutrition related to chronic illness as evidenced by severe fat depletion, severe muscle depletion.  Ongoing  GOAL:   Patient will meet greater than or equal to 90% of their needs  Progressing   MONITOR:   Diet advancement, Labs, Weight trends, I & O's, Skin  REASON FOR ASSESSMENT:   Consult Assessment of nutrition requirement/status  ASSESSMENT:   89 y/o male with h/o BPH, CAD, CVA with L hemiparesis, DM, CKD III, PVD, GERD, HTN, MI and HLD who is admitted with AKI, hypercalcemia, AMS, anemia and high suspicion for multiple myeloma.  1/14- s/p bone marrow biopsy 1/16- PT signed off due to pt unable to participate, MRI negative for acute intracranical findings 1/17- s/p BSE- advanced to dysphagia 1 diet with thin liquids  Reviewed I/O's: +240 ml x 24 hours and -4.3 L since 03/21/23  Per oncology note on 03/30/23, pt would not tolerate aggressive chemotherapy.   Pt with minimal oral intake and requires feeding assistance. Noted meal completion 0-50%.   Per palliative care notes, pt family desires hospice services. Pt does not meet criteria for inpatient hospice services at this time.  Per TOC notes, plan for SNF with hospice services at discharge.   Medications reviewed and include vitamin B-12.   Wt has been stable over the past week.   Labs reviewed: CBGS: 94-107 (inpatient orders for glycemic control are none).    Diet Order:   Diet Order             DIET - DYS 1 Fluid consistency: Thin  Diet effective now                   EDUCATION NEEDS:   Not appropriate for education at this time  Skin:  Skin Assessment: Reviewed RN Assessment  Last BM:  04/03/23 (type  6)  Height:   Ht Readings from Last 1 Encounters:  03/18/23 5\' 8"  (1.727 m)    Weight:   Wt Readings from Last 1 Encounters:  04/04/23 57 kg    Ideal Body Weight:  70 kg  BMI:  Body mass index is 19.11 kg/m.  Estimated Nutritional Needs:   Kcal:  1700-2000kcal/day  Protein:  85-100g/day  Fluid:  1.5-1.7L/day    Levada Schilling, RD, LDN, CDCES Registered Dietitian III Certified Diabetes Care and Education Specialist If unable to reach this RD, please use "RD Inpatient" group chat on secure chat between hours of 8am-4 pm daily

## 2023-04-04 NOTE — Plan of Care (Signed)

## 2023-04-04 NOTE — TOC Progression Note (Addendum)
Transition of Care [facility]) - Progression Note    Patient Details  Name: Hunter Bailey MRN: [id number] Date of Birth: 1934-11-26  Transition of [facility]) CM/SW Contact  Chapman Fitch, RN Phone Number: 04/04/2023, 9:11 AM  Clinical Narrative:        Bed Search extended  Message sent to [facility] with [facility], [facility], and [place] with [facility] to clarify if they are able to offer a LTC bed with hospice    Update:   [facility] - unable to offer - [facility] - under review - [facility] - unable to offer - Vm left for Brianna at [facility] - Spoke with [place] she is to review and call TOC back  - Linda[facility] Pascal - VM left for Admissions coordinator  - Hannah Beat - VM left for Caryl Asp in Admissions    Expected Discharge Plan and Services                                               Social Determinants of Health (SDOH) Interventions SDOH Screenings   Food Insecurity: Patient Unable To Answer (03/22/2023)  Housing: Patient Unable To Answer (03/22/2023)  Transportation Needs: Patient Unable To Answer (03/22/2023)  Utilities: Patient Unable To Answer (03/22/2023)  Social Connections: Unknown (03/22/2023)  Tobacco Use: Medium Risk (03/23/2023)    Readmission Risk Interventions     No data to display

## 2023-04-04 NOTE — Progress Notes (Signed)
Progress Note   Patient: Hunter Bailey ZOX:[id number] DOB: 1935-01-29 DOA: 03/18/2023     17 DOS: the patient was seen and examined on 04/04/2023   Brief hospital course:  89 year old gentleman with past medical history significant for HLD, HTN, CAD, BPH, H/o CVA w/ left hemiparesis.  Patient resides in independent living facility.  He was brought in with decreased p.o. liquid intake for the past 3 days. He was lethargic on admission.    In the ER patient's vital stable.  Sodium 130, potassium 3.3, creatinine 2.67 baseline normal, total protein greater than 12, hemoglobin 9.7 [baseline 11.8, done 12/09/2022], platelets 114 [baseline normal]. Calcium elevated to 13. EKG: Atrial fibrillation HR 66, QTc 468.    Patient is admitted for hypercalcemia, started on IV fluids.  Patient's protein is elevated at 12, kidney function worse than baseline.  Oncology evaluated the patient for suspicion of multiple myeloma.  Patient got bone marrow biopsy 03/28/23. His calcium is high started IV hydration, Calcitonin. Pamidronate, steroids per oncology team       Assessment and Plan:  Hypercalcemia  Acute anemia AKI Multiple myeloma confirmed Presented with weakness, lethargy.  Poor oral intake Oncology evaluation is appreciated.  LDH 124, beta-2 microglobulin level ordered.  Elevated kappa light chain with increased light chain ratio. Protein >12 - protein electrophoresis pending. Hb around 8.  Calcium 9.9 following IV hydration, calcitonin therapy, pamidronate and Decadron ordered by oncology team Bone marrow biopsy and aspiration done . 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  PERIPHERAL BLOOD:  -  Normochromic normocytic anemia with evidence of rouleaux.  -  Circulating plasma cells are approximately 5%.  Conventional cytogenetics and plasma cell  myeloma FISH are pending and will be  reported separately  Multiple myeloma has been confirmed but patient is not a candidate for chemotherapy and family does not want aggressive treatment at this time     AKI In the setting of possible multiple myeloma. Baseline serum creatinine of 1.20 from 09/24 and on admission it was 2.67 Renal function has improved but not back to baseline Renal ultrasound showed enlarged prostate. Urine output ~1L. Avoid nephrotoxic drugs.  Monitor daily renal function.     Hypokalemia  Hypernatremia Dehydration Patient noted to have very dry mucous membranes and poor skin turgor Continue free water to improve sodium levels Supplement potassium Continue to monitor daily electrolytes     Thrombocytopenia  Platelets on the lower side of normal but improved Monitor CBC closely and transfuse as indicated     CAD Continue atorvastatin.  Continue metoprolol, Plavix.     Hyperlipidemia History of CVA with left-sided hemiparesis continue Atorvastatin, Plavix.     Severe malnutrition In the context of chronic illness as evidenced by severe fat depletion, severe muscle depletion.  INTERVENTION:    Ensure Enlive po TID, each supplement provides 350 kcal and 20 grams of protein.   MVI po with diet advancement    Pt at high refeed risk; recommend monitor potassium, magnesium and phosphorus labs daily until stable   Daily weights        Failure to thrive Acute metabolic encephalopathy In the setting of hypercalcemia, AKI, possible multiple myeloma More awake and alert.  Confused Patient had a CT scan of the head without contrast which showed hypodensity in the right posterior frontal and anterior parietal lobe, some of which appears chronic, but some of which is age indeterminate  and may be acute to subacute, particularly posteriorly. MRI is recommended for further evaluation. 2. Additional chronic encephalomalacia in the posterior left frontal lobe and right basal ganglia MRI of the  brain without contrast shows no acute intracranial abnormality. Multiple old infarcts and sequelae of chronic small vessel disease. Appreciate speech therapy input, recommends a dysphagia 1 diet with thin liquids and strict aspiration precautions.  Patient requires assistance with meals Appreciate hospice evaluation.  Does not meet criteria for inpatient hospice Awaiting discharge to skilled nursing facility,       Subjective: Uneventful night  Physical Exam: Vitals:   04/03/23 1953 04/04/23 0356 04/04/23 0500 04/04/23 0749  BP: (!) 161/86 (!) 155/80  (!) 153/59  Pulse: 80 64  68  Resp: 20 20  18   Temp: 97.9 F (36.6 C) (!) 97.5 F (36.4 C)  98 F (36.7 C)  TempSrc: Oral Oral    SpO2: 96% (!) 89%  100%  Weight:   57 kg   Height:       General - Elderly weak African American male, awake, opens eyes to verbal stimuli, very dry mucous membranes HEENT - PERRLA, EOMI, atraumatic head, non tender sinuses. Lung -bilateral air entry Heart - S1, S2 heard, no murmurs, rubs, 1+ pedal edema. Abdomen - Soft, non tender, bowel sounds good Neuro -generalized weakness Skin - Warm and dry   Data Reviewed:  There are no new results to review at this time.  Family Communication: None  Disposition: Status is: Inpatient Remains inpatient appropriate because: Awaiting discharge to SNF  Planned Discharge Destination: Skilled nursing facility    Time spent: 30 minutes  Author: Lucile Shutters, MD 04/04/2023 11:43 AM  For on call review [email or web address].

## 2023-04-04 NOTE — Progress Notes (Signed)
                                                     Palliative Care Progress Note, Assessment & Plan   Patient Name: Hunter Bailey       Date: 04/04/2023 DOB: 07-Nov-1934  Age: 89 y.o. MRN#: [id number] Attending Physician: Lucile Shutters, MD Primary Care Physician: [facility] Admit Date: 03/18/2023  Subjective: Lying in bed in no apparent distress.  He is sleeping, awakens to my presence, but quickly returns to sleep.  He readjust himself in the bed and returns to rest with respirations even and unlabored.  No family or friends present during my visit.  HPI: 89 y.o. male  with past medical history of CVA with left hemiparesis, BPH, CAD, HTN, HLD admitted on 03/18/2023 with decreased p.o. intake X 3 days and lethargy.   Patient is a resident of [facility] [address] [facility].  Upon presentation to ED, calcium elevated to 13.     Oncology was consulted for suspicion of multiple myeloma.  03/28/2023, patient underwent bone marrow biopsy, which confirmed diagnosis of multiple myeloma involving 80%.   Patient admitted for hypercalcemia and treated with IV hydration, calcitonin, pamidronate, and steroids as per oncology team.   Remains encephalopathic, MRI 1/16 negative for acute intracranial findings   Palliative Medicine consulted for assisting with goals of care conversations.  Summary of counseling/coordination of care: Extensive chart review completed prior to meeting patient including labs, vital signs, imaging, progress notes, orders, and available advanced directive documents from current and previous encounters.   After reviewing the patient's chart, I assessed the patient at bedside.  No signs of distress, pain, discomfort, anxiety, or complaint at this time.  No adjustment to [facility] needed.  Plan remains for patient to  discharge to LTC with hospice services to follow.  TOC following closely for discharge planning.  Hospice liaison also engaged to assist with discharge planning.  PMT remains billed to patient and family throughout his hospitalization.  PMT will continue to monitor patient but please engage with PMT when appropriate.  Physical Exam Vitals reviewed.  Constitutional:      General: He is not in acute distress.    Appearance: He is normal weight. He is not ill-appearing.  HENT:     Head: Normocephalic.     Mouth/Throat:     Mouth: Mucous membranes are moist.  Cardiovascular:     Rate and Rhythm: Normal rate.  Abdominal:     Palpations: Abdomen is soft.  Musculoskeletal:     Comments: Generalized weakness  Skin:    General: Skin is warm and dry.  Psychiatric:        Mood and Affect: Mood normal.        Behavior: Behavior normal.             Total Time 25 minutes   Time spent includes: Detailed review of medical records (labs, imaging, vital signs), medically appropriate exam (mental status, respiratory, cardiac, skin), discussed with treatment team, counseling and educating patient, family and staff, documenting clinical information, medication management and coordination of care.  Samara Deist L. Bonita Quin, DNP, FNP-BC Palliative Medicine Team

## 2023-04-04 NOTE — Progress Notes (Signed)
PT Cancellation Note  Patient Details Name: Hunter Bailey MRN: [id number] DOB: 05-02-1934   Cancelled Treatment:    Reason Eval/Treat Not Completed: PT screened, no needs identified, will sign off (Chart reviewed. No Acute PT needs at this time. Pt is from LTC, minimally ambulatory. Plan is for return to ALF with hospice services. Will sign off at this time. Mobility needs can be achieved with nursing while admitted.)   Govind Furey C 04/04/2023, 8:36 AM

## 2023-04-05 DIAGNOSIS — Z515 Encounter for palliative care: Secondary | ICD-10-CM | POA: Diagnosis not present

## 2023-04-05 DIAGNOSIS — C9000 Multiple myeloma not having achieved remission: Secondary | ICD-10-CM | POA: Diagnosis not present

## 2023-04-05 LAB — GLUCOSE, CAPILLARY
Glucose-Capillary: 115 mg/dL — ABNORMAL HIGH (ref 70–99)
Glucose-Capillary: 123 mg/dL — ABNORMAL HIGH (ref 70–99)
Glucose-Capillary: 83 mg/dL (ref 70–99)
Glucose-Capillary: 92 mg/dL (ref 70–99)

## 2023-04-05 MED ORDER — ATORVASTATIN CALCIUM 20 MG PO TABS
80.0000 mg | ORAL_TABLET | Freq: Every day | ORAL | Status: DC
  Administered 2023-04-06: 80 mg via ORAL
  Filled 2023-04-05: qty 4

## 2023-04-05 MED ORDER — NYSTATIN 100000 UNIT/ML MT SUSP
5.0000 mL | Freq: Four times a day (QID) | OROMUCOSAL | Status: DC
  Administered 2023-04-05 – 2023-04-08 (×10): 500000 [IU] via ORAL
  Filled 2023-04-05 (×11): qty 5

## 2023-04-05 MED ORDER — VITAMIN D 25 MCG (1000 UNIT) PO TABS
5000.0000 [IU] | ORAL_TABLET | Freq: Every day | ORAL | Status: DC
Start: 2023-04-05 — End: 2023-04-06
  Administered 2023-04-05 – 2023-04-06 (×2): 5000 [IU] via ORAL
  Filled 2023-04-05 (×2): qty 5

## 2023-04-05 MED ORDER — HYDROCHLOROTHIAZIDE 12.5 MG PO TABS
12.5000 mg | ORAL_TABLET | Freq: Every day | ORAL | Status: DC
  Administered 2023-04-05 – 2023-04-07 (×3): 12.5 mg via ORAL
  Filled 2023-04-05 (×4): qty 1

## 2023-04-05 MED ORDER — DOCUSATE SODIUM 100 MG PO CAPS
200.0000 mg | ORAL_CAPSULE | Freq: Every day | ORAL | Status: DC
  Administered 2023-04-05 – 2023-04-08 (×3): 200 mg via ORAL
  Filled 2023-04-05 (×4): qty 2

## 2023-04-05 NOTE — Progress Notes (Signed)
PROGRESS NOTE    Hunter Bailey  YNW:[id number] DOB: 1935-02-22 DOA: 03/18/2023 PCP: [facility]    Brief Narrative:  89 year old gentleman with past medical history significant for HLD, HTN, CAD, BPH, H/o CVA w/ left hemiparesis.  Patient resides in independent living facility.  He was brought in with decreased p.o. liquid intake for the past 3 days. He was lethargic on admission.    In the ER patient's vital stable.  Sodium 130, potassium 3.3, creatinine 2.67 baseline normal, total protein greater than 12, hemoglobin 9.7 [baseline 11.8, done 12/09/2022], platelets 114 [baseline normal]. Calcium elevated to 13. EKG: Atrial fibrillation HR 66, QTc 468.    Patient is admitted for hypercalcemia, started on IV fluids.  Patient's protein is elevated at 12, kidney function worse than baseline.  Oncology evaluated the patient for suspicion of multiple myeloma.  Patient got bone marrow biopsy 03/28/23. His calcium is high started IV hydration, Calcitonin. Pamidronate, steroids per oncology team    Assessment & Plan:   Principal Problem:   Hypercalcemia Active Problems:   BPH (benign prostatic hyperplasia)   Coronary disease   Left hemiparesis (HCC)   Other vitamin B12 deficiency anemias   Type 2 diabetes mellitus without complications (HCC)   AKI (acute kidney injury) (HCC)   Hypokalemia   Hyponatremia   Anemia associated with chemotherapy   Protein-calorie malnutrition, severe   Multiple myeloma not having achieved remission (HCC)   Dehydration   Oral candida  Hypercalcemia  Acute anemia AKI Multiple myeloma confirmed Presented with weakness, lethargy.  Poor oral intake Oncology evaluation is appreciated.  LDH 124, beta-2 microglobulin level ordered.  Elevated kappa light chain with increased light chain ratio. Protein >12 - protein electrophoresis pending. Hb around 8.  Calcium 9.9 following IV hydration, calcitonin therapy, pamidronate and Decadron ordered by oncology  team Bone marrow biopsy and aspiration done . 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  PERIPHERAL BLOOD:  -  Normochromic normocytic anemia with evidence of rouleaux.  -  Circulating plasma cells are approximately 5%.  Conventional cytogenetics and plasma cell  myeloma FISH are pending and will be reported separately  Multiple myeloma has been confirmed but patient is not a candidate for chemotherapy and family does not want aggressive treatment at this time     AKI In the setting of possible multiple myeloma. Baseline serum creatinine of 1.20 from 09/24 and on admission it was 2.67 Renal function has improved but not back to baseline Renal ultrasound showed enlarged prostate. Avoid nephrotoxic drugs.  Monitor daily renal function.     Hypokalemia  Hypernatremia Dehydration Patient noted to have very dry mucous membranes and poor skin turgor Slowly improved Supplement potassium Continue to monitor daily electrolytes     Thrombocytopenia  Platelets on the lower side of normal but improved Monitor CBC closely and transfuse as indicated     CAD Continue atorvastatin.  Continue metoprolol, Plavix.     Hyperlipidemia History of CVA with left-sided hemiparesis continue Atorvastatin, Plavix.  Severe malnutrition In the context of chronic illness as evidenced by severe fat depletion, severe muscle depletion. RD following, recs appreciated   DVT prophylaxis: SQ heparin Code Status: DNR Family Communication: None today Disposition Plan: Status is: Inpatient Remains inpatient appropriate because: Need skilled nursing facility   Level of care: Telemetry Medical  Consultants:  None  Procedures:  None  Antimicrobials: None    Subjective: Seen  and examined.  Resting in bed.  Appears frail but in no visible distress.  Objective: Vitals:   04/04/23  2225 04/05/23 0156 04/05/23 0205 04/05/23 0727  BP: 139/80 (!) 149/89  (!) 155/76  Pulse: 76 (!) 30 82 60  Resp:  17 16 18   Temp:  98.2 F (36.8 C)  99.7 F (37.6 C)  TempSrc:    Oral  SpO2:  96%  100%  Weight:      Height:        Intake/Output Summary (Last 24 hours) at 04/05/2023 1136 Last data filed at 04/05/2023 0900 Gross per 24 hour  Intake 600 ml  Output 600 ml  Net 0 ml   Filed Weights   03/31/23 0443 04/01/23 0749 04/04/23 0500  Weight: 42 kg 42 kg 57 kg    Examination:  General exam: Appears frail and chronically ill Respiratory system: Bibasilar crackles.  Normal work of breathing.  Room air Cardiovascular system: S1-S2, RRR, no murmurs, no pedal edema Gastrointestinal system: Thin, soft, NT/ND, normal bowel sounds Central nervous system: Alert but lethargic.  Unable to assess orientation.  No focal deficits Extremities: Symmetrically decreased power Skin: No rashes, lesions or ulcers Psychiatry: Judgement and insight appear normal. Mood & affect flattened.     Data Reviewed: I have personally reviewed following labs and imaging studies  CBC: Recent Labs  Lab 03/30/23 0440 03/31/23 0616 04/01/23 0423  WBC 6.6 8.1 8.8  HGB 8.3* 8.7* 8.4*  HCT 26.4* 27.9* 26.9*  MCV 99.6 97.6 100.7*  PLT 103* 97* 95*   Basic Metabolic Panel: Recent Labs  Lab 03/30/23 0440 03/31/23 0616 04/01/23 0423 04/02/23 0547 04/03/23 0522  NA 149* 153* 149* 149* 144  K 3.2* 3.2* 3.9 3.4* 3.6  CL 125* 129* 127* 126* 123*  CO2 20* 22 19* 20* 20*  GLUCOSE 123* 119* 184* 121* 116*  BUN 73* 72* 74* 76* 71*  CREATININE 2.22* 1.93* 2.08* 1.81* 1.67*  CALCIUM 9.9 9.8 9.5 9.6 9.3  MG  --  2.4  --   --   --    GFR: Estimated Creatinine Clearance: 25.6 mL/min (A) (by C-G formula based on SCr of 1.67 mg/dL (H)). Liver Function Tests: Recent Labs  Lab 03/30/23 0440 03/31/23 0616 04/01/23 0423  AST 16 16 16   ALT 12 12 13   ALKPHOS 31* 30* 29*  BILITOT 0.8 0.6 0.6  PROT  11.7* 11.7* 11.6*  ALBUMIN 2.2* 2.2* 2.0*   No results for input(s): "LIPASE", "AMYLASE" in the last 168 hours. No results for input(s): "AMMONIA" in the last 168 hours. Coagulation Profile: No results for input(s): "INR", "PROTIME" in the last 168 hours. Cardiac Enzymes: No results for input(s): "CKTOTAL", "CKMB", "CKMBINDEX", "TROPONINI" in the last 168 hours. BNP (last 3 results) No results for input(s): "PROBNP" in the last 8760 hours. HbA1C: No results for input(s): "HGBA1C" in the last 72 hours. CBG: Recent Labs  Lab 04/04/23 0110 04/04/23 0548 04/05/23 0001 04/05/23 0555 04/05/23 0821  GLUCAP 94 94 115* 123* 92   Lipid Profile: No results for input(s): "CHOL", "HDL", "LDLCALC", "TRIG", "CHOLHDL", "LDLDIRECT" in the last 72 hours. Thyroid Function Tests: No results for input(s): "TSH", "T4TOTAL", "FREET4", "T3FREE", "THYROIDAB" in the last 72 hours. Anemia Panel: No results for input(s): "VITAMINB12", "FOLATE", "FERRITIN", "TIBC", "IRON", "RETICCTPCT" in the last 72 hours. Sepsis Labs: No results for input(s): "PROCALCITON", "LATICACIDVEN" in the last 168 hours.  No results found for this or any previous visit (from the past 240 hours).  Radiology Studies: No results found.      Scheduled Meds:  atorvastatin  20 mg Oral Daily   clopidogrel  75 mg Oral Daily   clotrimazole  10 mg Oral 5 X Daily   vitamin B-12  1,000 mcg Oral Daily   feeding supplement  237 mL Oral TID BM   heparin  5,000 Units Subcutaneous Q8H   latanoprost  1 drop Both Eyes QHS   multivitamin with minerals  1 tablet Oral Daily   mouth rinse  15 mL Mouth Rinse 4 times per day   timolol  1 drop Both Eyes BID   Continuous Infusions:   LOS: 18 days    Tresa Moore, MD [facility]   If 7PM-7AM, please contact night-coverage  04/05/2023, 11:36 AM

## 2023-04-05 NOTE — TOC Progression Note (Addendum)
Transition of Care [facility]) - Progression Note    Patient Details  Name: Hunter Bailey MRN: [id number] Date of Birth: 1934/03/20  [facility] CM/SW Contact  Tyshea Imel [place], [place] Phone Number: 04/05/2023, 10:54 AM  Clinical Narrative:     LTC SNF search Update:   [facility] - unable to offer - [facility] - unable to offer for LTC - [facility] - unable [facility] offer -  [facility] unable to offer for LTC -  [facility] - awaiting response - [facility] - unable to offer for LTC  - [facility] - unable to offer for LTC -[facility]- "Considering"               Social Determinants of Health (SDOH) Interventions SDOH Screenings   Food Insecurity: Patient Unable To Answer (03/22/2023)  Housing: Patient Unable To Answer (03/22/2023)  Transportation Needs: Patient Unable To Answer (03/22/2023)  Utilities: Patient Unable To Answer (03/22/2023)  Social Connections: Unknown (03/22/2023)  Tobacco Use: Medium Risk (03/23/2023)    Readmission Risk Interventions     No data to display

## 2023-04-05 NOTE — Progress Notes (Signed)
[facility] Liaison Note   Follow up on new referral for hospice upon d/c from [facility].    Bed search continues for LTC.   Plan will be to receive hospice services at LTC once a bed is found.   Hospital Liaison Team will continue to follow.   Please do not hesitate to call with any hospice related questions or concerns.   [facility] Liaison [phone]

## 2023-04-05 NOTE — Plan of Care (Signed)
  Problem: Clinical Measurements: Goal: Ability to maintain clinical measurements within normal limits will improve Outcome: Progressing Goal: Will remain free from infection Outcome: Progressing Goal: Diagnostic test results will improve Outcome: Progressing Goal: Respiratory complications will improve Outcome: Progressing Goal: Cardiovascular complication will be avoided Outcome: Progressing   Problem: Nutrition: Goal: Adequate nutrition will be maintained Outcome: Progressing   Problem: Elimination: Goal: Will not experience complications related to bowel motility Outcome: Progressing Goal: Will not experience complications related to urinary retention Outcome: Progressing   Problem: Pain Management: Goal: General experience of comfort will improve Outcome: Progressing   Problem: Safety: Goal: Ability to remain free from injury will improve Outcome: Progressing   Problem: Skin Integrity: Goal: Risk for impaired skin integrity will decrease Outcome: Progressing

## 2023-04-05 NOTE — Progress Notes (Signed)
Palliative Care Progress Note, Assessment & Plan   Patient Name: Hunter Bailey       Date: 04/05/2023 DOB: [birth year]  Age: 89 y.o. MRN#: [id number] Attending Physician: Tresa Moore, MD Primary Care Physician: [facility] Admit Date: 03/18/2023  Subjective: Patient is lying in bed, resting, but easily awakens to my presence.  He makes eye contact with me but is unable to engage in conversation appropriately.  No family or friends present during my visit.  HPI: 89 y.o. male  with past medical history of CVA with left hemiparesis, BPH, CAD, HTN, HLD admitted on 03/18/2023 with decreased p.o. intake X 3 days and lethargy.   Patient is a resident [facility] [address] [facility].  Upon presentation to ED, calcium elevated to 13.     Oncology was consulted for suspicion of multiple myeloma.  03/28/2023, patient underwent bone marrow biopsy, which confirmed diagnosis of multiple myeloma involving 80%.   Patient admitted for hypercalcemia and treated with IV hydration, calcitonin, pamidronate, and steroids as per oncology team.   Remains encephalopathic, MRI 1/16 negative for acute intracranial findings   Palliative Medicine consulted for assisting with goals of care conversations.  Summary of counseling/coordination of care: Extensive chart review completed prior to meeting patient including labs, vital signs, imaging, progress notes, orders, and available advanced directive documents from current and previous encounters.   After reviewing the patient's chart and assessing the patient at bedside, I spoke with patient in regards to symptom management and goals of care.   Spoke with patient in regards to symptoms.  He is unable to participate in emptying assessment.  His  response to every question I asked was either "Willow" or "no".  Nonverbal signs or cues of distress or pain assessed.  Patient does not have brow furrowing, grimacing, moaning/groaning, fidgeting, or other signs of pain or discomfort at this time.  No adjustment to [facility] needed.  Plan remains for patient to discharge to long-term care facility with hospice services to follow.  TOC following closely and awaiting responses for bed offers.  PMT will continue to follow and support patient throughout his hospitalization.  Physical Exam Vitals reviewed.  Constitutional:      General: He is not in acute distress.    Appearance: He is ill-appearing.  HENT:     Head: Normocephalic.     Mouth/Throat:     Mouth: Mucous membranes are dry.  Eyes:     Pupils: Pupils are equal, round, and reactive to light.  Pulmonary:     Effort: Pulmonary effort is normal.  Abdominal:     Palpations: Abdomen is soft.  Musculoskeletal:     Comments: Generalized weakness  Skin:    General: Skin is warm and dry.  Neurological:     Mental Status: He is alert.  Psychiatric:        Behavior: Behavior normal.        Judgment: Judgment normal.             Total Time 25 minutes   Time spent includes: Detailed review of medical records (labs, imaging, vital signs), medically appropriate exam (mental status, respiratory, cardiac, skin), discussed with treatment team,  counseling and educating patient, family and staff, documenting clinical information, medication management and coordination of care.  Hunter Deist L. Bonita Quin, DNP, FNP-BC Palliative Medicine Team

## 2023-04-06 DIAGNOSIS — E860 Dehydration: Secondary | ICD-10-CM | POA: Diagnosis not present

## 2023-04-06 DIAGNOSIS — B370 Candidal stomatitis: Secondary | ICD-10-CM | POA: Diagnosis not present

## 2023-04-06 DIAGNOSIS — C9000 Multiple myeloma not having achieved remission: Secondary | ICD-10-CM | POA: Diagnosis not present

## 2023-04-06 LAB — GLUCOSE, CAPILLARY
Glucose-Capillary: 126 mg/dL — ABNORMAL HIGH (ref 70–99)
Glucose-Capillary: 89 mg/dL (ref 70–99)

## 2023-04-06 MED ORDER — POLYVINYL ALCOHOL 1.4 % OP SOLN: 1.0000 [drp] | Freq: Four times a day (QID) | OPHTHALMIC | Status: DC | PRN

## 2023-04-06 MED ORDER — GLYCOPYRROLATE 0.2 MG/ML IJ SOLN: 0.2000 mg | INTRAMUSCULAR | Status: DC | PRN

## 2023-04-06 MED ORDER — HALOPERIDOL LACTATE 2 MG/ML PO CONC: 0.5000 mg | ORAL | Status: DC | PRN

## 2023-04-06 MED ORDER — HALOPERIDOL 0.5 MG PO TABS: 0.5000 mg | ORAL_TABLET | ORAL | Status: DC | PRN

## 2023-04-06 MED ORDER — BIOTENE DRY MOUTH MT LIQD: 15.0000 mL | OROMUCOSAL | Status: DC | PRN

## 2023-04-06 MED ORDER — HALOPERIDOL LACTATE 5 MG/ML IJ SOLN: 0.5000 mg | INTRAMUSCULAR | Status: DC | PRN

## 2023-04-06 MED ORDER — GLYCOPYRROLATE 1 MG PO TABS: 1.0000 mg | ORAL_TABLET | ORAL | Status: DC | PRN

## 2023-04-06 MED ORDER — DEXAMETHASONE SODIUM PHOSPHATE 4 MG/ML IJ SOLN
20.0000 mg | Freq: Once | INTRAMUSCULAR | Status: AC
  Administered 2023-04-06: 20 mg via INTRAVENOUS
  Filled 2023-04-06: qty 5

## 2023-04-06 NOTE — Progress Notes (Signed)
[facility] Liaison Note   Follow up on new referral for hospice upon d/c from [facility].    Bed search continues for LTC.   Plan will be to receive hospice services at LTC once a bed is found.   Hospital Liaison Team will continue to follow.   Please do not hesitate to call with any hospice related questions or concerns.   [facility] Liaison [phone]

## 2023-04-06 NOTE — Plan of Care (Signed)

## 2023-04-06 NOTE — [place] FL2 (Signed)
[place] MEDICAID FL2 LEVEL OF CARE FORM     IDENTIFICATION  Patient Name: Hunter Bailey Birthdate: 24-Jan-1935 Sex: male Admission Date (Current Location): 03/18/2023  [facility] and [place] Number:  Chiropodist and Address:  [facility], [address], [place], [place] [address]      Provider Number: [id number]  Attending Physician Name and Address:  Tresa Moore, MD  Relative Name and Phone Number:       Current Level of Care: Hospital Recommended Level of Care: Skilled Nursing Facility (with hospice) Prior Approval Number:    Date Approved/Denied:   PASRR Number: [id number]  Discharge Plan: SNF (with hospice)    Current Diagnoses: Patient Active Problem List   Diagnosis Date Noted   Oral candida 04/03/2023   Multiple myeloma not having achieved remission (HCC) 03/31/2023   Dehydration 03/31/2023   Protein-calorie malnutrition, severe 03/30/2023   Hypercalcemia 03/18/2023   AKI (acute kidney injury) (HCC) 03/18/2023   Hypokalemia 03/18/2023   Hyponatremia 03/18/2023   Anemia associated with chemotherapy 03/18/2023   Acute ST elevation myocardial infarction (STEMI) involving left anterior descending (LAD) coronary artery (HCC) 06/09/2021   Chronic kidney disease, stage 3a (HCC) 06/09/2021   BPH (benign prostatic hyperplasia) 06/09/2021   Coronary disease 06/09/2021   Gastro-esophageal reflux disease without esophagitis 06/09/2021   Other vitamin B12 deficiency anemias 06/09/2021   Peripheral vascular disease (HCC) 06/09/2021   Type 2 diabetes mellitus without complications (HCC) 06/09/2021   Left hemiparesis (HCC) 01/08/2021    Orientation RESPIRATION BLADDER Height & Weight     Self  Normal Incontinent, External catheter Weight: 127 lb 13.9 oz (58 kg) Height:  5\' 8"  (172.7 cm)  BEHAVIORAL SYMPTOMS/MOOD NEUROLOGICAL BOWEL NUTRITION STATUS     (None) Incontinent Diet (DYS 1. No straws.)  AMBULATORY STATUS  COMMUNICATION OF NEEDS Skin   Extensive Assist   Normal                       Personal Care Assistance Level of Assistance              Functional Limitations Info  Sight, Hearing, Speech     Speech Info: Impaired (Incomprehensible)    SPECIAL CARE FACTORS FREQUENCY                      Contractures Contractures Info: Not present    Additional Factors Info  Code Status, Allergies Code Status Info: DNR Allergies Info: Penicillins           Current Medications (04/06/2023):  This is the current hospital active medication list Current Facility-Administered Medications  Medication Dose Route Frequency Provider Last Rate Last Admin   acetaminophen (TYLENOL) tablet 650 mg  650 mg Oral Q6H PRN Crosley, Debby, MD   650 mg at 04/02/23 1059   Or   acetaminophen (TYLENOL) suppository 650 mg  650 mg Rectal Q6H PRN Crosley, Debby, MD       alum & mag hydroxide-simeth (MAALOX/MYLANTA) 200-200-20 MG/5ML suspension 30 mL  30 mL Oral Q6H PRN Marcelino Duster, MD       atorvastatin (LIPITOR) tablet 80 mg  80 mg Oral Daily Georgeann Oppenheim, Sudheer B, MD   80 mg at 04/06/23 1017   cholecalciferol (VITAMIN D3) 25 MCG (1000 UNIT) tablet 5,000 Units  5,000 Units Oral Daily Lolita Patella B, MD   5,000 Units at 04/06/23 1017   clopidogrel (PLAVIX) tablet 75 mg  75 mg Oral Daily Sreeram,  Lynne Logan, MD   75 mg at 04/06/23 1017   cyanocobalamin (VITAMIN B12) tablet 1,000 mcg  1,000 mcg Oral Daily Rickard Patience, MD   1,000 mcg at 04/06/23 1017   dextrose 50 % solution 12.5 g  12.5 g Intravenous Once PRN Marcelino Duster, MD       docusate sodium (COLACE) capsule 200 mg  200 mg Oral QHS Georgeann Oppenheim, Sudheer B, MD   200 mg at 04/05/23 2103   feeding supplement (ENSURE ENLIVE / ENSURE PLUS) liquid 237 mL  237 mL Oral TID BM Marcelino Duster, MD   237 mL at 04/06/23 1019   heparin injection 5,000 Units  5,000 Units Subcutaneous Q8H Crosley, Debby, MD   5,000 Units at 04/06/23 4098    hydrALAZINE (APRESOLINE) injection 10 mg  10 mg Intravenous Q6H PRN Agbata, Tochukwu, MD       hydrochlorothiazide (HYDRODIURIL) tablet 12.5 mg  12.5 mg Oral Daily Sreenath, Sudheer B, MD   12.5 mg at 04/06/23 1018   latanoprost (XALATAN) 0.005 % ophthalmic solution 1 drop  1 drop Both Eyes QHS Marcelino Duster, MD   1 drop at 04/05/23 2102   multivitamin with minerals tablet 1 tablet  1 tablet Oral Daily Agbata, Tochukwu, MD   1 tablet at 04/06/23 1017   nystatin (MYCOSTATIN) 100000 UNIT/ML suspension 500,000 Units  5 mL Oral QID Lolita Patella B, MD   500,000 Units at 04/06/23 1017   ondansetron (ZOFRAN) tablet 4 mg  4 mg Oral Q6H PRN Crosley, Debby, MD       Or   ondansetron (ZOFRAN) injection 4 mg  4 mg Intravenous Q6H PRN Crosley, Debby, MD       Oral care mouth rinse  15 mL Mouth Rinse PRN Marcelino Duster, MD       Oral care mouth rinse  15 mL Mouth Rinse 4 times per day Agbata, Tochukwu, MD   15 mL at 04/05/23 2103   timolol (TIMOPTIC) 0.5 % ophthalmic solution 1 drop  1 drop Both Eyes BID Marcelino Duster, MD   1 drop at 04/06/23 1019   Facility-Administered Medications Ordered in Other Encounters  Medication Dose Route Frequency Provider Last Rate Last Admin   clopidogrel (PLAVIX) tablet    PRN Marcina Millard, MD   600 mg at 06/09/21 2049     Discharge Medications: Please see discharge summary for a list of discharge medications.  Relevant Imaging Results:  Relevant Lab Results:   Additional Information ss [id number]  Margarito Liner, LCSW

## 2023-04-06 NOTE — Progress Notes (Signed)
OT Cancellation Note  Patient Details Name: Blace Lepre MRN: [id number] DOB: 1935/02/25   Cancelled Treatment:    Chart reviewed with new plan  to receive hospice services at LTC once a bed is found. OT orders to be completed at this time and can be consulted again if new needs arise.  Constance Goltz 04/06/2023, 8:05 AM

## 2023-04-06 NOTE — Progress Notes (Signed)
Palliative Care Progress Note, Assessment & Plan   Patient Name: Hunter Bailey       Date: 04/06/2023 DOB: 06/02/1934  Age: 89 y.o. MRN#: [id number] Attending Physician: Tresa Moore, MD Primary Care Physician: [facility] Admit Date: 03/18/2023  Subjective: Is lying in bed, sleeping, in no apparent distress.  He easily awakens to my presence.  When he sees me, he says "no" points towards the door.  I assured him that I was not there to draw blood, take his vital signs, or poke him in any way.  I shared I was just here to check on him.  He nodded his head yes, closed his eyes, rolled over, and appeared to return back to sleep.  No family or friends present during my visit.  HPI: 89 y.o. male  with past medical history of CVA with left hemiparesis, BPH, CAD, HTN, HLD admitted on 03/18/2023 with decreased p.o. intake X 3 days and lethargy.   [facility] a [facility] [address] [facility] [address] [facility].  Upon presentation to ED, calcium elevated to 13.     Oncology was consulted for suspicion of multiple myeloma.  03/28/2023, patient underwent bone marrow biopsy, which confirmed diagnosis of multiple myeloma involving 80%.   Patient admitted for hypercalcemia and treated with IV hydration, calcitonin, pamidronate, and steroids as per oncology team.   Remains encephalopathic, MRI 1/16 negative for acute intracranial findings   Palliative Medicine consulted for assisting with goals of care conversations.  Summary of counseling/coordination of care: Extensive chart review completed prior to meeting patient including labs, vital signs, imaging, progress notes, orders, and available advanced directive documents from current and previous encounters.   After reviewing the patient's  chart and assessing the patient at bedside, I surmise that patient is not in distress, pain, or experiencing other acute issues at this time.  Nonverbal signs of pain or distress not noted.  No adjustment to [facility] needed for symptom management at this time.  After visiting with the patient, I counseled with attending.  We discussed that patient has plans to discharge with hospice services to follow.  Therefore, clarification needed from family as to whether or not comfort care measures should be initiated in the hospital.  After speaking with attending, I spoke with patient's niece/decision maker [name] over the phone.  Attempted to elicit values and goals important to the patient.  She shares that she wants the patient to be kept as comfortable as possible.  We discussed that blood draws, vital sign checks frequently, and other interventions are not aimed at comfort.  She shares understanding that patient will not get better and that he would not be able to undergo treatment for his myeloma.  Therefore, she shares she is under the impression that all care should be focused on comfort at this time.  I shared that that is not reflected in his current plan of care but that I can make those adjustments today.  She was in agreement and appreciative of my phone call.  We discussed comfort measures. Mr. Helsley would no longer receive aggressive medical interventions such as continuous vital signs, lab work, radiology testing, or medications not focused on  comfort. All care would focus on how he is looking and feeling. This would include management of any symptoms that may cause discomfort, pain, shortness of breath, cough, nausea, agitation, anxiety, and/or secretions etc. Symptoms would be managed with medications and other non-pharmacological interventions such as spiritual support if requested, repositioning, music therapy, or therapeutic listening.   [name] verbalized understanding and appreciation.    Adjustments  made to [facility] made to reflect full comfort measures.    RN and attending notified of shift to full comfort measures.  PMT will continue to follow and support patient and family throughout his hospitalization.  Barrier to discharge remains LTC placement.  TOC following closely.  Physical Exam Vitals reviewed.  HENT:     Head: Normocephalic.     Mouth/Throat:     Mouth: Mucous membranes are moist.  Eyes:     Pupils: Pupils are equal, round, and reactive to light.  Pulmonary:     Effort: Pulmonary effort is normal.  Abdominal:     Palpations: Abdomen is soft.  Musculoskeletal:     Comments: Generalized weakness  Skin:    General: Skin is warm and dry.  Neurological:     Mental Status: He is alert.  Psychiatric:        Mood and Affect: Mood normal.        Behavior: Behavior normal.        Judgment: Judgment normal.             Total Time 50 minutes   Time spent includes: Detailed review of medical records (labs, imaging, vital signs), medically appropriate exam (mental status, respiratory, cardiac, skin), discussed with treatment team, counseling and educating patient, family and staff, documenting clinical information, medication management and coordination of care.  Samara Deist L. Bonita Quin, DNP, FNP-BC Palliative Medicine Team

## 2023-04-06 NOTE — Progress Notes (Signed)
PROGRESS NOTE    Hunter Bailey  NWG:[id number] DOB: 1934-03-16 DOA: 03/18/2023 PCP: [facility]    Brief Narrative:  89 year old gentleman with past medical history significant for HLD, HTN, CAD, BPH, H/o CVA w/ left hemiparesis.  Patient resides in independent living facility.  He was brought in with decreased p.o. liquid intake for the past 3 days. He was lethargic on admission.    In the ER patient's vital stable.  Sodium 130, potassium 3.3, creatinine 2.67 baseline normal, total protein greater than 12, hemoglobin 9.7 [baseline 11.8, done 12/09/2022], platelets 114 [baseline normal]. Calcium elevated to 13. EKG: Atrial fibrillation HR 66, QTc 468.    Patient is admitted for hypercalcemia, started on IV fluids.  Patient's protein is elevated at 12, kidney function worse than baseline.  Oncology evaluated the patient for suspicion of multiple myeloma.  Patient got bone marrow biopsy 03/28/23. His calcium is high started IV hydration, Calcitonin. Pamidronate, steroids per oncology team    Assessment & Plan:   Principal Problem:   Hypercalcemia Active Problems:   BPH (benign prostatic hyperplasia)   Coronary disease   Left hemiparesis (HCC)   Other vitamin B12 deficiency anemias   Type 2 diabetes mellitus without complications (HCC)   AKI (acute kidney injury) (HCC)   Hypokalemia   Hyponatremia   Anemia associated with chemotherapy   Protein-calorie malnutrition, severe   Multiple myeloma not having achieved remission (HCC)   Dehydration   Oral candida  Hypercalcemia  Acute anemia AKI Multiple myeloma confirmed Presented with weakness, lethargy.  Poor oral intake Oncology evaluation is appreciated.  LDH 124, beta-2 microglobulin level ordered.  Elevated kappa light chain with increased light chain ratio. Protein >12 - protein electrophoresis pending. Hb around 8.  Calcium 9.9 following IV hydration, calcitonin therapy, pamidronate and Decadron ordered by oncology  team Plan: Multiple myeloma has been confirmed but patient is not a candidate for chemotherapy and family does not want aggressive treatment at this time     AKI In the setting of possible multiple myeloma. Baseline serum creatinine of 1.20 from 09/24 and on admission it was 2.67 Renal function has improved but not back to baseline Renal ultrasound showed enlarged prostate. Avoid nephrotoxic drugs.  Monitor daily renal function.     Hypokalemia  Hypernatremia Dehydration Patient noted to have very dry mucous membranes and poor skin turgor Slowly improved Supplement potassium Continue to monitor daily electrolytes     Thrombocytopenia  Platelets on the lower side of normal but improved Monitor CBC closely and transfuse as indicated     CAD Continue atorvastatin.  Continue metoprolol, Plavix.     Hyperlipidemia History of CVA with left-sided hemiparesis continue Atorvastatin, Plavix.  Severe malnutrition In the context of chronic illness as evidenced by severe fat depletion, severe muscle depletion. RD following, recs appreciated   DVT prophylaxis: SQ heparin Code Status: DNR Family Communication: None today Disposition Plan: Status is: Inpatient Remains inpatient appropriate because: Unsafe discharge plan.  Need skilled nursing facility   Level of care: Telemetry Medical  Consultants:  None  Procedures:  None  Antimicrobials: None    Subjective: Seen and examined.  Appears bit more confused today.  Resting in bed.  No distress.  Objective: Vitals:   04/05/23 1954 04/06/23 0341 04/06/23 0500 04/06/23 0755  BP: (!) 146/63 101/80  139/79  Pulse: 71 72    Resp: 20 20  18   Temp: (!) 97.5 F (36.4 C) 97.6 F (36.4 C)  97.6 F (36.4 C)  TempSrc: Oral Axillary    SpO2: 99%   100%  Weight:   58 kg   Height:        Intake/Output Summary (Last 24 hours) at 04/06/2023 1108 Last data filed at 04/06/2023 1034 Gross per 24 hour  Intake 357 ml  Output  580 ml  Net -223 ml   Filed Weights   04/01/23 0749 04/04/23 0500 04/06/23 0500  Weight: 42 kg 57 kg 58 kg    Examination:  General exam: Frail and chronically ill Respiratory system: Bibasilar crackles.  Normal work of breathing.  Room air Cardiovascular system: S1-S2, RRR, no murmurs, no pedal edema Gastrointestinal system: Thin, soft, NT/ND, normal bowel sounds Central nervous system: Alert but lethargic.  Unable to assess orientation.  No focal deficits Extremities: Symmetrically decreased power Skin: No rashes, lesions or ulcers Psychiatry: Judgement and insight appear normal. Mood & affect confused.     Data Reviewed: I have personally reviewed following labs and imaging studies  CBC: Recent Labs  Lab 03/31/23 0616 04/01/23 0423  WBC 8.1 8.8  HGB 8.7* 8.4*  HCT 27.9* 26.9*  MCV 97.6 100.7*  PLT 97* 95*   Basic Metabolic Panel: Recent Labs  Lab 03/31/23 0616 04/01/23 0423 04/02/23 0547 04/03/23 0522  NA 153* 149* 149* 144  K 3.2* 3.9 3.4* 3.6  CL 129* 127* 126* 123*  CO2 22 19* 20* 20*  GLUCOSE 119* 184* 121* 116*  BUN 72* 74* 76* 71*  CREATININE 1.93* 2.08* 1.81* 1.67*  CALCIUM 9.8 9.5 9.6 9.3  MG 2.4  --   --   --    GFR: Estimated Creatinine Clearance: 26 mL/min (A) (by C-G formula based on SCr of 1.67 mg/dL (H)). Liver Function Tests: Recent Labs  Lab 03/31/23 0616 04/01/23 0423  AST 16 16  ALT 12 13  ALKPHOS 30* 29*  BILITOT 0.6 0.6  PROT 11.7* 11.6*  ALBUMIN 2.2* 2.0*   No results for input(s): "LIPASE", "AMYLASE" in the last 168 hours. No results for input(s): "AMMONIA" in the last 168 hours. Coagulation Profile: No results for input(s): "INR", "PROTIME" in the last 168 hours. Cardiac Enzymes: No results for input(s): "CKTOTAL", "CKMB", "CKMBINDEX", "TROPONINI" in the last 168 hours. BNP (last 3 results) No results for input(s): "PROBNP" in the last 8760 hours. HbA1C: No results for input(s): "HGBA1C" in the last 72  hours. CBG: Recent Labs  Lab 04/05/23 0555 04/05/23 0821 04/05/23 1417 04/06/23 0018 04/06/23 0549  GLUCAP 123* 92 83 126* 89   Lipid Profile: No results for input(s): "CHOL", "HDL", "LDLCALC", "TRIG", "CHOLHDL", "LDLDIRECT" in the last 72 hours. Thyroid Function Tests: No results for input(s): "TSH", "T4TOTAL", "FREET4", "T3FREE", "THYROIDAB" in the last 72 hours. Anemia Panel: No results for input(s): "VITAMINB12", "FOLATE", "FERRITIN", "TIBC", "IRON", "RETICCTPCT" in the last 72 hours. Sepsis Labs: No results for input(s): "PROCALCITON", "LATICACIDVEN" in the last 168 hours.  No results found for this or any previous visit (from the past 240 hours).       Radiology Studies: No results found.      Scheduled Meds:  atorvastatin  80 mg Oral Daily   cholecalciferol  5,000 Units Oral Daily   clopidogrel  75 mg Oral Daily   vitamin B-12  1,000 mcg Oral Daily   docusate sodium  200 mg Oral QHS   feeding supplement  237 mL Oral TID BM   heparin  5,000 Units Subcutaneous Q8H   hydrochlorothiazide  12.5 mg Oral Daily   latanoprost  1 drop  Both Eyes QHS   multivitamin with minerals  1 tablet Oral Daily   nystatin  5 mL Oral QID   mouth rinse  15 mL Mouth Rinse 4 times per day   timolol  1 drop Both Eyes BID   Continuous Infusions:   LOS: 19 days    Tresa Moore, MD [facility]   If 7PM-7AM, please contact night-coverage  04/06/2023, 11:08 AM

## 2023-04-07 ENCOUNTER — Encounter (HOSPITAL_COMMUNITY): Payer: Self-pay | Admitting: Oncology

## 2023-04-07 DIAGNOSIS — C9000 Multiple myeloma not having achieved remission: Secondary | ICD-10-CM | POA: Diagnosis not present

## 2023-04-07 DIAGNOSIS — Z515 Encounter for palliative care: Secondary | ICD-10-CM | POA: Diagnosis not present

## 2023-04-07 DIAGNOSIS — D518 Other vitamin B12 deficiency anemias: Secondary | ICD-10-CM | POA: Diagnosis not present

## 2023-04-07 LAB — MULTIPLE MYELOMA PANEL, SERUM
Albumin/Glob SerPl: UNDETERMINED
Globulin, Total: UNDETERMINED g/dL
IgA: 58 mg/dL — ABNORMAL LOW (ref 61–437)
IgG (Immunoglobin G), Serum: 11191 mg/dL — ABNORMAL HIGH (ref 603–1613)
IgM (Immunoglobulin M), Srm: 26 mg/dL (ref 15–143)

## 2023-04-07 NOTE — TOC Progression Note (Signed)
Transition of Care [facility]) - Progression Note    Patient Details  Name: Hunter Bailey MRN: [id number] Date of Birth: Jul 14, 1934  [facility] CM/SW Contact  Chapman Fitch, RN Phone Number: 04/07/2023, 10:22 AM  Clinical Narrative:      - Spoke with Adella Nissen at [place] - she states they would be able to offer with LTC Medicaid pending and patient would be required to pay $1000 - VM left for Brianna at [facility] to determine how much patient would owe upfront - Spoke with Wynona Canes with [employer] and [employer] - they do not have medicaid beds available     Updated Niece [name].  She would like to move forward with Lacinda Axon, however she doesn't have $1000 for the deposit, and doesn't have access to the patient's accounts.  Grover Canavan at [place] notified and will run an asset check    Expected Discharge Plan and Services                                               Social Determinants of Health (SDOH) Interventions SDOH Screenings   Food Insecurity: Patient Unable To Answer (03/22/2023)  Housing: Patient Unable To Answer (03/22/2023)  Transportation Needs: Patient Unable To Answer (03/22/2023)  Utilities: Patient Unable To Answer (03/22/2023)  Social Connections: Unknown (03/22/2023)  Tobacco Use: Medium Risk (03/23/2023)    Readmission Risk Interventions     No data to display

## 2023-04-07 NOTE — Progress Notes (Signed)
                                                                                                                                                                                                           Daily Progress Note   Patient Name: Hunter Bailey       Date: 04/07/2023 DOB: 1934/07/03  Age: 89 y.o. MRN#: [id number] Attending Physician: Tresa Moore, MD Primary Care Physician: [facility] Admit Date: 03/18/2023  Reason for Consultation/Follow-up: Establishing goals of care  HPI/Brief Hospital Review: 89 y.o. male  with past medical history of CVA with left hemiparesis, BPH, CAD, HTN, HLD admitted on 03/18/2023 with decreased p.o. intake X 3 days and lethargy.   Patient is a resident [address] [facility] [address] [facility].  Upon presentation to ED, calcium elevated to 13.     Oncology was consulted for suspicion of multiple myeloma.  03/28/2023, patient underwent bone marrow biopsy, which confirmed diagnosis of multiple myeloma involving 80%.   Patient admitted for hypercalcemia and treated with IV hydration, calcitonin, pamidronate, and steroids as per oncology team.   Remains encephalopathic, MRI 1/16 negative for acute intracranial findings   Palliative Medicine consulted for assisting with goals of care conversations.  Plan to discharge to LTC with hospice following, transitioned to full comfort measures 1/23  Subjective: Extensive chart review has been completed prior to meeting patient including labs, vital signs, imaging, progress notes, orders, and available advanced directive documents from current and previous encounters.    Visited with Hunter Bailey at his bedside. He is awake, alert, unable to communicate needs. He is resting in bed calmly, respirations even and unlabored, no signs of acute distress or discomfort. No family at bedside during time of visit.  Review of MAR, current regimen maintaining comfort, no recommendations for medication  adjustments at this time.  TOC actively engaged in LTC bed search. PMT will continue to follow for ongoing needs and support.  Thank you for allowing the Palliative Medicine Team to assist in the care of this patient.  Total time:  25 minutes  Time spent includes: Detailed review of medical records (labs, imaging, vital signs), medically appropriate exam (mental status, respiratory, cardiac, skin), discussed with treatment team, counseling and educating patient, family and staff, documenting clinical information, medication management and coordination of care.  [name], DNP, AGNP-C Palliative Medicine   Please contact Palliative Medicine Team phone at [phone] for questions and concerns.

## 2023-04-07 NOTE — Plan of Care (Signed)

## 2023-04-07 NOTE — Progress Notes (Signed)
[facility] Liaison Note    Bed search continues for LTC.   Plan will be to receive hospice services at LTC once a bed is found.   Hospital Liaison Team will continue to follow.   Please do not hesitate to call with any hospice related questions or concerns.   [facility] Liaison [phone]

## 2023-04-07 NOTE — TOC Progression Note (Signed)
Transition of Care [facility]) - Progression Note    Patient Details  Name: Hunter Bailey MRN: [id number] Date of Birth: 12/05/1934  [facility]) CM/SW Contact  Margarito Liner, LCSW Phone Number: 04/07/2023, 10:11 AM  Clinical Narrative:   LATE ENTRY: CSW spoke to admissions coordinator Adella Nissen) at [place] and admissions coordinator Colin Mulders) at [facility] yesterday. They are able to accept patient for LTC with hospice.  Expected Discharge Plan and Services                                               Social Determinants of Health (SDOH) Interventions SDOH Screenings   Food Insecurity: Patient Unable To Answer (03/22/2023)  Housing: Patient Unable To Answer (03/22/2023)  Transportation Needs: Patient Unable To Answer (03/22/2023)  Utilities: Patient Unable To Answer (03/22/2023)  Social Connections: Unknown (03/22/2023)  Tobacco Use: Medium Risk (03/23/2023)    Readmission Risk Interventions     No data to display

## 2023-04-07 NOTE — TOC Progression Note (Signed)
Transition of Care [facility]) - Progression Note    Patient Details  Name: Hunter Bailey MRN: [id number] Date of Birth: 07/27/1934  Transition of Care [facility]) CM/SW Contact  Chapman Fitch, RN Phone Number: 04/07/2023, 5:30 PM  Clinical Narrative:        Per Grover Canavan at [place] patient has 4 cars in his name when assets  were check, and they will not be able to offer a bed unless family can private pay  Per Toniann Fail at [facility] they have completed an asset check and confirm they can admit patient with LTC medicaid pending, will not owe out of pocket for January, and facility will start receiving patients check in February.  Their preferred hospice agency is [facility], and they can accept patient on Monday  Updated niece [name].  She is in agreement and would like to accept bed at [facility].  She does not have preference of hospice agency   Accepted bed in [place], and notified Toniann Fail at [facility]  It is currently after business hours, so TOC will follow up with [facility] hosp[facility] during business hours     Expected Discharge Plan and Services                                               Social Determinants of Health (SDOH) Interventions SDOH Screenings   Food Insecurity: Patient Unable To Answer (03/22/2023)  Housing: Patient Unable To Answer (03/22/2023)  Transportation Needs: Patient Unable To Answer (03/22/2023)  Utilities: Patient Unable To Answer (03/22/2023)  Social Connections: Unknown (03/22/2023)  Tobacco Use: Medium Risk (03/23/2023)    Readmission Risk Interventions     No data to display

## 2023-04-07 NOTE — Progress Notes (Signed)
PROGRESS NOTE    Hunter Bailey  GNF:[id number] DOB: 1934-05-22 DOA: 03/18/2023 PCP: [facility]    Brief Narrative:  89 year old gentleman with past medical history significant for HLD, HTN, CAD, BPH, H/o CVA w/ left hemiparesis.  Patient resides in independent living facility.  He was brought in with decreased p.o. liquid intake for the past 3 days. He was lethargic on admission.    In the ER patient's vital stable.  Sodium 130, potassium 3.3, creatinine 2.67 baseline normal, total protein greater than 12, hemoglobin 9.7 [baseline 11.8, done 12/09/2022], platelets 114 [baseline normal]. Calcium elevated to 13. EKG: Atrial fibrillation HR 66, QTc 468.    Patient is admitted for hypercalcemia, started on IV fluids.  Patient's protein is elevated at 12, kidney function worse than baseline.  Oncology evaluated the patient for suspicion of multiple myeloma.  Patient got bone marrow biopsy 03/28/23. His calcium is high started IV hydration, Calcitonin. Pamidronate, steroids per oncology team    Assessment & Plan:   Principal Problem:   Hypercalcemia Active Problems:   BPH (benign prostatic hyperplasia)   Coronary disease   Left hemiparesis (HCC)   Other vitamin B12 deficiency anemias   Type 2 diabetes mellitus without complications (HCC)   AKI (acute kidney injury) (HCC)   Hypokalemia   Hyponatremia   Anemia associated with chemotherapy   Protein-calorie malnutrition, severe   Multiple myeloma not having achieved remission (HCC)   Dehydration   Oral candida  Hypercalcemia  Acute anemia AKI Multiple myeloma confirmed Presented with weakness, lethargy.  Poor oral intake Oncology evaluation is appreciated.  LDH 124, beta-2 microglobulin level ordered.  Elevated kappa light chain with increased light chain ratio. Protein >12 - protein electrophoresis pending. Hb around 8.  Calcium 9.9 following IV hydration, calcitonin therapy, pamidronate and Decadron ordered by oncology  team Plan: Multiple myeloma has been confirmed but patient is not a candidate for chemotherapy and family does not want aggressive treatment at this time.  Discussed with palliative care.  Made full comfort measures on 1/23.  All medications not focused on patient comfort have been discontinued.  Pending placement at long-term care bed with hospice services.    DVT prophylaxis: SQ heparin Code Status: DNR Family Communication: None today Disposition Plan: Status is: Inpatient Remains inpatient appropriate because: Unsafe discharge plan.  Need skilled nursing facility   Level of care: Telemetry Medical  Consultants:  None  Procedures:  None  Antimicrobials: None    Subjective: Seen and examined.  No visible distress.  Appears confused at baseline  Objective: Vitals:   04/06/23 0755 04/06/23 2020 04/06/23 2114 04/07/23 0511  BP: 139/79 (!) 143/78  123/78  Pulse:  (!) 57 96 62  Resp: 18 20  20   Temp: 97.6 F (36.4 C) 97.6 F (36.4 C)  97.6 F (36.4 C)  TempSrc:  Oral  Oral  SpO2: 100% (!) 83% 99% 92%  Weight:      Height:        Intake/Output Summary (Last 24 hours) at 04/07/2023 1256 Last data filed at 04/07/2023 1000 Gross per 24 hour  Intake 120 ml  Output 600 ml  Net -480 ml   Filed Weights   04/01/23 0749 04/04/23 0500 04/06/23 0500  Weight: 42 kg 57 kg 58 kg    Examination:  General exam: Frail appearing Respiratory system: Bibasilar crackles.  Normal work of breathing.  Room air Cardiovascular system: S1-S2, RRR, no murmurs, no pedal edema Gastrointestinal system: Thin, soft, NT/ND, normal bowel  sounds Central nervous system: Alert but lethargic.  Unable to assess orientation.  No focal deficits Extremities: Symmetrically decreased power Skin: No rashes, lesions or ulcers Psychiatry: Judgement and insight appear normal. Mood & affect confused.     Data Reviewed: I have personally reviewed following labs and imaging studies  CBC: Recent Labs   Lab 04/01/23 0423  WBC 8.8  HGB 8.4*  HCT 26.9*  MCV 100.7*  PLT 95*   Basic Metabolic Panel: Recent Labs  Lab 04/01/23 0423 04/02/23 0547 04/03/23 0522  NA 149* 149* 144  K 3.9 3.4* 3.6  CL 127* 126* 123*  CO2 19* 20* 20*  GLUCOSE 184* 121* 116*  BUN 74* 76* 71*  CREATININE 2.08* 1.81* 1.67*  CALCIUM 9.5 9.6 9.3   GFR: Estimated Creatinine Clearance: 26 mL/min (A) (by C-G formula based on SCr of 1.67 mg/dL (H)). Liver Function Tests: Recent Labs  Lab 04/01/23 0423  AST 16  ALT 13  ALKPHOS 29*  BILITOT 0.6  PROT 11.6*  ALBUMIN 2.0*   No results for input(s): "LIPASE", "AMYLASE" in the last 168 hours. No results for input(s): "AMMONIA" in the last 168 hours. Coagulation Profile: No results for input(s): "INR", "PROTIME" in the last 168 hours. Cardiac Enzymes: No results for input(s): "CKTOTAL", "CKMB", "CKMBINDEX", "TROPONINI" in the last 168 hours. BNP (last 3 results) No results for input(s): "PROBNP" in the last 8760 hours. HbA1C: No results for input(s): "HGBA1C" in the last 72 hours. CBG: Recent Labs  Lab 04/05/23 0555 04/05/23 0821 04/05/23 1417 04/06/23 0018 04/06/23 0549  GLUCAP 123* 92 83 126* 89   Lipid Profile: No results for input(s): "CHOL", "HDL", "LDLCALC", "TRIG", "CHOLHDL", "LDLDIRECT" in the last 72 hours. Thyroid Function Tests: No results for input(s): "TSH", "T4TOTAL", "FREET4", "T3FREE", "THYROIDAB" in the last 72 hours. Anemia Panel: No results for input(s): "VITAMINB12", "FOLATE", "FERRITIN", "TIBC", "IRON", "RETICCTPCT" in the last 72 hours. Sepsis Labs: No results for input(s): "PROCALCITON", "LATICACIDVEN" in the last 168 hours.  No results found for this or any previous visit (from the past 240 hours).       Radiology Studies: No results found.      Scheduled Meds:  vitamin B-12  1,000 mcg Oral Daily   docusate sodium  200 mg Oral QHS   feeding supplement  237 mL Oral TID BM   hydrochlorothiazide  12.5 mg  Oral Daily   latanoprost  1 drop Both Eyes QHS   nystatin  5 mL Oral QID   mouth rinse  15 mL Mouth Rinse 4 times per day   timolol  1 drop Both Eyes BID   Continuous Infusions:   LOS: 20 days    Tresa Moore, MD [facility]   If 7PM-7AM, please contact night-coverage  04/07/2023, 12:56 PM

## 2023-04-07 NOTE — Plan of Care (Signed)
Problem: Clinical Measurements: Goal: Ability to maintain clinical measurements within normal limits will improve Outcome: Progressing Goal: Will remain free from infection Outcome: Progressing Goal: Diagnostic test results will improve Outcome: Progressing Goal: Respiratory complications will improve Outcome: Progressing Goal: Cardiovascular complication will be avoided Outcome: Progressing   Problem: Pain Management: Goal: General experience of comfort will improve Outcome: Progressing

## 2023-04-08 DIAGNOSIS — E860 Dehydration: Secondary | ICD-10-CM | POA: Diagnosis not present

## 2023-04-08 DIAGNOSIS — Z515 Encounter for palliative care: Secondary | ICD-10-CM | POA: Diagnosis not present

## 2023-04-08 DIAGNOSIS — C9000 Multiple myeloma not having achieved remission: Secondary | ICD-10-CM | POA: Diagnosis not present

## 2023-04-08 NOTE — Progress Notes (Signed)
                                                                                                                                                                                                           Daily Progress Note   Patient Name: Hunter Bailey       Date: 04/08/2023 DOB: 05-15-1934  Age: 89 y.o. MRN#: [id number] Attending Physician: Tresa Moore, MD Primary Care Physician: [facility] Admit Date: 03/18/2023  Reason for Consultation/Follow-up: Establishing goals of care  HPI/Brief Hospital Review: 89 y.o. male  with past medical history of CVA with left hemiparesis, BPH, CAD, HTN, HLD admitted on 03/18/2023 with decreased p.o. intake X 3 days and lethargy.   Patient is a resident [address] [facility] [address] [facility].  Upon presentation to ED, calcium elevated to 13.     Oncology was consulted for suspicion of multiple myeloma.  03/28/2023, patient underwent bone marrow biopsy, which confirmed diagnosis of multiple myeloma involving 80%.   Patient admitted for hypercalcemia and treated with IV hydration, calcitonin, pamidronate, and steroids as per oncology team.   Remains encephalopathic, MRI 1/16 negative for acute intracranial findings   Palliative Medicine consulted for assisting with goals of care conversations.   Plan to discharge to LTC with hospice following, transitioned to full comfort measures 1/23   Subjective: Extensive chart review has been completed prior to meeting patient including labs, vital signs, imaging, progress notes, orders, and available advanced directive documents from current and previous encounters.    Visited with Mr. Arnall at his bedside. He is resting in bed with eyes closed, does not acknowledge my presence in room. Appears comfortable, respirations even and unlabored, no obvious signs of pain or discomfort.  Per chart review, family has accepted LTC bed, with likely transfer Monday.  Review of MAR, current regimen  maintaining comfort, no recommendations to adjustments or changes to current regimen.  PMT will continue to follow for ongoing needs and support.  Thank you for allowing the Palliative Medicine Team to assist in the care of this patient.  Total time:  25 minutes  Time spent includes: Detailed review of medical records (labs, imaging, vital signs), medically appropriate exam (mental status, respiratory, cardiac, skin), discussed with treatment team, counseling and educating patient, family and staff, documenting clinical information, medication management and coordination of care.  [name], DNP, AGNP-C Palliative Medicine   Please contact Palliative Medicine Team phone at [phone] for questions and concerns.

## 2023-04-08 NOTE — Progress Notes (Signed)
\ PROGRESS NOTE    Hunter Bailey  ZOX:[id number] DOB: Dec 10, 1934 DOA: 03/18/2023 PCP: [facility]    Brief Narrative:  89 year old gentleman with past medical history significant for HLD, HTN, CAD, BPH, H/o CVA w/ left hemiparesis.  Patient resides in independent living facility.  He was brought in with decreased p.o. liquid intake for the past 3 days. He was lethargic on admission.    In the ER patient's vital stable.  Sodium 130, potassium 3.3, creatinine 2.67 baseline normal, total protein greater than 12, hemoglobin 9.7 [baseline 11.8, done 12/09/2022], platelets 114 [baseline normal]. Calcium elevated to 13. EKG: Atrial fibrillation HR 66, QTc 468.    Patient is admitted for hypercalcemia, started on IV fluids.  Patient's protein is elevated at 12, kidney function worse than baseline.  Oncology evaluated the patient for suspicion of multiple myeloma.  Patient got bone marrow biopsy 03/28/23. His calcium is high started IV hydration, Calcitonin. Pamidronate, steroids per oncology team    Assessment & Plan:   Principal Problem:   Hypercalcemia Active Problems:   BPH (benign prostatic hyperplasia)   Coronary disease   Left hemiparesis (HCC)   Other vitamin B12 deficiency anemias   Type 2 diabetes mellitus without complications (HCC)   AKI (acute kidney injury) (HCC)   Hypokalemia   Hyponatremia   Anemia associated with chemotherapy   Protein-calorie malnutrition, severe   Multiple myeloma not having achieved remission (HCC)   Dehydration   Oral candida  Hypercalcemia  Acute anemia AKI Multiple myeloma confirmed Presented with weakness, lethargy.  Poor oral intake Oncology evaluation is appreciated.  LDH 124, beta-2 microglobulin level ordered.  Elevated kappa light chain with increased light chain ratio. Protein >12 - protein electrophoresis pending. Hb around 8.  Calcium 9.9 following IV hydration, calcitonin therapy, pamidronate and Decadron ordered by oncology  team Plan: Multiple myeloma has been confirmed but patient is not a candidate for chemotherapy and family does not want aggressive treatment at this time.  Discussed with palliative care.  Made full comfort measures on 1/23.  All medications not focused on patient comfort have been discontinued.  Patient's family has accepted bed at [facility].  Plan for discharge early next week.    DVT prophylaxis: SQ heparin Code Status: DNR Family Communication: None today Disposition Plan: Status is: Inpatient Remains inpatient appropriate because: Unsafe discharge plan.  Need skilled nursing facility   Level of care: Telemetry Medical  Consultants:  None  Procedures:  None  Antimicrobials: None    Subjective: Seen examined.  No visible distress.  Objective: Vitals:   04/07/23 0511 04/07/23 1805 04/07/23 1940 04/08/23 0742  BP: 123/78 135/81 108/60 119/60  Pulse: 62 64 65 81  Resp: 20 18 20 18   Temp: 97.6 F (36.4 C) 98.4 F (36.9 C) 97.9 F (36.6 C) 98.3 F (36.8 C)  TempSrc: Oral  Oral   SpO2: 92%  98% 100%  Weight:      Height:        Intake/Output Summary (Last 24 hours) at 04/08/2023 1149 Last data filed at 04/07/2023 1421 Gross per 24 hour  Intake 60 ml  Output --  Net 60 ml   Filed Weights   04/01/23 0749 04/04/23 0500 04/06/23 0500  Weight: 42 kg 57 kg 58 kg    Examination:  General exam: Appears frail Respiratory system: Bibasilar crackles.  Normal work of breathing.  Room air Cardiovascular system: S1-S2, RRR, no murmurs, no pedal edema Gastrointestinal system: Thin, soft, NT/ND, normal bowel  sounds Central nervous system: Alert but lethargic.  Unable to assess orientation.  No focal deficits Extremities: Symmetrically decreased power Skin: No rashes, lesions or ulcers Psychiatry: Judgement and insight appear normal. Mood & affect confused.     Data Reviewed: I have personally reviewed following labs and imaging studies  CBC: No results for  input(s): "WBC", "NEUTROABS", "HGB", "HCT", "MCV", "PLT" in the last 168 hours.  Basic Metabolic Panel: Recent Labs  Lab 04/02/23 0547 04/03/23 0522  NA 149* 144  K 3.4* 3.6  CL 126* 123*  CO2 20* 20*  GLUCOSE 121* 116*  BUN 76* 71*  CREATININE 1.81* 1.67*  CALCIUM 9.6 9.3   GFR: Estimated Creatinine Clearance: 26 mL/min (A) (by C-G formula based on SCr of 1.67 mg/dL (H)). Liver Function Tests: No results for input(s): "AST", "ALT", "ALKPHOS", "BILITOT", "PROT", "ALBUMIN" in the last 168 hours.  No results for input(s): "LIPASE", "AMYLASE" in the last 168 hours. No results for input(s): "AMMONIA" in the last 168 hours. Coagulation Profile: No results for input(s): "INR", "PROTIME" in the last 168 hours. Cardiac Enzymes: No results for input(s): "CKTOTAL", "CKMB", "CKMBINDEX", "TROPONINI" in the last 168 hours. BNP (last 3 results) No results for input(s): "PROBNP" in the last 8760 hours. HbA1C: No results for input(s): "HGBA1C" in the last 72 hours. CBG: Recent Labs  Lab 04/05/23 0555 04/05/23 0821 04/05/23 1417 04/06/23 0018 04/06/23 0549  GLUCAP 123* 92 83 126* 89   Lipid Profile: No results for input(s): "CHOL", "HDL", "LDLCALC", "TRIG", "CHOLHDL", "LDLDIRECT" in the last 72 hours. Thyroid Function Tests: No results for input(s): "TSH", "T4TOTAL", "FREET4", "T3FREE", "THYROIDAB" in the last 72 hours. Anemia Panel: No results for input(s): "VITAMINB12", "FOLATE", "FERRITIN", "TIBC", "IRON", "RETICCTPCT" in the last 72 hours. Sepsis Labs: No results for input(s): "PROCALCITON", "LATICACIDVEN" in the last 168 hours.  No results found for this or any previous visit (from the past 240 hours).       Radiology Studies: No results found.      Scheduled Meds:  vitamin B-12  1,000 mcg Oral Daily   docusate sodium  200 mg Oral QHS   feeding supplement  237 mL Oral TID BM   hydrochlorothiazide  12.5 mg Oral Daily   latanoprost  1 drop Both Eyes QHS    nystatin  5 mL Oral QID   mouth rinse  15 mL Mouth Rinse 4 times per day   timolol  1 drop Both Eyes BID   Continuous Infusions:   LOS: 21 days    Tresa Moore, MD [facility]   If 7PM-7AM, please contact night-coverage  04/08/2023, 11:49 AM

## 2023-04-08 NOTE — Plan of Care (Signed)
Problem: Activity: Goal: Risk for activity intolerance will decrease Outcome: Not Progressing

## 2023-04-09 DIAGNOSIS — Z515 Encounter for palliative care: Secondary | ICD-10-CM | POA: Diagnosis not present

## 2023-04-09 DIAGNOSIS — C9000 Multiple myeloma not having achieved remission: Secondary | ICD-10-CM | POA: Diagnosis not present

## 2023-04-09 DIAGNOSIS — E860 Dehydration: Secondary | ICD-10-CM | POA: Diagnosis not present

## 2023-04-09 NOTE — Plan of Care (Signed)
  Problem: Education: Goal: Knowledge of General Education information will improve Description: Including pain rating scale, medication(s)/side effects and non-pharmacologic comfort measures Outcome: Progressing   Problem: Health Behavior/Discharge Planning: Goal: Ability to manage health-related needs will improve Outcome: Progressing   Problem: Clinical Measurements: Goal: Ability to maintain clinical measurements within normal limits will improve Outcome: Progressing Goal: Will remain free from infection Outcome: Progressing Goal: Diagnostic test results will improve Outcome: Progressing Goal: Respiratory complications will improve Outcome: Progressing Goal: Cardiovascular complication will be avoided Outcome: Progressing   Problem: Activity: Goal: Risk for activity intolerance will decrease Outcome: Progressing   Problem: Nutrition: Goal: Adequate nutrition will be maintained Outcome: Progressing   Problem: Coping: Goal: Level of anxiety will decrease Outcome: Progressing   Problem: Elimination: Goal: Will not experience complications related to bowel motility Outcome: Progressing Goal: Will not experience complications related to urinary retention Outcome: Progressing   Problem: Pain Management: Goal: General experience of comfort will improve Outcome: Progressing   Problem: Safety: Goal: Ability to remain free from injury will improve Outcome: Progressing   Problem: Skin Integrity: Goal: Risk for impaired skin integrity will decrease Outcome: Progressing   Problem: Education: Goal: Knowledge of the prescribed therapeutic regimen will improve Outcome: Progressing   Problem: Coping: Goal: Ability to identify and develop effective coping behavior will improve Outcome: Progressing   Problem: Clinical Measurements: Goal: Quality of life will improve Outcome: Progressing   Problem: Respiratory: Goal: Verbalizations of increased ease of respirations  will increase Outcome: Progressing   Problem: Role Relationship: Goal: Family's ability to cope with current situation will improve Outcome: Progressing Goal: Ability to verbalize concerns, feelings, and thoughts to partner or family member will improve Outcome: Progressing   Problem: Pain Management: Goal: Satisfaction with pain management regimen will improve Outcome: Progressing

## 2023-04-09 NOTE — Progress Notes (Signed)
[facility] Liaison Note  Follow up on referral for hospice at LTC.  Patient with tentative plans to discharge Monday to [facility].  We originally received hospice referral- however, Maple Lucas Mallow prefers [facility].  Per Bevelyn Ngo, TOC - patient's niece Giovanni Biby did not have a preference for hospice.  Will continue to follow through final disposition and able to speak with Judeth Cornfield to finalize patients disposition.  Please do not hesitate to call with any questions or concerns.  Norris Cross, RN Nurse Liaison [phone]

## 2023-04-09 NOTE — Plan of Care (Signed)
Patient is refusing VS and not following simple commands, violent when touch him for interventions. MD aware     Problem: Education: Goal: Knowledge of General Education information will improve Description: Including pain rating scale, medication(s)/side effects and non-pharmacologic comfort measures Outcome: Not Progressing   Problem: Activity: Goal: Risk for activity intolerance will decrease Outcome: Not Progressing   Problem: Nutrition: Goal: Adequate nutrition will be maintained Outcome: Not Progressing   Problem: Skin Integrity: Goal: Risk for impaired skin integrity will decrease Outcome: Not Progressing

## 2023-04-09 NOTE — Progress Notes (Signed)
\ PROGRESS NOTE    Hunter Bailey  GNF:[id number] DOB: September 05, 1934 DOA: 03/18/2023 PCP: [facility]    Brief Narrative:  89 year old gentleman with past medical history significant for HLD, HTN, CAD, BPH, H/o CVA w/ left hemiparesis.  Patient resides in independent living facility.  He was brought in with decreased p.o. liquid intake for the past 3 days. He was lethargic on admission.    In the ER patient's vital stable.  Sodium 130, potassium 3.3, creatinine 2.67 baseline normal, total protein greater than 12, hemoglobin 9.7 [baseline 11.8, done 12/09/2022], platelets 114 [baseline normal]. Calcium elevated to 13. EKG: Atrial fibrillation HR 66, QTc 468.    Patient is admitted for hypercalcemia, started on IV fluids.  Patient's protein is elevated at 12, kidney function worse than baseline.  Oncology evaluated the patient for suspicion of multiple myeloma.  Patient got bone marrow biopsy 03/28/23. His calcium is high started IV hydration, Calcitonin. Pamidronate, steroids per oncology team    Assessment & Plan:   Principal Problem:   Hypercalcemia Active Problems:   BPH (benign prostatic hyperplasia)   Coronary disease   Left hemiparesis (HCC)   Other vitamin B12 deficiency anemias   Type 2 diabetes mellitus without complications (HCC)   AKI (acute kidney injury) (HCC)   Hypokalemia   Hyponatremia   Anemia associated with chemotherapy   Protein-calorie malnutrition, severe   Multiple myeloma not having achieved remission (HCC)   Dehydration   Oral candida  Hypercalcemia  Acute anemia AKI Multiple myeloma confirmed Presented with weakness, lethargy.  Poor oral intake Oncology evaluation is appreciated.  LDH 124, beta-2 microglobulin level ordered.  Elevated kappa light chain with increased light chain ratio. Protein >12 - protein electrophoresis pending. Hb around 8.  Calcium 9.9 following IV hydration, calcitonin therapy, pamidronate and Decadron ordered by oncology  team Plan: Multiple myeloma has been confirmed but patient is not a candidate for chemotherapy and family does not want aggressive treatment at this time.  Discussed with palliative care.  Made full comfort measures on 1/23.  All medications not focused on patient comfort have been discontinued.  Patient's family has accepted bed at [facility].  Hopeful to discharge Monday 1/27    DVT prophylaxis: SQ heparin Code Status: DNR Family Communication: None today Disposition Plan: Status is: Inpatient Remains inpatient appropriate because: Unsafe discharge plan.  Plan to discharge to long-term care with hospice early next week   Level of care: Telemetry Medical  Consultants:  None  Procedures:  None  Antimicrobials: None    Subjective: Seen and examined.  Lethargic this morning.  Objective: Vitals:   04/08/23 0742 04/08/23 1439 04/08/23 2003 04/09/23 0310  BP: 119/60 130/67 132/69 130/73  Pulse: 81 73 67   Resp: 18 18 20 16   Temp: 98.3 F (36.8 C) 98.3 F (36.8 C) 98.1 F (36.7 C) 97.7 F (36.5 C)  TempSrc:   Axillary   SpO2: 100% 99% 97% 93%  Weight:      Height:       No intake or output data in the 24 hours ending 04/09/23 1213  Filed Weights   04/01/23 0749 04/04/23 0500 04/06/23 0500  Weight: 42 kg 57 kg 58 kg    Examination:  General exam: Frail appearing Respiratory system: Bibasilar crackles.  Normal work of breathing.  Room air Cardiovascular system: S1-S2, RRR, no murmurs, no pedal edema Gastrointestinal system: Thin, soft, NT/ND, normal bowel sounds Central nervous system: Alert but lethargic.  Unable to assess orientation.  No focal deficits Extremities: Symmetrically decreased power Skin: No rashes, lesions or ulcers Psychiatry: Judgement and insight appear normal. Mood & affect confused.     Data Reviewed: I have personally reviewed following labs and imaging studies  CBC: No results for input(s): "WBC", "NEUTROABS", "HGB", "HCT", "MCV",  "PLT" in the last 168 hours.  Basic Metabolic Panel: Recent Labs  Lab 04/03/23 0522  NA 144  K 3.6  CL 123*  CO2 20*  GLUCOSE 116*  BUN 71*  CREATININE 1.67*  CALCIUM 9.3   GFR: Estimated Creatinine Clearance: 26 mL/min (A) (by C-G formula based on SCr of 1.67 mg/dL (H)). Liver Function Tests: No results for input(s): "AST", "ALT", "ALKPHOS", "BILITOT", "PROT", "ALBUMIN" in the last 168 hours.  No results for input(s): "LIPASE", "AMYLASE" in the last 168 hours. No results for input(s): "AMMONIA" in the last 168 hours. Coagulation Profile: No results for input(s): "INR", "PROTIME" in the last 168 hours. Cardiac Enzymes: No results for input(s): "CKTOTAL", "CKMB", "CKMBINDEX", "TROPONINI" in the last 168 hours. BNP (last 3 results) No results for input(s): "PROBNP" in the last 8760 hours. HbA1C: No results for input(s): "HGBA1C" in the last 72 hours. CBG: Recent Labs  Lab 04/05/23 0555 04/05/23 0821 04/05/23 1417 04/06/23 0018 04/06/23 0549  GLUCAP 123* 92 83 126* 89   Lipid Profile: No results for input(s): "CHOL", "HDL", "LDLCALC", "TRIG", "CHOLHDL", "LDLDIRECT" in the last 72 hours. Thyroid Function Tests: No results for input(s): "TSH", "T4TOTAL", "FREET4", "T3FREE", "THYROIDAB" in the last 72 hours. Anemia Panel: No results for input(s): "VITAMINB12", "FOLATE", "FERRITIN", "TIBC", "IRON", "RETICCTPCT" in the last 72 hours. Sepsis Labs: No results for input(s): "PROCALCITON", "LATICACIDVEN" in the last 168 hours.  No results found for this or any previous visit (from the past 240 hours).       Radiology Studies: No results found.      Scheduled Meds:  vitamin B-12  1,000 mcg Oral Daily   docusate sodium  200 mg Oral QHS   feeding supplement  237 mL Oral TID BM   hydrochlorothiazide  12.5 mg Oral Daily   latanoprost  1 drop Both Eyes QHS   nystatin  5 mL Oral QID   mouth rinse  15 mL Mouth Rinse 4 times per day   timolol  1 drop Both Eyes BID    Continuous Infusions:   LOS: 22 days    Tresa Moore, MD [facility]   If 7PM-7AM, please contact night-coverage  04/09/2023, 12:13 PM

## 2023-04-09 NOTE — Progress Notes (Signed)
                                                                                                                                                                                                           Daily Progress Note   Patient Name: Hunter Bailey       Date: 04/09/2023 DOB: September 29, 1934  Age: 89 y.o. MRN#: [id number] Attending Physician: Tresa Moore, MD Primary Care Physician: [facility] Admit Date: 03/18/2023  Reason for Consultation/Follow-up: Establishing goals of care  HPI/Brief Hospital Review: 89 y.o. male  with past medical history of CVA with left hemiparesis, BPH, CAD, HTN, HLD admitted on 03/18/2023 with decreased p.o. intake X 3 days and lethargy.   Patient is a resident of [facility] [address] [facility].  Upon presentation to ED, calcium elevated to 13.     Oncology was consulted for suspicion of multiple myeloma.  03/28/2023, patient underwent bone marrow biopsy, which confirmed diagnosis of multiple myeloma involving 80%.   Patient admitted for hypercalcemia and treated with IV hydration, calcitonin, pamidronate, and steroids as per oncology team.   Remains encephalopathic, MRI 1/16 negative for acute intracranial findings   Palliative Medicine consulted for assisting with goals of care conversations.   Plan to discharge to LTC with hospice following, transitioned to full comfort measures 1/23   Subjective: Extensive chart review has been completed prior to meeting patient including labs, vital signs, imaging, progress notes, orders, and available advanced directive   Visited with Hunter Bailey at his bedside. He is awake in bed, unable to communicate, appears comfortable, no signs of pain or discomfort. Breakfast at bedside untouched.  Anticipating discharge to LTC with hospice following.  Review of MAR, current regimen maintaining comfort, no recommendations to adjustments or changes to current regimen.   PMT will follow  peripherally at this time, please reengage as appropriate.  Thank you for allowing the Palliative Medicine Team to assist in the care of this patient.  Total time:  25 minutes  Time spent includes: Detailed review of medical records (labs, imaging, vital signs), medically appropriate exam (mental status, respiratory, cardiac, skin), discussed with treatment team, counseling and educating patient, family and staff, documenting clinical information, medication management and coordination of care.  [name], DNP, AGNP-C Palliative Medicine   Please contact Palliative Medicine Team phone at [phone] for questions and concerns.

## 2023-04-10 DIAGNOSIS — K5901 Slow transit constipation: Secondary | ICD-10-CM | POA: Diagnosis not present

## 2023-04-10 DIAGNOSIS — I639 Cerebral infarction, unspecified: Secondary | ICD-10-CM | POA: Diagnosis not present

## 2023-04-10 DIAGNOSIS — I739 Peripheral vascular disease, unspecified: Secondary | ICD-10-CM | POA: Diagnosis not present

## 2023-04-10 DIAGNOSIS — N3289 Other specified disorders of bladder: Secondary | ICD-10-CM | POA: Diagnosis not present

## 2023-04-10 DIAGNOSIS — N1831 Chronic kidney disease, stage 3a: Secondary | ICD-10-CM | POA: Diagnosis not present

## 2023-04-10 DIAGNOSIS — I10 Essential (primary) hypertension: Secondary | ICD-10-CM | POA: Diagnosis not present

## 2023-04-10 DIAGNOSIS — G8194 Hemiplegia, unspecified affecting left nondominant side: Secondary | ICD-10-CM | POA: Diagnosis not present

## 2023-04-10 MED ORDER — ORAL CARE MOUTH RINSE: 15.0000 mL | OROMUCOSAL | Status: AC | PRN

## 2023-04-10 MED ORDER — POLYVINYL ALCOHOL 1.4 % OP SOLN: 1.0000 [drp] | Freq: Four times a day (QID) | OPHTHALMIC | Status: AC | PRN

## 2023-04-10 NOTE — Discharge Summary (Signed)
Physician Discharge Summary  Hunter Bailey BMW:[id number] DOB: Dec 17, 1934 DOA: 03/18/2023  PCP: [facility]  Admit date: 03/18/2023 Discharge date: 04/10/2023  Admitted From: Home Disposition:  LTC with hospice  Recommendations for Outpatient Follow-up:  Per hospice providers   Home Health:NA  Equipment/Devices:None   Discharge Condition:Hospice  CODE STATUS:DNR  Diet recommendation: Comfort  Brief/Interim Summary:  89 year old gentleman with past medical history significant for HLD, HTN, CAD, BPH, H/o CVA w/ left hemiparesis.  Patient resides in independent living facility.  He was brought in with decreased p.o. liquid intake for the past 3 days. He was lethargic on admission.    In the ER patient's vital stable.  Sodium 130, potassium 3.3, creatinine 2.67 baseline normal, total protein greater than 12, hemoglobin 9.7 [baseline 11.8, done 12/09/2022], platelets 114 [baseline normal]. Calcium elevated to 13. EKG: Atrial fibrillation HR 66, QTc 468.    Patient is admitted for hypercalcemia, started on IV fluids.  Patient's protein is elevated at 12, kidney function worse than baseline.  Oncology evaluated the patient for suspicion of multiple myeloma.  Patient got bone marrow biopsy 03/28/23. His calcium is high started IV hydration, Calcitonin. Pamidronate, steroids per oncology team      Discharge Diagnoses:  Principal Problem:   Hypercalcemia Active Problems:   BPH (benign prostatic hyperplasia)   Coronary disease   Left hemiparesis (HCC)   Other vitamin B12 deficiency anemias   Type 2 diabetes mellitus without complications (HCC)   AKI (acute kidney injury) (HCC)   Hypokalemia   Hyponatremia   Anemia associated with chemotherapy   Protein-calorie malnutrition, severe   Multiple myeloma not having achieved remission (HCC)   Dehydration   Oral candida  Hypercalcemia  Acute anemia AKI Multiple myeloma confirmed Presented with weakness, lethargy.  Poor oral  intake Oncology evaluation is appreciated.  LDH 124, beta-2 microglobulin level ordered.  Elevated kappa light chain with increased light chain ratio. Protein >12 - protein electrophoresis pending. Hb around 8.  Calcium 9.9 following IV hydration, calcitonin therapy, pamidronate and Decadron ordered by oncology team Plan: Multiple myeloma has been confirmed but patient is not a candidate for chemotherapy and family does not want aggressive treatment at this time.  Discussed with palliative care.  Made full comfort measures on 1/23.  All medications not focused on patient comfort have been discontinued.  Patient's family has accepted bed at [facility].  Discharge Monday 1/27  Discharge Instructions  Discharge Instructions     Diet - low sodium heart healthy   Complete by: As directed    Increase activity slowly   Complete by: As directed    No wound care   Complete by: As directed       Allergies as of 04/10/2023       Reactions   Penicillins Other (See Comments)        Medication List     STOP taking these medications    aspirin 81 MG chewable tablet   atorvastatin 80 MG tablet Commonly known as: LIPITOR   Cholecalciferol 125 MCG (5000 UT) capsule   ciprofloxacin 500 MG tablet Commonly known as: Cipro   clopidogrel 75 MG tablet Commonly known as: PLAVIX   DSS 100 MG Caps   hydrochlorothiazide 12.5 MG capsule Commonly known as: MICROZIDE   lisinopril 2.5 MG tablet Commonly known as: ZESTRIL   metoprolol succinate 25 MG 24 hr tablet Commonly known as: TOPROL-XL   timolol 0.5 % ophthalmic solution Commonly known as: TIMOPTIC   traZODone 50  MG tablet Commonly known as: DESYREL       TAKE these medications    acetaminophen 325 MG tablet Commonly known as: TYLENOL Take 650 mg by mouth every 6 (six) hours as needed.   Antacid 200-200-20 MG/5ML suspension Generic drug: alum & mag hydroxide-simeth Take 30 mLs by mouth every 6 (six) hours as needed for  indigestion or heartburn.   latanoprost 0.005 % ophthalmic solution Commonly known as: XALATAN Place 1 drop into both eyes at bedtime.   mouth rinse Liqd solution 15 mLs by Mouth Rinse route as needed (for oral care).   polyvinyl alcohol 1.4 % ophthalmic solution Commonly known as: LIQUIFILM TEARS Place 1 drop into both eyes 4 (four) times daily as needed for dry eyes.        Allergies  Allergen Reactions   Penicillins Other (See Comments)    Consultations: Oncology Palliative care   Procedures/Studies: MR BRAIN WO CONTRAST Result Date: 03/30/2023 CLINICAL DATA:  Acute neurologic deficit EXAM: MRI HEAD WITHOUT CONTRAST TECHNIQUE: Multiplanar, multiecho pulse sequences of the brain and surrounding structures were obtained without intravenous contrast. COMPARISON:  None Available. FINDINGS: Brain: No acute infarct, mass effect or extra-axial collection. Chronic blood products at the posterior right hemisphere. There is confluent hyperintense T2-weighted signal within the white matter. Generalized volume loss. Old posterior right MCA territory infarct old left frontal and parietal subcortical infarcts. The midline structures are normal. Vascular: Normal flow voids. Skull and upper cervical spine: Normal calvarium and skull base. Visualized upper cervical spine and soft tissues are normal. Sinuses/Orbits:No paranasal sinus fluid levels or advanced mucosal thickening. No mastoid or middle ear effusion. Normal orbits. IMPRESSION: 1. No acute intracranial abnormality. 2. Multiple old infarcts and sequelae of chronic small vessel disease. Electronically Signed   By: Deatra Robinson M.D.   On: 03/30/2023 04:00   CT HEAD WO CONTRAST ( ) Result Date: 03/29/2023 CLINICAL DATA:  Left hemiparesis EXAM: CT HEAD WITHOUT CONTRAST TECHNIQUE: Contiguous axial images were obtained from the base of the skull through the vertex without intravenous contrast. RADIATION DOSE REDUCTION: This exam was performed  according to the departmental dose-optimization program which includes automated exposure control, adjustment of the mA and/or kV according to patient size and/or use of iterative reconstruction technique. COMPARISON:  None Available. FINDINGS: Brain: Hypodensity in the right posterior frontal and anterior parietal lobe, some of which appears chronic, but some of which is age indeterminate and may be acute to subacute, particularly posteriorly (series 4, image 50). Additional chronic encephalomalacia in the posterior left frontal lobe and right basal ganglia no evidence of acute hemorrhage, mass, mass effect, or midline shift. No hydrocephalus or extra-axial fluid collection. Vascular: No hyperdense vessel. Skull: Negative for fracture or focal lesion. Sinuses/Orbits: Minimal mucosal thickening in the ethmoid air cells. No acute finding in the orbits. Other: The mastoid air cells are well aerated. IMPRESSION: 1. Hypodensity in the right posterior frontal and anterior parietal lobe, some of which appears chronic, but some of which is age indeterminate and may be acute to subacute, particularly posteriorly. MRI is recommended for further evaluation. 2. Additional chronic encephalomalacia in the posterior left frontal lobe and right basal ganglia. These results will be called to the ordering clinician or representative by the Radiologist Assistant, and communication documented in the PACS or [employer]. Electronically Signed   By: Wiliam Ke M.D.   On: 03/29/2023 17:15   CT BONE MARROW BIOPSY & ASPIRATION Result Date: 03/28/2023 INDICATION: 89 year old male referred for bone marrow biopsy  EXAM: CT BONE MARROW BIOPSY AND ASPIRATION MEDICATIONS: None. ANESTHESIA/SEDATION: Moderate (conscious) sedation was employed during this procedure. A total of Versed 0.5 mg and Fentanyl 25 mcg was administered intravenously. Moderate Sedation Time: 14 minutes. The patient's level of consciousness and vital signs were  monitored continuously by radiology nursing throughout the procedure under my direct supervision. FLUOROSCOPY TIME:  CT COMPLICATIONS: None PROCEDURE: Informed written consent was obtained from the patient's family after a thorough discussion of the procedural risks, benefits and alternatives. All questions were addressed. Maximal Sterile Barrier Technique was utilized including caps, mask, sterile gowns, sterile gloves, sterile drape, hand hygiene and skin antiseptic. A timeout was performed prior to the initiation of the procedure. Patient was positioned right decubitus on the CT gantry table, ultimately rolled into a right posterior oblique position. Scout CT of the pelvis was performed for surgical planning purposes. The posterior pelvis was prepped with Chlorhexidine in a sterile fashion, and a sterile drape was applied covering the operative field. A sterile gown and sterile gloves were used for the procedure. Local anesthesia was provided with 1% Lidocaine. Posterior right iliac bone was targeted for biopsy. The skin and subcutaneous tissues were infiltrated with 1% lidocaine without epinephrine. A small stab incision was made with an 11 blade scalpel, and an 11 gauge Murphy needle was advanced with CT guidance to the posterior cortex. Manual forced was used to advance the needle through the posterior cortex and the stylet was removed. A bone marrow aspirate was retrieved and passed to a cytotechnologist in the room. The Murphy needle was then advanced without the stylet for a core biopsy. The core biopsy was retrieved and also passed to a cytotechnologist. Manual pressure was used for hemostasis and a sterile dressing was placed. No complications were encountered no significant blood loss was encountered. Patient tolerated the procedure well and remained hemodynamically stable throughout. IMPRESSION: Status post image guided bone marrow biopsy. Signed, Yvone Neu. Miachel Roux, RPVI [facility] Electronically Signed   By: Gilmer Mor D.O.   On: 03/28/2023 13:02   DG Bone Survey Met Result Date: 03/24/2023 CLINICAL DATA:  Elevated immunoglobulin free light chain level. Hypercalcemia. EXAM: METASTATIC BONE SURVEY COMPARISON:  CT abdomen pelvis dated August 16, 2021. Chest x-ray dated June 09, 2021. FINDINGS: No sclerotic or lytic lesion in the visualized axial or appendicular skeleton. Degenerative changes of the spine, shoulders, hips, and knees. IMPRESSION: 1. No evident bony lesion. Electronically Signed   By: Obie Dredge M.D.   On: 03/24/2023 16:55   US RENAL Result Date: 03/23/2023 CLINICAL DATA:  Acute kidney injury EXAM: RENAL / URINARY TRACT ULTRASOUND COMPLETE COMPARISON:  Ultrasound 2015.  CT renal stone 08/16/2021. FINDINGS: Right Kidney: Renal measurements: 11.7 x 6.0 x 5.9 cm = volume: 215 mL. No collecting system dilatation or perinephric fluid. Upper pole renal cyst measuring 2.9 x 3.0 x 3.0 cm. Two thousand fifteen this has smaller at 15 mm. This has seen on prior CT as well. Left Kidney: Imaging of the left kidney. Patient declined additional images as per the sonographer of the left kidney due to pain. Bladder: Underdistended. Diffuse wall thickening. Large prostate. Prostate was not able to be measured by the sonographer at this time. Ureteral jets were seen. Other: None. IMPRESSION: Limited examination as the patient did not want to continue due to pain. Left kidney is not imaged. No right-sided renal collecting system dilatation. Persistent upper pole right-sided renal cysts. Enlarged prostate with mass  effect along the base of the bladder. Bladder wall thickening and trabeculation. Electronically Signed   By: Karen Kays M.D.   On: 03/23/2023 16:59   DG Swallowing Func-Speech Pathology Result Date: 03/21/2023 Table formatting from the original result was not included. Modified Barium Swallow Study Patient Details  Name: Hunter Bailey MRN: [id number] Date of Birth: 16-Jan-1935 Today's Date: 03/21/2023 HPI/PMH: HPI: Pt is an 88yoM who [facility] on [facility]23 after 3 days of decreased PO intake, pt now lethargic and appears dehydrated. Pt with elevated Ca++ 13.  Pt resides at the [place] of [address] PMH: CVA w/ Left hemiplegia.   CXR: No active disease.  Of Note: Pt indicated he had been being tx'd for UTI "problems" at the NH in the past month. Clinical Impression: Clinical Impression: Pt presents with moderate oropharyngeal dysphagia that places him at a high risk of aspiration when consuming thin liquids and nectar thick liquids via cup sips.     Pt's oral phase is c/b repetitive tongue movements during bolus propulsion and overall decreased mastication observed.     Pt's pharyngeal phase is c/b severely delayed swallow initiation with boluses resting in the pyriform sinuses for > 5 seconds. When consuming cup sips of thin liquids and nectar thick liquids, boluses spill into airway d/t larger bolus size and inability to contain them within his pyriform sinuses. Aspiration is reduced when consuming thin liquids and nectar thick liquids via spoon.     Given the above, recommend dysphagia 1 diet with thin liquids via spoon, medicine crushed in applesause with strict adherence to supervision and aspiration precautions. Factors that may increase risk of adverse event in presence of aspiration Rubye Oaks & Clearance Coots 2021): Factors that may increase risk of adverse event in presence of aspiration Rubye Oaks & Clearance Coots 2021): Reduced cognitive function; Limited mobility; Dependence for feeding and/or oral hygiene Recommendations/Plan: Swallowing Evaluation Recommendations Swallowing Evaluation Recommendations Recommendations: PO diet PO Diet Recommendation: Dysphagia 1 (Pureed); Thin liquids (Level 0) Liquid Administration via: Spoon Medication Administration: Crushed with puree Supervision: Staff to assist with self-feeding; Full  supervision/cueing for swallowing strategies Swallowing strategies  : Minimize environmental distractions; Slow rate; Small bites/sips Postural changes: Position pt fully upright for meals; Stay upright 30-60 min after meals Oral care recommendations: Oral care BID (2x/day) Caregiver Recommendations: -- (TBD) Treatment Plan Treatment Plan Treatment recommendations: No treatment recommended at this time Follow-up recommendations: No SLP follow up Functional status assessment: Patient has had a recent decline in their functional status and/or demonstrates limited ability to make significant improvements in function in a reasonable and predictable amount of time. Recommendations Recommendations for follow up therapy are one component of a multi-disciplinary discharge planning process, led by the attending physician.  Recommendations may be updated based on patient status, additional functional criteria and insurance authorization. Assessment: Orofacial Exam: Orofacial Exam Oral Cavity: Oral Hygiene: WFL Oral Cavity - Dentition: Poor condition; Missing dentition Orofacial Anatomy: WFL Oral Motor/Sensory Function: Unable to test (left side facial weakness) Anatomy: Anatomy: WFL; Suspected cervical osteophytes Boluses Administered: Boluses Administered Boluses Administered: Thin liquids (Level 0); Mildly thick liquids (Level 2, nectar thick); Puree  Oral Impairment Domain: Oral Impairment Domain Lip Closure: Escape beyond mid-chin (on left) Tongue control during bolus hold: Not tested Bolus preparation/mastication: Minimal chewing/mashing with majority of bolus unchewed Bolus transport/lingual motion: Repetitive/disorganized tongue motion Oral residue: Trace residue lining oral structures Location of oral residue : Floor of mouth; Tongue; Palate Initiation of pharyngeal swallow : Pyriform sinuses  Pharyngeal Impairment Domain: Pharyngeal  Impairment Domain Soft palate elevation: No bolus between soft palate (SP)/pharyngeal  wall (PW) Laryngeal elevation: Partial superior movement of thyroid cartilage/partial approximation of arytenoids to epiglottic petiole Anterior hyoid excursion: Partial anterior movement Epiglottic movement: Complete inversion Laryngeal vestibule closure: Incomplete, narrow column air/contrast in laryngeal vestibule Pharyngeal stripping wave : Present - complete Pharyngeal contraction (A/P view only): Complete Pharyngoesophageal segment opening: Complete distension and complete duration, no obstruction of flow Tongue base retraction: No contrast between tongue base and posterior pharyngeal wall (PPW) Pharyngeal residue: Complete pharyngeal clearance  Esophageal Impairment Domain: Esophageal Impairment Domain Esophageal clearance upright position: Complete clearance, esophageal coating Pill: No data recorded Penetration/Aspiration Scale Score: Penetration/Aspiration Scale Score 1.  Material does not enter airway: Thin liquids (Level 0); Mildly thick liquids (Level 2, nectar thick); Puree 8.  Material enters airway, passes BELOW cords without attempt by patient to eject out (silent aspiration) : Thin liquids (Level 0); Mildly thick liquids (Level 2, nectar thick) (vis cup sips) Compensatory Strategies: Compensatory Strategies Compensatory strategies: No   General Information: Caregiver present: No  Diet Prior to this Study: NPO   Temperature : Normal   Respiratory Status: WFL   Supplemental O2: None (Room air)   History of Recent Intubation: No  Behavior/Cognition: Alert; Distractible; Requires cueing; Doesn't follow directions Self-Feeding Abilities: Needs set-up for self-feeding; Needs assist with self-feeding; Needs hand-over-hand assist for feeding Baseline vocal quality/speech: Normal Volitional Cough: Unable to elicit Volitional Swallow: Unable to elicit Exam Limitations: No limitations Goal Planning: Prognosis for improved oropharyngeal function: Fair Barriers to Reach Goals: Cognitive deficits; Time post  onset; Severity of deficits Barriers/Prognosis Comment: old R CVA; missing/poor Dentition; LUE weakness Patient/Family Stated Goal: none stated Consulted and agree with results and recommendations: Patient; Physician; Nurse Pain: Pain Assessment Pain Assessment: Faces Faces Pain Scale: 4 Pain Location: R shoulder Pain Descriptors / Indicators: Aching; Sore Pain Intervention(s): Monitored during session; Repositioned End of Session: Start Time:SLP Start Time (ACUTE ONLY): 1135 Stop Time: SLP Stop Time (ACUTE ONLY): 1155 Time Calculation:SLP Time Calculation (min) (ACUTE ONLY): 20 min Charges: SLP Evaluations $ SLP Speech Visit: 1 Visit SLP Evaluations $BSS Swallow: 1 Procedure $MBS Swallow: 1 Procedure SLP visit diagnosis: SLP Visit Diagnosis: Dysphagia, oropharyngeal phase (R13.12) Past Medical History: Past Medical History: Diagnosis Date  Angina of effort (HCC)   BPH (benign prostatic hyperplasia)   Coronary artery disease   Encephalopathy   Hemiparesis (HCC)   Hyperlipidemia   Hypertension   MI (myocardial infarction) (HCC)   Stroke [facility])  Past Surgical History: Past Surgical History: Procedure Laterality Date  CORONARY/GRAFT ACUTE MI REVASCULARIZATION N/A 06/09/2021  Procedure: Coronary/Graft Acute MI Revascularization;  Surgeon: Marcina Millard, MD;  Location: [facility] INVASIVE CV LAB;  Service: Cardiovascular;  Laterality: N/A;  LEFT HEART CATH AND CORONARY ANGIOGRAPHY N/A 06/09/2021  Procedure: LEFT HEART CATH AND CORONARY ANGIOGRAPHY;  Surgeon: Marcina Millard, MD;  Location: [facility] INVASIVE CV LAB;  Service: Cardiovascular;  Laterality: N/A; Happi Overton 03/21/2023, 12:25 PM     Subjective: Seen and examined on day of dc.  Appropriate for discharge to LTC with Hospice services.  Discharge Exam: Vitals:   04/09/23 0310 04/09/23 1954  BP: 130/73 132/71  Pulse:  70  Resp: 16 18  Temp: 97.7 F (36.5 C)   SpO2: 93% 90%   Vitals:   04/08/23 1439 04/08/23 2003 04/09/23 0310 04/09/23 1954  BP:  130/67 132/69 130/73 132/71  Pulse: 73 67  70  Resp: 18 20 16 18   Temp: 98.3 F (36.8 C) 98.1  F (36.7 C) 97.7 F (36.5 C)   TempSrc:  Axillary    SpO2: 99% 97% 93% 90%  Weight:      Height:        General: Pt is alert, awake, not in acute distress Cardiovascular: RRR, S1/S2 +, no rubs, no gallops Respiratory: CTA bilaterally, no wheezing, no rhonchi Abdominal: Soft, NT, ND, bowel sounds + Extremities: no edema, no cyanosis    The results of significant diagnostics from this hospitalization (including imaging, microbiology, ancillary and laboratory) are listed below for reference.     Microbiology: No results found for this or any previous visit (from the past 240 hours).   Labs: BNP (last 3 results) No results for input(s): "BNP" in the last 8760 hours. Basic Metabolic Panel: No results for input(s): "NA", "K", "CL", "CO2", "GLUCOSE", "BUN", "CREATININE", "CALCIUM", "MG", "PHOS" in the last 168 hours. Liver Function Tests: No results for input(s): "AST", "ALT", "ALKPHOS", "BILITOT", "PROT", "ALBUMIN" in the last 168 hours. No results for input(s): "LIPASE", "AMYLASE" in the last 168 hours. No results for input(s): "AMMONIA" in the last 168 hours. CBC: No results for input(s): "WBC", "NEUTROABS", "HGB", "HCT", "MCV", "PLT" in the last 168 hours. Cardiac Enzymes: No results for input(s): "CKTOTAL", "CKMB", "CKMBINDEX", "TROPONINI" in the last 168 hours. BNP: Invalid input(s): "POCBNP" CBG: Recent Labs  Lab 04/05/23 0555 04/05/23 0821 04/05/23 1417 04/06/23 0018 04/06/23 0549  GLUCAP 123* 92 83 126* 89   D-Dimer No results for input(s): "DDIMER" in the last 72 hours. Hgb A1c No results for input(s): "HGBA1C" in the last 72 hours. Lipid Profile No results for input(s): "CHOL", "HDL", "LDLCALC", "TRIG", "CHOLHDL", "LDLDIRECT" in the last 72 hours. Thyroid function studies No results for input(s): "TSH", "T4TOTAL", "T3FREE", "THYROIDAB" in the last 72  hours.  Invalid input(s): "FREET3" Anemia work up No results for input(s): "VITAMINB12", "FOLATE", "FERRITIN", "TIBC", "IRON", "RETICCTPCT" in the last 72 hours. Urinalysis    Component Value Date/Time   COLORURINE YELLOW (A) 03/19/2023 0339   APPEARANCEUR CLOUDY (A) 03/19/2023 0339   LABSPEC 1.019 03/19/2023 0339   PHURINE 5.0 03/19/2023 0339   GLUCOSEU NEGATIVE 03/19/2023 0339   HGBUR MODERATE (A) 03/19/2023 0339   BILIRUBINUR NEGATIVE 03/19/2023 0339   KETONESUR NEGATIVE 03/19/2023 0339   PROTEINUR 100 (A) 03/19/2023 0339   NITRITE NEGATIVE 03/19/2023 0339   LEUKOCYTESUR NEGATIVE 03/19/2023 0339   Sepsis Labs No results for input(s): "WBC" in the last 168 hours.  Invalid input(s): "PROCALCITONIN", "LACTICIDVEN" Microbiology No results found for this or any previous visit (from the past 240 hours).   Time coordinating discharge: Over 30 minutes  SIGNED:   Tresa Moore, MD  [facility] 04/10/2023, 9:02 AM Pager   If 7PM-7AM, please contact night-coverage

## 2023-04-10 NOTE — Progress Notes (Signed)
Report given to [facility] at [facility] [phone] reflecting patient's current status.

## 2023-04-10 NOTE — Plan of Care (Signed)
  Problem: Education: Goal: Knowledge of General Education information will improve Description: Including pain rating scale, medication(s)/side effects and non-pharmacologic comfort measures Outcome: Adequate for Discharge   Problem: Health Behavior/Discharge Planning: Goal: Ability to manage health-related needs will improve Outcome: Adequate for Discharge   Problem: Clinical Measurements: Goal: Ability to maintain clinical measurements within normal limits will improve Outcome: Adequate for Discharge Goal: Will remain free from infection Outcome: Adequate for Discharge Goal: Diagnostic test results will improve Outcome: Adequate for Discharge Goal: Respiratory complications will improve Outcome: Adequate for Discharge Goal: Cardiovascular complication will be avoided Outcome: Adequate for Discharge   Problem: Activity: Goal: Risk for activity intolerance will decrease Outcome: Adequate for Discharge   Problem: Nutrition: Goal: Adequate nutrition will be maintained Outcome: Adequate for Discharge   Problem: Coping: Goal: Level of anxiety will decrease Outcome: Adequate for Discharge   Problem: Elimination: Goal: Will not experience complications related to bowel motility Outcome: Adequate for Discharge Goal: Will not experience complications related to urinary retention Outcome: Adequate for Discharge   Problem: Pain Management: Goal: General experience of comfort will improve Outcome: Adequate for Discharge   Problem: Safety: Goal: Ability to remain free from injury will improve Outcome: Adequate for Discharge   Problem: Skin Integrity: Goal: Risk for impaired skin integrity will decrease Outcome: Adequate for Discharge   Problem: Education: Goal: Knowledge of the prescribed therapeutic regimen will improve Outcome: Adequate for Discharge   Problem: Coping: Goal: Ability to identify and develop effective coping behavior will improve Outcome: Adequate for  Discharge   Problem: Clinical Measurements: Goal: Quality of life will improve Outcome: Adequate for Discharge   Problem: Respiratory: Goal: Verbalizations of increased ease of respirations will increase Outcome: Adequate for Discharge   Problem: Role Relationship: Goal: Family's ability to cope with current situation will improve Outcome: Adequate for Discharge Goal: Ability to verbalize concerns, feelings, and thoughts to partner or family member will improve Outcome: Adequate for Discharge   Problem: Pain Management: Goal: Satisfaction with pain management regimen will improve Outcome: Adequate for Discharge

## 2023-04-10 NOTE — TOC Transition Note (Signed)
Transition of Care [facility]) - Discharge Note   Patient Details  Name: Hunter Bailey MRN: [id number] Date of Birth: [birth year]  [facility] CM/SW Contact:  Chapman Fitch, RN Phone Number: 04/10/2023, 10:08 AM   Clinical Narrative:      Patient will DC to:[facility] Anticipated DC date: 04/10/23  Family notified: [name] [id number]  Per MD patient ready for DC to . RN, , patient's family, and facility notified of DC. Discharge Summary sent to facility. RN given number for report. DC packet on chart. Ambulance transport requested for patient.   Toniann Fail at facility states that they will arranged Hospice services through [facility] when patient admits to the facility  [facility] signing off.        Patient Goals and CMS Choice            Discharge Placement                       Discharge Plan and Services Additional resources added to the After Visit Summary for                                       Social Drivers of Health (SDOH) Interventions SDOH Screenings   Food Insecurity: Patient Unable To Answer (03/22/2023)  Housing: Patient Unable To Answer (03/22/2023)  Transportation Needs: Patient Unable To Answer (03/22/2023)  Utilities: Patient Unable To Answer (03/22/2023)  Social Connections: Unknown (03/22/2023)  Tobacco Use: Medium Risk (03/23/2023)     Readmission Risk Interventions     No data to display

## 2023-04-10 NOTE — Plan of Care (Signed)
  Problem: Pain Management: Goal: General experience of comfort will improve Outcome: Progressing   Problem: Education: Goal: Knowledge of General Education information will improve Description: Including pain rating scale, medication(s)/side effects and non-pharmacologic comfort measures Outcome: Not Progressing   Problem: Clinical Measurements: Goal: Ability to maintain clinical measurements within normal limits will improve Outcome: Not Progressing   Problem: Activity: Goal: Risk for activity intolerance will decrease Outcome: Not Progressing   Problem: Nutrition: Goal: Adequate nutrition will be maintained Outcome: Not Progressing

## 2023-04-12 DIAGNOSIS — E785 Hyperlipidemia, unspecified: Secondary | ICD-10-CM | POA: Diagnosis not present

## 2023-04-12 DIAGNOSIS — I69352 Hemiplegia and hemiparesis following cerebral infarction affecting left dominant side: Secondary | ICD-10-CM | POA: Diagnosis not present

## 2023-04-12 DIAGNOSIS — I10 Essential (primary) hypertension: Secondary | ICD-10-CM | POA: Diagnosis not present

## 2023-05-13 DEATH — deceased

## 2024-01-19 IMAGING — DX DG CHEST 1V PORT
1 series · 1 of 1 positions shown · non-contrast
Comparison: Chest radiograph dated 06/24/2004.

CLINICAL DATA: STEMI.

EXAM:
PORTABLE CHEST 1 VIEW

[chest ap]
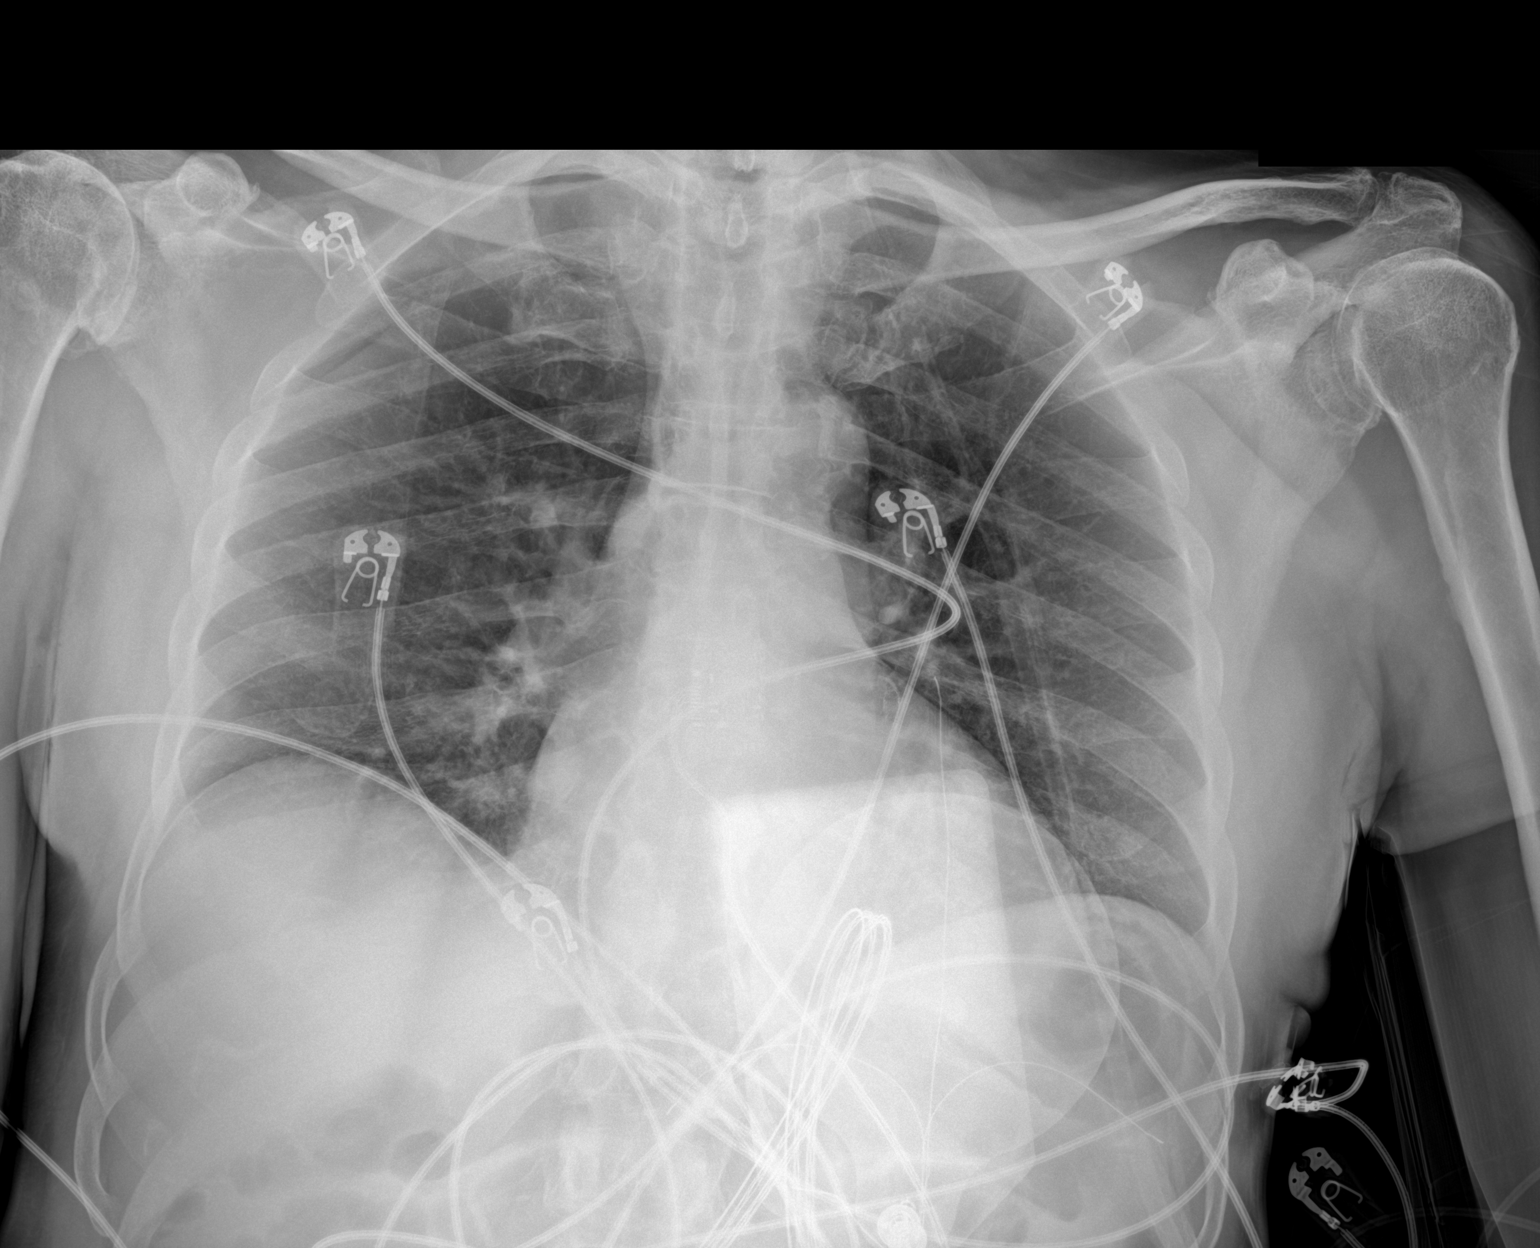

[1 of 1 positions shown; findings below may reference images not displayed]

FINDINGS: The lungs are clear. There is no pleural effusion pneumothorax. The
cardiac silhouette is within normal limits. No acute osseous
pathology. Degenerative changes of the shoulder.
IMPRESSION: No active disease.

## 2024-03-27 IMAGING — CT CT RENAL STONE PROTOCOL
2 of 4 series · 16 of 46 positions shown, 18 images · non-contrast
Comparison: Ultrasound 11/19/2013

CLINICAL DATA: Flank pain, kidney stone suspected. Painful
urination. Hematuria.



[Series 2: ap without · axial · non-contrast · 0.77mm/px · z∈[+843,+1263]mm · 13 of 96 slices shown, 15 images]
[im 6/96  soft-tissue]
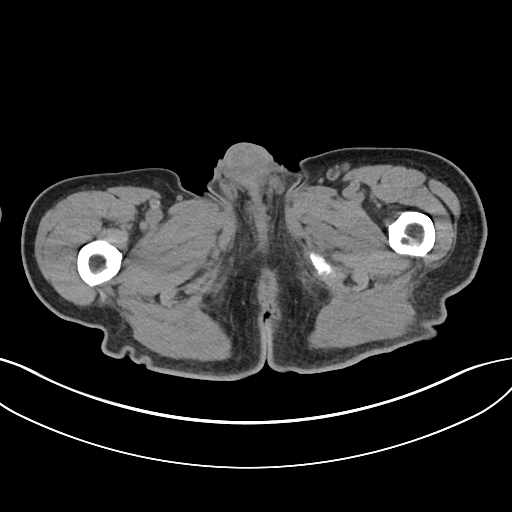
[im 6/96  bone]
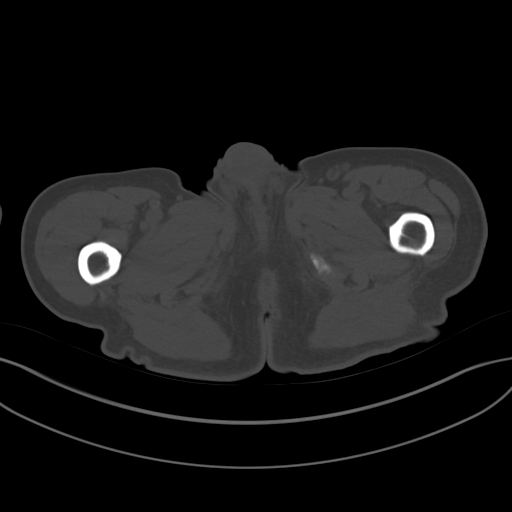
[im 12/96  soft-tissue]
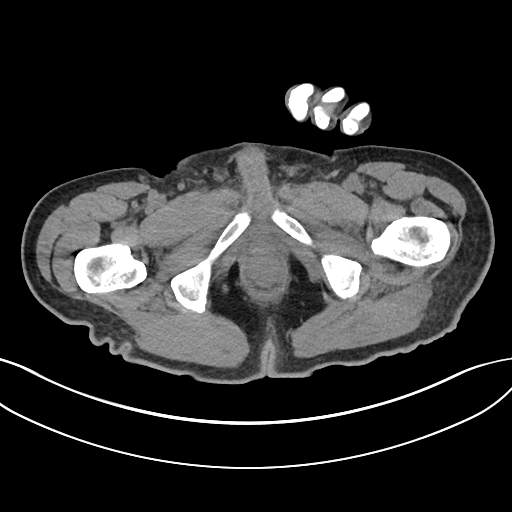
[im 23/96  soft-tissue]
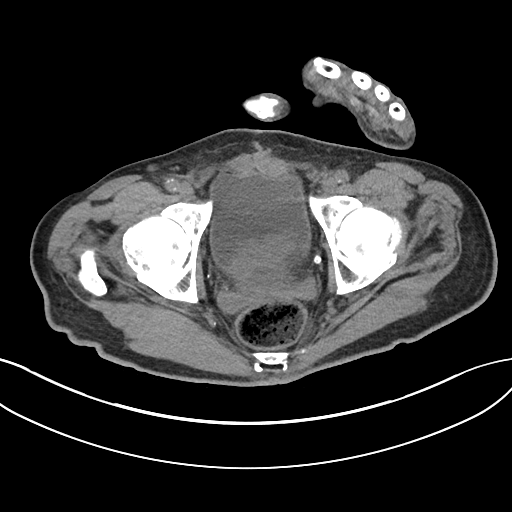
[im 28/96  soft-tissue]
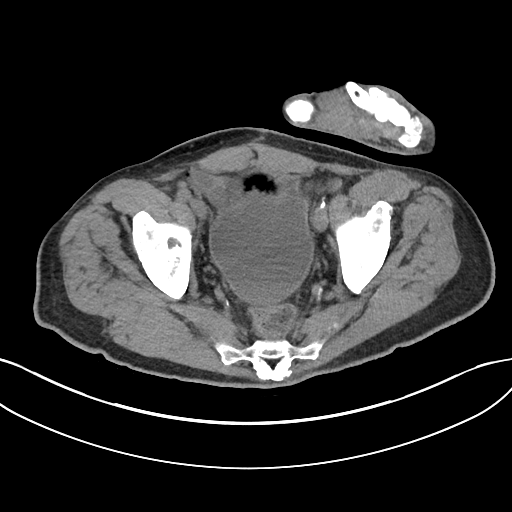
[im 34/96  soft-tissue]
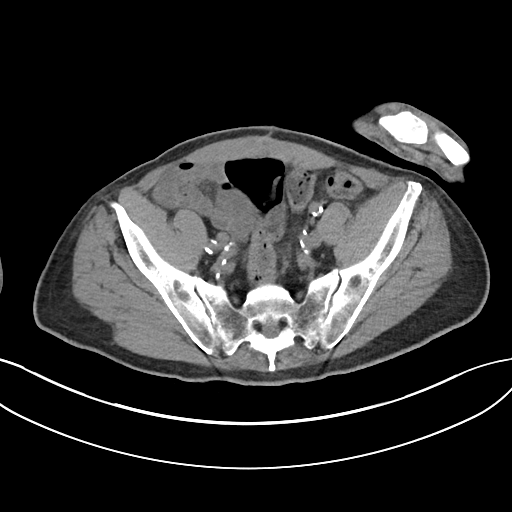
[im 40/96  soft-tissue]
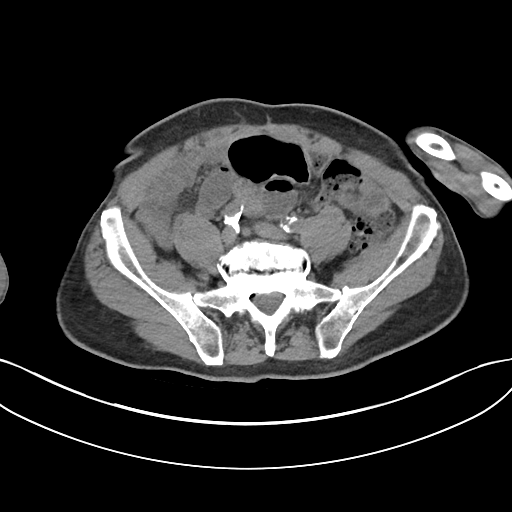
[im 51/96  soft-tissue]
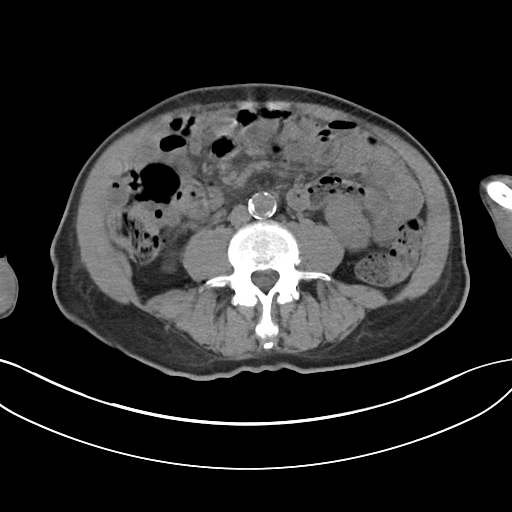
[im 56/96  soft-tissue]
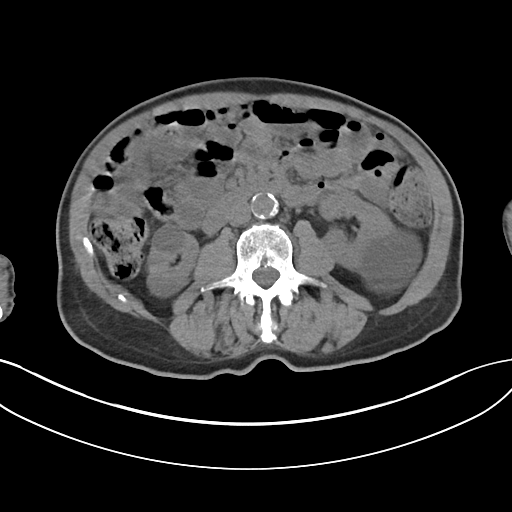
[im 62/96  soft-tissue]
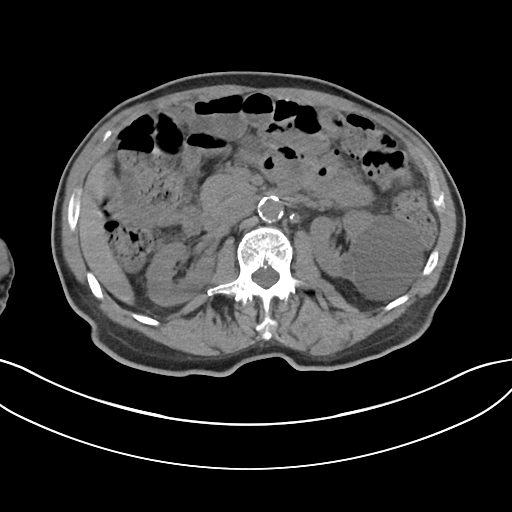
[im 62/96  bone]
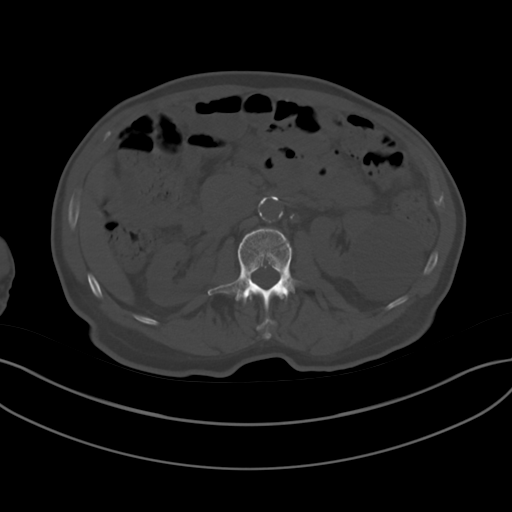
[im 68/96  soft-tissue]
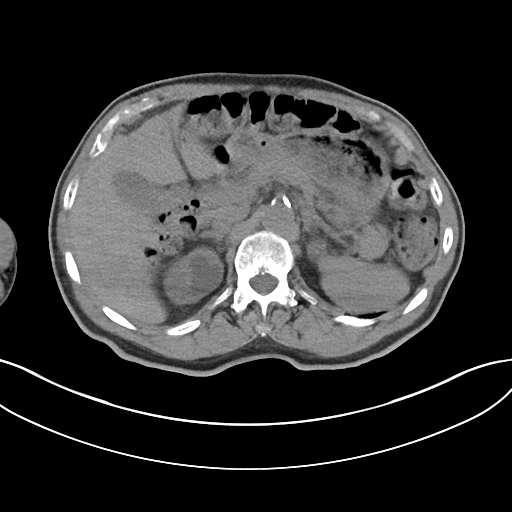
[im 73/96  soft-tissue]
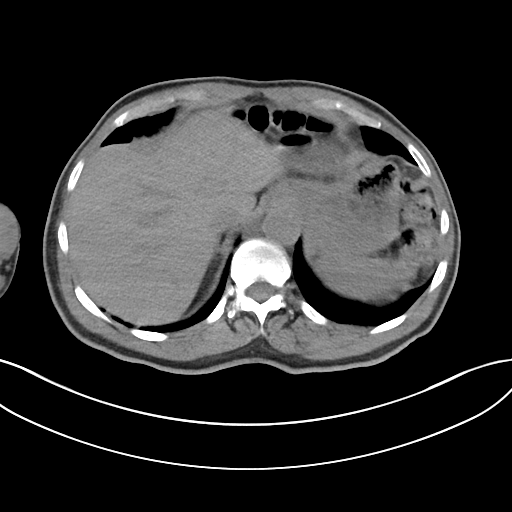
[im 84/96  soft-tissue]
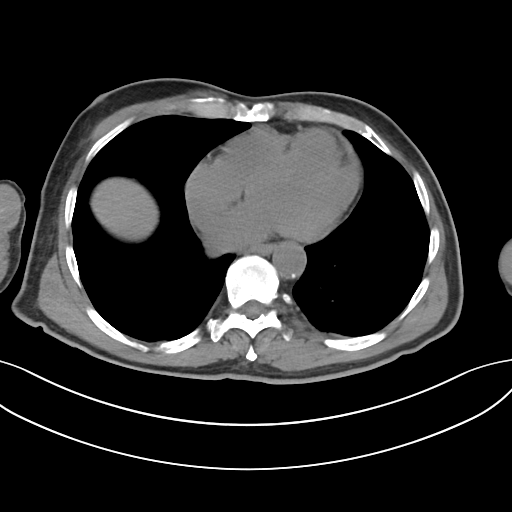
[im 90/96  soft-tissue]
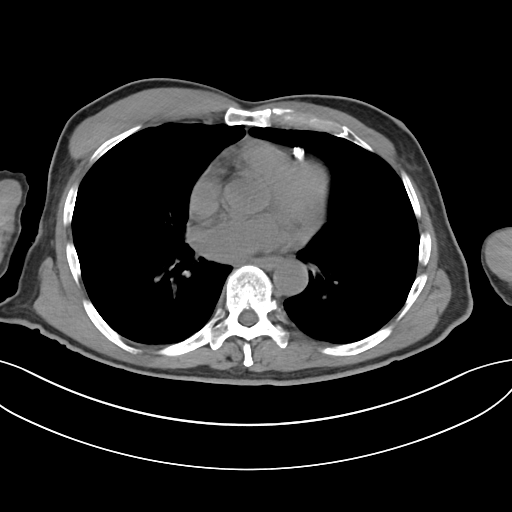

[Series 4: cor · coronal · 0.78mm/px · 3 of 84 slices shown]
[im 28/84  soft-tissue]
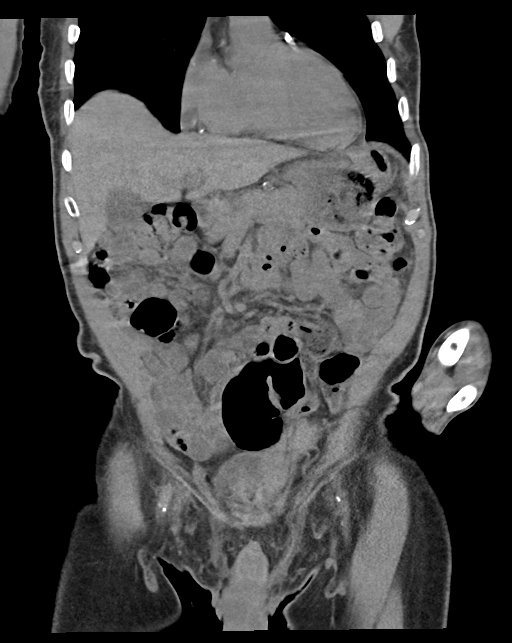
[im 37/84  soft-tissue]
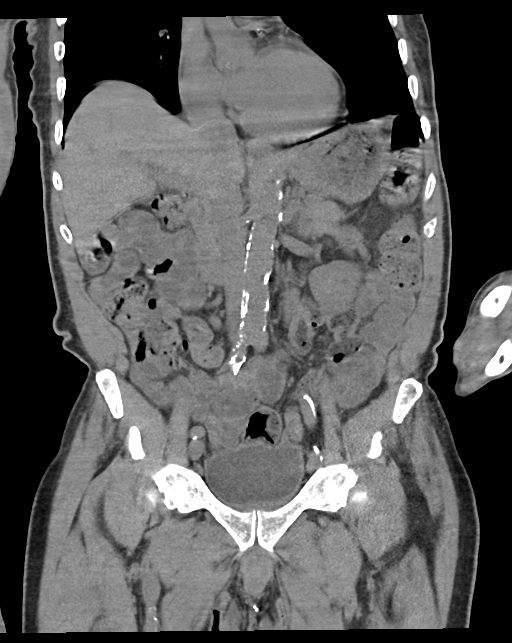
[im 47/84  soft-tissue]
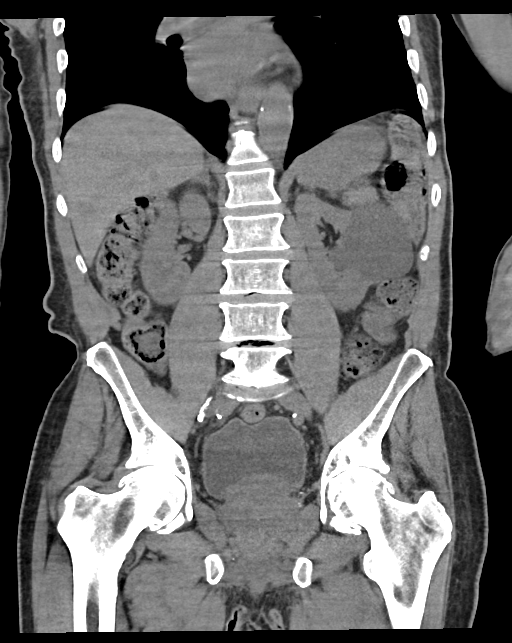

[16 of 46 positions shown; findings below may reference images not displayed]

FINDINGS: Lower chest: Mild scarring or atelectasis at the lung bases.
Coronary artery calcification and/or stents. Aortic atherosclerotic
calcification.

Hepatobiliary: Normal without contrast.  No calcified gallstones.

Pancreas: Normal

Spleen: Normal

Adrenals/Urinary Tract: Adrenal glands are normal. Bilateral renal
cysts as seen previously, the largest in the lateral left kidney
measuring 6.6 cm in diameter. No evidence of stone or
hydronephrosis. Abnormal appearance of the bladder which could be
due to blood clots in the bladder, but a urothelial/inferior bladder
mass is not excluded.

Stomach/Bowel: Stomach and small intestine are normal. Colon is
normal.

Vascular/Lymphatic: Aortic atherosclerosis. No aneurysm. IVC is
normal. No adenopathy.

Reproductive: Enlarged prostate.

Other: No free fluid or air.

Musculoskeletal: Ordinary chronic lumbar degenerative changes and
osteoarthritis of the hips.
IMPRESSION: No evidence of renal obstruction or stone disease. Bilateral renal
cysts.

Abnormal appearance of the bladder with hyperdense material along
the bladder floor that could represent intraluminal blood clot or
possibly a bladder wall mass lesion.

Enlarged prostate.
